# Patient Record
Sex: Female | Born: 1960
Health system: Southern US, Community
[De-identification: ages and names within clinical notes are randomized; demographics above are authoritative.]

## PROBLEM LIST (undated history)

## (undated) DIAGNOSIS — T7840XA Allergy, unspecified, initial encounter: Secondary | ICD-10-CM

## (undated) DIAGNOSIS — M810 Age-related osteoporosis without current pathological fracture: Secondary | ICD-10-CM

## (undated) HISTORY — DX: Allergy, unspecified, initial encounter: T78.40XA

## (undated) HISTORY — PX: OTHER SURGICAL HISTORY: SHX169

## (undated) HISTORY — PX: TOE SURGERY: SHX1073

---

## 2012-11-22 ENCOUNTER — Emergency Department (HOSPITAL_BASED_OUTPATIENT_CLINIC_OR_DEPARTMENT_OTHER): Payer: 59

## 2012-11-22 ENCOUNTER — Emergency Department (HOSPITAL_BASED_OUTPATIENT_CLINIC_OR_DEPARTMENT_OTHER)
Admission: EM | Admit: 2012-11-22 | Discharge: 2012-11-22 | Disposition: A | Discharge status: Home / Self Care | Payer: 59 | Attending: Emergency Medicine | Admitting: Emergency Medicine

## 2012-11-22 ENCOUNTER — Encounter (HOSPITAL_BASED_OUTPATIENT_CLINIC_OR_DEPARTMENT_OTHER): Payer: Self-pay | Admitting: *Deleted

## 2012-11-22 DIAGNOSIS — S93401A Sprain of unspecified ligament of right ankle, initial encounter: Secondary | ICD-10-CM

## 2012-11-22 DIAGNOSIS — X500XXA Overexertion from strenuous movement or load, initial encounter: Secondary | ICD-10-CM | POA: Insufficient documentation

## 2012-11-22 DIAGNOSIS — S93409A Sprain of unspecified ligament of unspecified ankle, initial encounter: Secondary | ICD-10-CM | POA: Insufficient documentation

## 2012-11-22 DIAGNOSIS — Y990 Civilian activity done for income or pay: Secondary | ICD-10-CM | POA: Insufficient documentation

## 2012-11-22 DIAGNOSIS — Y9389 Activity, other specified: Secondary | ICD-10-CM | POA: Insufficient documentation

## 2012-11-22 DIAGNOSIS — Y92009 Unspecified place in unspecified non-institutional (private) residence as the place of occurrence of the external cause: Secondary | ICD-10-CM | POA: Insufficient documentation

## 2012-11-22 MED ORDER — HYDROCODONE-ACETAMINOPHEN 5-325 MG PO TABS: 1.0000 | ORAL_TABLET | Freq: Four times a day (QID) | ORAL | Status: DC | PRN

## 2012-11-22 NOTE — ED Notes (Signed)
Pt sts she stepped on the edge of a metal object at work and twisted her right ankle. Same is now swollen and painful.

## 2012-11-22 NOTE — ED Provider Notes (Signed)
History     CSN: [id number]  Arrival date & time 11/22/12  0417   First MD Initiated Contact with Patient 11/22/12 [phone]      Chief Complaint  Patient presents with  . Ankle Injury    (Consider location/radiation/quality/duration/timing/severity/associated sxs/prior treatment) HPI This is a 52 year old woman twisted her right ankle at work several hours ago. It was initially not very painful but when she got home and took off her shoes she found it very painful to walk. The pain is moderate to severe, worse when standing. It is primarily located around the lateral malleolus with associated swelling. There is no numbness or motor dysfunction distally. She denies other injury.  History reviewed. No pertinent past medical history.  History reviewed. No pertinent past surgical history.  No family history on file.  History  Substance Use Topics  . Smoking status: Never Smoker   . Smokeless tobacco: Not on file  . Alcohol Use: Yes    OB History   Grav Para Term Preterm Abortions TAB SAB Ect Mult Living                  Review of Systems  All other systems reviewed and are negative.    Allergies  Penicillins  Home Medications  No current outpatient prescriptions on file.  BP 109/66  Pulse 74  Temp(Src) 98.1 F (36.7 C) (Oral)  Resp 16  Ht 5\' 4"  (1.626 m)  Wt 140 lb (63.504 kg)  BMI 24.02 kg/m2  SpO2 97%  Physical Exam General: Well-developed, well-nourished female in no acute distress; appearance consistent with age of record HENT: normocephalic, atraumatic Eyes: Normal appearance Neck: supple Heart: regular rate and rhythm; no murmurs, rubs or gallops Lungs: Normal respiratory effort and excursion Abdomen: soft; nondistended Extremities: Swelling and tenderness over right lateral malleolus with decreased range of motion of right ankle due to pain, right foot distally neurovascularly intact Neurologic: Awake, alert and oriented; motor function intact in all  extremities and symmetric; no facial droop Skin: Warm and dry Psychiatric: Normal mood and affect    ED Course  Procedures (including critical care time)     MDM  Nursing notes and vitals signs, including pulse oximetry, reviewed.  Summary of this visit's results, reviewed by myself:  Labs:  No results found for this or any previous visit (from the past 24 hour(s)).  Imaging Studies: Dg Ankle Complete Right  11/22/2012  *RADIOLOGY REPORT*  Clinical Data: Twisting injury to right ankle, with right lateral ankle pain.  RIGHT ANKLE - COMPLETE 3+ VIEW  Comparison: None.  Findings: There is no evidence of fracture or dislocation.  The ankle mortise is intact; the interosseous space is within normal limits.  No talar tilt or subluxation is seen.  A small posterior calcaneal spur is seen.  The joint spaces are preserved.  No significant soft tissue abnormalities are seen.  IMPRESSION: No evidence of fracture or dislocation.   Original Report Authenticated By: Tonia Ghent, M.D.             Hanley Seamen, MD 11/22/12 [phone]

## 2012-11-22 NOTE — ED Notes (Signed)
Patient transported to X-ray 

## 2013-09-09 ENCOUNTER — Encounter (HOSPITAL_BASED_OUTPATIENT_CLINIC_OR_DEPARTMENT_OTHER): Payer: Self-pay | Admitting: Emergency Medicine

## 2013-09-09 ENCOUNTER — Emergency Department (HOSPITAL_BASED_OUTPATIENT_CLINIC_OR_DEPARTMENT_OTHER)
Admission: EM | Admit: 2013-09-09 | Discharge: 2013-09-10 | Disposition: A | Discharge status: Home / Self Care | Payer: 59 | Attending: Emergency Medicine | Admitting: Emergency Medicine

## 2013-09-09 DIAGNOSIS — Z880 Allergy status to penicillin: Secondary | ICD-10-CM | POA: Insufficient documentation

## 2013-09-09 DIAGNOSIS — J0110 Acute frontal sinusitis, unspecified: Secondary | ICD-10-CM

## 2013-09-09 MED ORDER — OXYMETAZOLINE HCL 0.05 % NA SOLN: 1.0000 | Freq: Two times a day (BID) | NASAL | Status: DC

## 2013-09-09 MED ORDER — MOMETASONE FUROATE 50 MCG/ACT NA SUSP: 2.0000 | Freq: Every day | NASAL | Status: DC

## 2013-09-09 MED ORDER — IBUPROFEN 400 MG PO TABS
600.0000 mg | ORAL_TABLET | Freq: Once | ORAL | Status: AC
  Administered 2013-09-10: 600 mg via ORAL
  Filled 2013-09-09 (×2): qty 1

## 2013-09-09 MED ORDER — IBUPROFEN 600 MG PO TABS: 600.0000 mg | ORAL_TABLET | Freq: Four times a day (QID) | ORAL | Status: DC | PRN

## 2013-09-09 NOTE — ED Notes (Signed)
MD at bedside. 

## 2013-09-09 NOTE — Discharge Instructions (Signed)

## 2013-09-09 NOTE — ED Provider Notes (Signed)
CSN: [id number]     Arrival date & time 09/09/13  2151 History  This chart was scribed for [name], MD by Rolanda Lundborg, ED Scribe. This patient was seen in room MH06/[facility]06 and the patient's care was started at 11:03 PM.    Chief Complaint  Patient presents with  . URI   Patient is a 53 y.o. female presenting with URI. The history is provided by the patient. No language interpreter was used.  URI Presenting symptoms: congestion, cough, fever and sore throat   Associated symptoms: headaches   Associated symptoms: no myalgias, no neck pain and no wheezing   Headaches:    Severity:  Moderate   Duration:  1 day   Timing:  Constant   Chronicity:  New  HPI Comments: Breanna Martinez is a 53 y.o. female who presents to the Emergency Department complaining of URI symptoms including nasal and chest congestion, right-sided headache,  fever, and chills onset this afternoon. Patient has had a mild nonproductive cough. She states she was feeling fine yesterday. She also reports myalgias. Headache was gradual onset. It is constant and worse when leaning forward. She denies photophobia. Chest no nausea or vomiting. She denies any neck pain or stiffness. Patient denies any chest pain or shortness of breath. She denies any lower extremity pain or swelling.   History reviewed. No pertinent past medical history. History reviewed. No pertinent past surgical history. No family history on file. History  Substance Use Topics  . Smoking status: Never Smoker   . Smokeless tobacco: Not on file  . Alcohol Use: Yes   OB History   Grav Para Term Preterm Abortions TAB SAB Ect Mult Living                 Review of Systems  Constitutional: Positive for fever and chills.  HENT: Positive for congestion, postnasal drip, sinus pressure and sore throat.   Eyes: Negative for photophobia and visual disturbance.  Respiratory: Positive for cough. Negative for shortness of breath and wheezing.   Cardiovascular:  Negative for chest pain, palpitations and leg swelling.  Gastrointestinal: Negative for nausea, vomiting, abdominal pain and diarrhea.  Genitourinary: Negative for dysuria and flank pain.  Musculoskeletal: Negative for back pain, myalgias, neck pain and neck stiffness.  Skin: Negative for rash and wound.  Neurological: Positive for headaches. Negative for syncope, weakness and numbness.  All other systems reviewed and are negative.    Allergies  Penicillins  Home Medications   Current Outpatient Rx  Name  Route  Sig  Dispense  Refill  . HYDROcodone-acetaminophen (NORCO/VICODIN) 5-325 MG per tablet   Oral   Take 1-2 tablets by mouth every 6 (six) hours as needed for pain.   20 tablet   0   . HYDROcodone-acetaminophen (NORCO/VICODIN) 5-325 MG per tablet   Oral   Take 1-2 tablets by mouth every 6 (six) hours as needed for pain.   20 tablet   0    BP 114/76  Pulse 94  Temp(Src) 99.2 F (37.3 C) (Oral)  Resp 18  Ht 5\' 6"  (1.676 m)  Wt 137 lb (62.143 kg)  BMI 22.12 kg/m2  SpO2 99% Physical Exam  Nursing note and vitals reviewed. Constitutional: She is oriented to person, place, and time. She appears well-developed and well-nourished. No distress.  HENT:  Head: Normocephalic and atraumatic.  Mouth/Throat: Oropharynx is clear and moist. No oropharyngeal exudate.  Right greater than left nasal mucosal edema. Patient has tenderness to percussion over the right  frontal sinus.  Eyes: EOM are normal. Pupils are equal, round, and reactive to light.  Neck: Normal range of motion. Neck supple. No tracheal deviation present.  No neck stiffness or other evidence of meningismus  Cardiovascular: Normal rate and regular rhythm.  Exam reveals no gallop and no friction rub.   No murmur heard. Pulmonary/Chest: Effort normal and breath sounds normal. No respiratory distress. She has no wheezes. She has no rales. She exhibits no tenderness.  Abdominal: Soft. Bowel sounds are normal. She  exhibits no distension and no mass. There is no tenderness. There is no rebound and no guarding.  Musculoskeletal: Normal range of motion. She exhibits no edema and no tenderness.  No calf swelling or tenderness.  Neurological: She is alert and oriented to person, place, and time.  Patient is alert and oriented x3 with clear, goal oriented speech. Patient has 5/5 motor in all extremities. Sensation is intact to light touch. Patient has a normal gait and walks without assistance.   Skin: Skin is warm and dry. No rash noted. No erythema.  Psychiatric: She has a normal mood and affect. Her behavior is normal.    ED Course  Procedures (including critical care time) Medications - No data to display  DIAGNOSTIC STUDIES: Oxygen Saturation is 99% on RA, normal by my interpretation.    COORDINATION OF CARE: 11:34 PM- Discussed treatment plan with pt. Pt agrees to plan.    Labs Review Labs Reviewed - No data to display Imaging Review No results found.  EKG Interpretation   None       MDM  I personally performed the services described in this documentation, which was scribed in my presence. The recorded information has been reviewed and is accurate.  Patient's symptoms are consistent with URI and right frontal sinusitis. We'll treat with nasal decongestants and symptomatic relief. Patient been given return precautions and is voiced understanding.  [name], MD 09/10/13 [phone]

## 2013-09-09 NOTE — ED Notes (Signed)
C/o chills, sore throat, congestion x today

## 2014-10-09 ENCOUNTER — Encounter (HOSPITAL_BASED_OUTPATIENT_CLINIC_OR_DEPARTMENT_OTHER): Payer: Self-pay

## 2014-10-09 ENCOUNTER — Emergency Department (HOSPITAL_BASED_OUTPATIENT_CLINIC_OR_DEPARTMENT_OTHER)
Admission: EM | Admit: 2014-10-09 | Discharge: 2014-10-09 | Disposition: A | Discharge status: Home / Self Care | Payer: 59 | Attending: Emergency Medicine | Admitting: Emergency Medicine

## 2014-10-09 DIAGNOSIS — Y93G9 Activity, other involving cooking and grilling: Secondary | ICD-10-CM | POA: Diagnosis not present

## 2014-10-09 DIAGNOSIS — Y9289 Other specified places as the place of occurrence of the external cause: Secondary | ICD-10-CM | POA: Insufficient documentation

## 2014-10-09 DIAGNOSIS — S65503A Unspecified injury of blood vessel of left middle finger, initial encounter: Secondary | ICD-10-CM | POA: Diagnosis present

## 2014-10-09 DIAGNOSIS — Z880 Allergy status to penicillin: Secondary | ICD-10-CM | POA: Diagnosis not present

## 2014-10-09 DIAGNOSIS — Z79899 Other long term (current) drug therapy: Secondary | ICD-10-CM | POA: Insufficient documentation

## 2014-10-09 DIAGNOSIS — Y998 Other external cause status: Secondary | ICD-10-CM | POA: Insufficient documentation

## 2014-10-09 DIAGNOSIS — X58XXXA Exposure to other specified factors, initial encounter: Secondary | ICD-10-CM | POA: Diagnosis not present

## 2014-10-09 DIAGNOSIS — S65513A Laceration of blood vessel of left middle finger, initial encounter: Secondary | ICD-10-CM | POA: Insufficient documentation

## 2014-10-09 DIAGNOSIS — Z23 Encounter for immunization: Secondary | ICD-10-CM | POA: Diagnosis not present

## 2014-10-09 DIAGNOSIS — IMO0002 Reserved for concepts with insufficient information to code with codable children: Secondary | ICD-10-CM

## 2014-10-09 MED ORDER — TETANUS-DIPHTH-ACELL PERTUSSIS 5-2.5-18.5 LF-MCG/0.5 IM SUSP
0.5000 mL | Freq: Once | INTRAMUSCULAR | Status: AC
  Administered 2014-10-09: 0.5 mL via INTRAMUSCULAR
  Filled 2014-10-09: qty 0.5

## 2014-10-09 MED ORDER — DOXYCYCLINE HYCLATE 100 MG PO CAPS: 100.0000 mg | ORAL_CAPSULE | Freq: Two times a day (BID) | ORAL | Status: DC

## 2014-10-09 NOTE — ED Provider Notes (Signed)
CSN: [id number]     Arrival date & time 10/09/14  1754 History  This chart was scribed for Breanna Johns, MD by [name], ED Scribe. The patient was seen in [place] and the patient's care was started at 7:53 PM.  Chief Complaint  Patient presents with  . Finger Injury   The history is provided by the patient. No language interpreter was used.    HPI Comments: Breanna Martinez is a 54 y.o. female who presents to the Emergency Department complaining of left third finger laceration, acute onset last night while cooking dinner.  She was seen in an urgent care yesterday where she was told that it could not be sutured. Her friend looked at it today and thought that it needed sutures, so she came here.  Her finger is currently numb but she denies numbness prior to numbing medicine yesterday at the urgent care.  Her tetanus shot is not UTD.  She denies any weakness to finger.  History reviewed. No pertinent past medical history. History reviewed. No pertinent past surgical history. History reviewed. No pertinent family history. History  Substance Use Topics  . Smoking status: Never Smoker   . Smokeless tobacco: Not on file  . Alcohol Use: Yes   OB History    No data available     Review of Systems  Constitutional: Negative for fever.  Gastrointestinal: Negative for nausea and vomiting.  Musculoskeletal: Positive for joint swelling. Negative for back pain, arthralgias, gait problem and neck pain.  Skin: Positive for wound.  Neurological: Positive for numbness. Negative for weakness and headaches.    Allergies  Penicillins  Home Medications   Prior to Admission medications   Medication Sig Start Date End Date Taking? Authorizing Provider  doxycycline (VIBRAMYCIN) 100 MG capsule Take 1 capsule (100 mg total) by mouth 2 (two) times daily. One po bid x 7 days 10/09/14   Breanna Johns, MD  HYDROcodone-acetaminophen (NORCO/VICODIN) 5-325 MG per tablet Take 1-2 tablets by mouth every  6 (six) hours as needed for pain. 11/22/12   [name], MD  HYDROcodone-acetaminophen (NORCO/VICODIN) 5-325 MG per tablet Take 1-2 tablets by mouth every 6 (six) hours as needed for pain. 11/22/12   [name], MD  ibuprofen (ADVIL,MOTRIN) 600 MG tablet Take 1 tablet (600 mg total) by mouth every 6 (six) hours as needed. 09/09/13   [name], MD  mometasone (NASONEX) 50 MCG/ACT nasal spray Place 2 sprays into the nose daily. 09/09/13   [name], MD  oxymetazoline (AFRIN NASAL SPRAY) 0.05 % nasal spray Place 1 spray into both nostrils 2 (two) times daily. 09/09/13   [name], MD   BP 107/70 mmHg  Pulse 92  Temp(Src) 98.3 F (36.8 C) (Oral)  Resp 18  Ht 5\' 4"  (1.626 m)  Wt 147 lb (66.679 kg)  BMI 25.22 kg/m2  SpO2 99% Physical Exam  Constitutional: She is oriented to person, place, and time. She appears well-developed and well-nourished.  HENT:  Head: Normocephalic and atraumatic.  Neck: Normal range of motion. Neck supple.  Cardiovascular: Normal rate.   Pulmonary/Chest: Effort normal.  Musculoskeletal: She exhibits edema and tenderness.  Slight swelling around 3rd DIP joint of left hand 1cm laceration, jagged, overlying DIP joint.  Pt able to extend finger against resistance.  No tendon injury identified.  She does have some numbness to LT around lac and over distal phalanx.  No bony tenderness.  Neurological: She is alert and oriented to person, place, and time.  Skin: Skin is warm and dry.  Psychiatric: She has a normal mood and affect.    ED Course  Procedures  DIAGNOSTIC STUDIES: Oxygen Saturation is 99% on room air, normal by my interpretation.    COORDINATION OF CARE: 7:58 PM Discussed treatment plan with pt at bedside and pt agreed to plan.  Labs Review Labs Reviewed - No data to display  Imaging Review No results found.   EKG Interpretation None      MDM   Final diagnoses:  Laceration   Wound was cleaned and steri-strips were applied  across the wound. I advised the patient that given the amount of time that has elapsed since the injury that we would be unable to suture the wound. I did place her in a finger splint to give the wound support. I advised her that she can take it on and off for cleaning. I advised her she needs to keep the wound clean and wash at least once a day. I will go ahead and start her on antibiotics. She apparently has anaphylaxis to penicillin so I started her on doxycycline. I will give her referral to hand surgery if the numbness continues or she develops any weakness to the finger. I advised her to return here if she has any worsening pain or signs of infection. Her tetanus shot was updated.  I personally performed the services described in this documentation, which was scribed in my presence.  The recorded information has been reviewed and considered.       Breanna Johns, MD 10/09/14 2010

## 2014-10-09 NOTE — Discharge Instructions (Signed)

## 2014-10-09 NOTE — ED Notes (Signed)
Pt reports cutting her left third finger during meal preparation yesterday - pt reports laceration, no bleeding noted at this time, bandage in place, pt reports she was seen at [facility] yesterday and no stitches were given, pt thinks she needs stitches. Pt has not had a tetanus vaccine in the past 5 years.

## 2015-10-15 DIAGNOSIS — E119 Type 2 diabetes mellitus without complications: Secondary | ICD-10-CM | POA: Insufficient documentation

## 2015-10-15 DIAGNOSIS — R7303 Prediabetes: Secondary | ICD-10-CM | POA: Insufficient documentation

## 2016-01-06 LAB — HM DEXA SCAN

## 2016-01-12 ENCOUNTER — Encounter: Payer: Self-pay | Admitting: Podiatry

## 2016-02-19 DIAGNOSIS — M810 Age-related osteoporosis without current pathological fracture: Secondary | ICD-10-CM | POA: Insufficient documentation

## 2016-04-05 NOTE — Progress Notes (Signed)
This encounter was created in error - please disregard.

## 2016-04-16 ENCOUNTER — Ambulatory Visit: Payer: Self-pay | Admitting: Podiatry

## 2016-05-10 LAB — BASIC METABOLIC PANEL
BUN: 16 (ref 4–21)
Creatinine: 0.7 (ref <=1.1)
Glucose: 109
Potassium: 3.9 (ref 3.4–5.3)
SODIUM: 138 (ref 137–147)

## 2016-05-10 LAB — HEPATIC FUNCTION PANEL
ALT: 21 (ref 7–35)
AST: 19 (ref 13–35)
Alkaline Phosphatase: 75 (ref 25–125)

## 2016-05-10 LAB — CBC AND DIFFERENTIAL
Platelets: 293 (ref 150–399)
WBC: 6.6

## 2016-05-10 LAB — LIPID PANEL
Cholesterol: 163 (ref 0–200)
HDL: 71 — AB (ref 35–70)
LDL CALC: 73
TRIGLYCERIDES: 100 (ref 40–160)

## 2016-11-02 ENCOUNTER — Ambulatory Visit (INDEPENDENT_AMBULATORY_CARE_PROVIDER_SITE_OTHER): Payer: Federal, State, Local not specified - PPO

## 2016-11-02 ENCOUNTER — Ambulatory Visit (INDEPENDENT_AMBULATORY_CARE_PROVIDER_SITE_OTHER): Payer: Federal, State, Local not specified - PPO | Admitting: Podiatry

## 2016-11-02 VITALS — Resp 16 | Ht 67.0 in | Wt 144.0 lb

## 2016-11-02 DIAGNOSIS — M779 Enthesopathy, unspecified: Secondary | ICD-10-CM | POA: Diagnosis not present

## 2016-11-02 DIAGNOSIS — M79675 Pain in left toe(s): Secondary | ICD-10-CM

## 2016-11-02 DIAGNOSIS — D3610 Benign neoplasm of peripheral nerves and autonomic nervous system, unspecified: Secondary | ICD-10-CM

## 2016-11-02 MED ORDER — TRIAMCINOLONE ACETONIDE 10 MG/ML IJ SUSP
10.0000 mg | Freq: Once | INTRAMUSCULAR | Status: AC
  Administered 2016-11-02: 10 mg

## 2016-11-02 NOTE — Progress Notes (Signed)
Subjective:     Patient ID: Breanna Martinez, female   DOB: 06-26-1961, 56 y.o.   MRN: [id number]  HPI patient states that had a lot of pain in my left foot for about a year and I've had previous injections for neuroma which only helped temporarily and it's worse when I get up in the morning or when I walk   Review of Systems  All other systems reviewed and are negative.      Objective:   Physical Exam  Constitutional: She is oriented to person, place, and time.  Cardiovascular: Intact distal pulses.   Musculoskeletal: Normal range of motion.  Neurological: She is oriented to person, place, and time.  Skin: Skin is warm.  Nursing note and vitals reviewed.  neurovascular status intact muscle strength adequate range of motion within normal limits with patient found to have inflammatory changes around the third metatarsophalangeal joint and also some discomfort third interspace with both areas giving her different kinds of pain. Perfusion well oriented 3     Assessment:     Inflammatory condition consistent with probable inflammatory metatarsal phalangeal joint capsulitis with possibility also for neuroma pain    Plan:     H&P conditions reviewed and at this point I went ahead and I did a proximal nerve block left I aspirated third MPJ finding the fluid to be clear and injected carefully the corner cc deck Smith some Kenalog to reduce inflammation. Applied padding to reduce stress on the metatarsal joint and reappoint 2 weeks to see response  X-rays indicate slight elongation of the third metatarsal with no indication stress fracture arthritis

## 2016-11-02 NOTE — Progress Notes (Signed)
   Subjective:    Patient ID: Breanna Martinez, female    DOB: March 27, 1961, 56 y.o.   MRN: [id number]  HPI  Chief Complaint  Patient presents with  . Toe Pain    Left; 3rd-5th x "since last year". Pt stated that she fell last year while at work, has had pain in the toes since, seen a podiatrist after the incident and was dx with Neuroma.        Review of Systems     Objective:   Physical Exam        Assessment & Plan:

## 2016-11-16 ENCOUNTER — Ambulatory Visit: Payer: Federal, State, Local not specified - PPO | Admitting: Podiatry

## 2017-05-06 ENCOUNTER — Ambulatory Visit: Payer: 59 | Admitting: Family Medicine

## 2017-05-06 DIAGNOSIS — Z0289 Encounter for other administrative examinations: Secondary | ICD-10-CM

## 2017-07-08 LAB — BASIC METABOLIC PANEL
GLUCOSE: 120
POTASSIUM: 4 (ref 3.4–5.3)
SODIUM: 141 (ref 137–147)

## 2017-07-08 LAB — HEPATIC FUNCTION PANEL
ALT: 17 (ref 7–35)
AST: 22 (ref 13–35)
Alkaline Phosphatase: 64 (ref 25–125)

## 2017-07-08 LAB — LIPID PANEL
CHOLESTEROL: 176 (ref 0–200)
HDL: 67 (ref 35–70)
LDL Cholesterol: 91
Triglycerides: 88 (ref 40–160)

## 2017-08-13 ENCOUNTER — Emergency Department (HOSPITAL_BASED_OUTPATIENT_CLINIC_OR_DEPARTMENT_OTHER)
Admission: EM | Admit: 2017-08-13 | Discharge: 2017-08-14 | Disposition: A | Discharge status: Home / Self Care | Attending: Emergency Medicine | Admitting: Emergency Medicine

## 2017-08-13 ENCOUNTER — Emergency Department (HOSPITAL_BASED_OUTPATIENT_CLINIC_OR_DEPARTMENT_OTHER)

## 2017-08-13 ENCOUNTER — Other Ambulatory Visit: Payer: Self-pay

## 2017-08-13 ENCOUNTER — Encounter (HOSPITAL_BASED_OUTPATIENT_CLINIC_OR_DEPARTMENT_OTHER): Payer: Self-pay | Admitting: Emergency Medicine

## 2017-08-13 DIAGNOSIS — Y990 Civilian activity done for income or pay: Secondary | ICD-10-CM | POA: Diagnosis not present

## 2017-08-13 DIAGNOSIS — Y929 Unspecified place or not applicable: Secondary | ICD-10-CM | POA: Insufficient documentation

## 2017-08-13 DIAGNOSIS — Y939 Activity, unspecified: Secondary | ICD-10-CM | POA: Insufficient documentation

## 2017-08-13 DIAGNOSIS — X509XXA Other and unspecified overexertion or strenuous movements or postures, initial encounter: Secondary | ICD-10-CM | POA: Diagnosis not present

## 2017-08-13 DIAGNOSIS — S4991XA Unspecified injury of right shoulder and upper arm, initial encounter: Secondary | ICD-10-CM | POA: Diagnosis present

## 2017-08-13 HISTORY — DX: Age-related osteoporosis without current pathological fracture: M81.0

## 2017-08-13 NOTE — ED Triage Notes (Signed)
Pt tripped and fell at work c/o right shoulder pain.

## 2017-08-13 NOTE — ED Notes (Signed)
Pt received 50 mcg of fentanyl by EMS.

## 2017-08-14 MED ORDER — IBUPROFEN 600 MG PO TABS: 600.0000 mg | ORAL_TABLET | Freq: Four times a day (QID) | ORAL | 0 refills | Status: DC | PRN

## 2017-08-14 MED ORDER — ACETAMINOPHEN 500 MG PO TABS: 1000.0000 mg | ORAL_TABLET | Freq: Three times a day (TID) | ORAL | 0 refills | Status: AC

## 2017-08-14 MED ORDER — CYCLOBENZAPRINE HCL 5 MG PO TABS: 5.0000 mg | ORAL_TABLET | Freq: Every day | ORAL | 0 refills | Status: AC

## 2017-08-14 MED ORDER — KETOROLAC TROMETHAMINE 60 MG/2ML IM SOLN
30.0000 mg | Freq: Once | INTRAMUSCULAR | Status: AC
  Administered 2017-08-14: 30 mg via INTRAMUSCULAR
  Filled 2017-08-14: qty 2

## 2017-08-14 NOTE — ED Provider Notes (Signed)
[facility] EMERGENC[facility] DEPART[facility] Provider Note  CSN: [id number] Arrival date & time: 08/13/17 2304  Chief Complaint(s) Shoulder Injury  HPI Breanna Martinez is a 57 y.o. female patient presents to the emergency department for right shoulder pain that occurred after mechanical fall while at work.  She reports landing directly onto her right shoulder and feeling immediate pain.  Pain is exacerbated with movement and palpation of the shoulder.  Alleviated with immobilization.  Denies any pain, weakness, loss of sensation distal to the injury.  Denies any other physical complaints.  HPI  Past Medical History Past Medical History:  Diagnosis Date  . Osteoporosis    There are no active problems to display for this patient.  Home Medication(s) Prior to Admission medications   Medication Sig Start Date End Date Taking? Authorizing Provider  Calcium Citrate (CITRACAL PO) Take by mouth.    [provider]  Cholecalciferol (D3-1000) 1000 units capsule Take 1,000 Units by mouth daily.    [provider]  NAPROXEN PO Take by mouth.    [provider]  UNABLE TO FIND Med Name: Bi-Flex    [provider]                                                                                                                                    Past Surgical History History reviewed. No pertinent surgical history. Family History No family history on file.  Social History Social History   Tobacco Use  . Smoking status: Never Smoker  Substance Use Topics  . Alcohol use: Yes  . Drug use: No   Allergies Penicillins  Review of Systems Review of Systems  Musculoskeletal: Positive for joint swelling.    Physical Exam Vital Signs  I have reviewed the triage vital signs There were no vitals taken for this visit.  Physical Exam  Constitutional: She is oriented to person, place, and time. She appears well-developed and well-nourished. No distress.    HENT:  Head: Normocephalic and atraumatic.  Right Ear: External ear normal.  Left Ear: External ear normal.  Nose: Nose normal.  Eyes: Conjunctivae and EOM are normal. No scleral icterus.  Neck: Normal range of motion and phonation normal.  Cardiovascular: Normal rate and regular rhythm.  Pulmonary/Chest: Effort normal. No stridor. No respiratory distress.  Abdominal: She exhibits no distension.  Musculoskeletal: She exhibits no edema.       Right shoulder: She exhibits decreased range of motion and tenderness. She exhibits normal pulse and normal strength.  Neurological: She is alert and oriented to person, place, and time.  Skin: She is not diaphoretic.  Psychiatric: She has a normal mood and affect. Her behavior is normal.  Vitals reviewed.   ED Results and Treatments Labs (all labs ordered are listed, but only abnormal results are displayed) Labs Reviewed - No data to display  EKG  EKG Interpretation  Date/Time:    Ventricular Rate:    PR Interval:    QRS Duration:   QT Interval:    QTC Calculation:   R Axis:     Text Interpretation:        Radiology Dg Shoulder Right  Result Date: 08/14/2017 CLINICAL DATA:  Right shoulder pain after fall at work tonight. EXAM: RIGHT SHOULDER - 2+ VIEW COMPARISON:  None. FINDINGS: There is no evidence of fracture or dislocation. Mild AC and glenohumeral joint osteoarthritis with spurring. Soft tissues are unremarkable. IMPRESSION: No acute fracture nor joint dislocations. Mild osteoarthritic spurring of the AC and glenohumeral joints. Electronically Signed   By: Ashley Royalty M.D.   On: 08/14/2017 00:13   Pertinent labs & imaging results that were available during my care of the patient were reviewed by me and considered in my medical decision making (see chart for details).  Medications Ordered in ED Medications - No  data to display                                                                                                                                  Procedures Procedures  (including critical care time)  Medical Decision Making / ED Course I have reviewed the nursing notes for this encounter and the patient's prior records (if available in EHR or on provided paperwork).    Mechanical fall resulting in right shoulder pain.  Plain film without evidence of dislocation or fracture.  Likely contusion versus rotator cuff injury.  Patient provided with IM Toradol and sling.  Recommended PCP follow-up for continued pain management if needed.  Refer to sports medicine for evaluation for possible rotator cuff injury.  The patient is safe for discharge with strict return precautions.   Final Clinical Impression(s) / ED Diagnoses Final diagnoses:  None   Disposition: Discharge  Condition: Good  I have discussed the results, Dx and Tx plan with the patient who expressed understanding and agree(s) with the plan. Discharge instructions discussed at great length. The patient was given strict return precautions who verbalized understanding of the instructions. No further questions at time of discharge.    ED Discharge Orders        Ordered    acetaminophen (TYLENOL) 500 MG tablet  Every 8 hours     08/14/17 0030    ibuprofen (ADVIL,MOTRIN) 600 MG tablet  Every 6 hours PRN     08/14/17 0030    cyclobenzaprine (FLEXERIL) 5 MG tablet  Daily at bedtime     08/14/17 0030       Follow Up: [facility] [place], [place] [id number] [phone]  Schedule an appointment as soon as possible for a visit  for additional pain control  [place], Sharyn Lull, MD [address] [place] [address] [phone]  Schedule an appointment as soon as possible for a visit in 2  weeks For close follow up to assess for possible rotator cuff injury      This chart was  dictated using voice recognition software.  Despite best efforts to proofread,  errors can occur which can change the documentation meaning.   Fatima Blank, MD 08/14/17 [phone]

## 2017-08-14 NOTE — ED Notes (Signed)
Pt discharged to home with family. NAD.  

## 2017-08-26 ENCOUNTER — Encounter: Payer: Self-pay | Admitting: Cardiology

## 2017-08-27 ENCOUNTER — Encounter: Payer: Self-pay | Admitting: Family Medicine

## 2017-08-27 ENCOUNTER — Telehealth: Payer: Self-pay | Admitting: Family Medicine

## 2017-08-27 ENCOUNTER — Ambulatory Visit (INDEPENDENT_AMBULATORY_CARE_PROVIDER_SITE_OTHER): Admitting: Family Medicine

## 2017-08-27 DIAGNOSIS — S4991XA Unspecified injury of right shoulder and upper arm, initial encounter: Secondary | ICD-10-CM

## 2017-08-27 DIAGNOSIS — M25511 Pain in right shoulder: Secondary | ICD-10-CM

## 2017-08-27 MED ORDER — HYDROCODONE-ACETAMINOPHEN 5-325 MG PO TABS: 1.0000 | ORAL_TABLET | Freq: Four times a day (QID) | ORAL | 0 refills | Status: DC | PRN

## 2017-08-27 NOTE — Telephone Encounter (Signed)
Letter printed.

## 2017-08-27 NOTE — Telephone Encounter (Signed)
Left message to patient to return call.

## 2017-08-27 NOTE — Telephone Encounter (Signed)
Patient requesting a note to be excused from work today. States she is in pain and is going to take Norco that was prescribed today. Patient is concerned about working on the medication.

## 2017-08-27 NOTE — Patient Instructions (Signed)
I'm concerned you tore your rotator cuff based on your exam and bedside ultrasound. We will go ahead with an MRI to further assess. You have rotator cuff impingement Try to avoid painful activities (overhead activities, lifting with extended arm) as much as possible. Ibuprofen 600mg  three times a day with food for pain and inflammation. Tylenol during the day 500mg  1-2 tabs breakfast and lunch. Norco as needed for severe pain (don't take with tylenol in the evenings because norco has tylenol in it) Ok to take flexeril if needed with these as well. Follow up will depend on the MRI results.

## 2017-08-28 ENCOUNTER — Encounter: Payer: Self-pay | Admitting: Family Medicine

## 2017-08-28 DIAGNOSIS — S4991XD Unspecified injury of right shoulder and upper arm, subsequent encounter: Secondary | ICD-10-CM | POA: Insufficient documentation

## 2017-08-28 NOTE — Progress Notes (Addendum)
PCP: Patient, No Pcp Per  Subjective:   HPI: Patient is a 57 y.o. female here for right shoulder injury.  Patient reports on 1/8 she was coming up the steps at work when she tripped on something that was covering the ground (snow tarp or something similar) and fell directly onto her right side. Immediate pain lateral shoulder and couldn't move arm. Pain is 5/10 at rest but sharp and worse if trying to reach or lift something. Has continued to work with this. Taking ibuprofen, tylenol, and flexeril. No skin changes, numbness. Right handed.  Past Medical History:  Diagnosis Date  . Osteoporosis     Current Outpatient Medications on File Prior to Visit  Medication Sig Dispense Refill  . Calcium Citrate (CITRACAL PO) Take by mouth.    . Cholecalciferol (D3-1000) 1000 units capsule Take 1,000 Units by mouth daily.    . Multiple Vitamins-Minerals (MULTIVITAMIN ADULT EXTRA C PO) multivitamin  Take 1 tablet as directed by oral route     No current facility-administered medications on file prior to visit.     History reviewed. No pertinent surgical history.  Allergies  Allergen Reactions  . Penicillins Anaphylaxis    Social History   Socioeconomic History  . Marital status: Single    Spouse name: Not on file  . Number of children: Not on file  . Years of education: Not on file  . Highest education level: Not on file  Social Needs  . Financial resource strain: Not on file  . Food insecurity - worry: Not on file  . Food insecurity - inability: Not on file  . Transportation needs - medical: Not on file  . Transportation needs - non-medical: Not on file  Occupational History  . Not on file  Tobacco Use  . Smoking status: Never Smoker  . Smokeless tobacco: Never Used  Substance and Sexual Activity  . Alcohol use: Yes  . Drug use: No  . Sexual activity: Not on file  Other Topics Concern  . Not on file  Social History Narrative  . Not on file    History reviewed. No  pertinent family history.  BP 128/86   Pulse 86   Ht 5\' 7"  (1.702 m)   Wt 138 lb (62.6 kg)   BMI 21.61 kg/m   Review of Systems: See HPI above.     Objective:  Physical Exam:  Gen: NAD, comfortable in exam room  Right shoulder: No swelling, ecchymoses.  No gross deformity. TTP lateral and anterior shoulder. ROM limited to 45 degrees active flexion, 60 degrees active abduction, 20 degrees active external rotation.  Full passive motion. Positive Hawkins, Neers. Negative Yergasons. Strength 3/5 with empty can, 5/5 resisted internal/external rotation.  Pain all three motions. NV intact distally.  Left shoulder: No swelling, ecchymoses.  No gross deformity. No TTP. FROM. Strength 5/5 with empty can and resisted internal/external rotation. NV intact distally.  MSK u/s right shoulder:  Biceps tendon intact but with target sign.  Subscapularis appears normal, infraspinatus appears normal but thinner than expected.  AC joint without abnormalities.  Supraspinatus with anechoic area at insertion as well as calcific changes on footplate suspicious for tear.   Assessment & Plan:  1. Right shoulder injury - concerning for rotator cuff tear, specifically of supraspinatus with anechoic area also noted on bedside ultrasound.  Advised we go ahead with MRI to further assess to determine if surgical intervention is needed.  In meantime ibuprofen, tylenol with norco for severe pain.  Flexeril  if needed.  F/u will depend on MRI results.  Addendum:  MRI reviewed and discussed with patient.  As expected she does have partial tears of distal supraspinatus with small area of full thickness tears, partial tears of infraspinatus which would account for thin appearance of this tendon.  Given amount of time since her last visit advised she come back to see [place] to repeat her examination (scheduled 3/11) and discuss options.

## 2017-08-28 NOTE — Assessment & Plan Note (Signed)
concerning for rotator cuff tear, specifically of supraspinatus with anechoic area also noted on bedside ultrasound.  Advised we go ahead with MRI to further assess to determine if surgical intervention is needed.  In meantime ibuprofen, tylenol with norco for severe pain.  Flexeril if needed.  F/u will depend on MRI results.

## 2017-08-28 NOTE — Telephone Encounter (Signed)
Patient picked up note 

## 2017-08-30 ENCOUNTER — Encounter: Payer: Self-pay | Admitting: Family Medicine

## 2017-08-30 ENCOUNTER — Ambulatory Visit: Payer: POS | Admitting: Family Medicine

## 2017-08-30 VITALS — BP 100/70 | HR 80 | Temp 98.5°F | Wt 139.9 lb

## 2017-08-30 DIAGNOSIS — Z7689 Persons encountering health services in other specified circumstances: Secondary | ICD-10-CM

## 2017-08-30 DIAGNOSIS — M818 Other osteoporosis without current pathological fracture: Secondary | ICD-10-CM

## 2017-08-30 NOTE — Progress Notes (Signed)
Patient presents to clinic today to establish care.  SUBJECTIVE: PMH: Pt is a 57 yo female with pmh sig for osteoporosis.  Pt was previously seen at [facility] in [place], [place].  Patient is followed by [facility]/and infertility.  Last physical was October 2018.  Shoulder injury, acute: -pt had a fall at work injuring her right shoulder. -pt has been seen by Dr. Barbaraann Barthel, sports medicine -Bedside ultrasound with anechoic area on supraspinatus. -Patient currently wearing shoulders sling until further MRI can be done of her shoulder to determine the possible tear of rotator cuff is present -Currently taking ibuprofen.  Patient to take Norco 5-325 mg for severe pain  Allergies: Penicillin-passed out  Past surgical history: Neuroma removal left foot  Social history: Patient is single.  Patient is originally from [place].  She has her elderly mother living with her.  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  Patient denies tobacco or drug use.  Patient endorses rare alcohol use.  Family medical history: Mom-alive, DM, questionable dementia or mild mental illness Dad-deceased, prostate cancer Sister, Maria-alive, DM Sister-Marriam, alive asthma Sister-Lina, alive, asthma Brother-Mark, alive, DM Brother-George Brother-Diane  Health Maintenance: Dental --[facility] Vision --[facility] Colonoscopy --7 years ago Mammogram --last year PAP --last year Bone Density --gets bone scan every year   Past Medical History:  Diagnosis Date  . Osteoporosis     Past Surgical History:  Procedure Laterality Date  . TOE SURGERY      Current Outpatient Medications on File Prior to Visit  Medication Sig Dispense Refill  . Calcium Citrate (CITRACAL PO) Take by mouth.    . Cholecalciferol (D3-1000) 1000 units capsule Take 1,000 Units by mouth daily.    Marland Kitchen HYDROcodone-acetaminophen (NORCO) 5-325 MG tablet Take 1 tablet by mouth every 6 (six) hours as  needed for moderate pain. 20 tablet 0  . Multiple Vitamins-Minerals (MULTIVITAMIN ADULT EXTRA C PO) multivitamin  Take 1 tablet as directed by oral route     No current facility-administered medications on file prior to visit.     Allergies  Allergen Reactions  . Penicillins Anaphylaxis    History reviewed. No pertinent family history.  Social History   Socioeconomic History  . Marital status: Single    Spouse name: Not on file  . Number of children: Not on file  . Years of education: Not on file  . Highest education level: Not on file  Social Needs  . Financial resource strain: Not on file  . Food insecurity - worry: Not on file  . Food insecurity - inability: Not on file  . Transportation needs - medical: Not on file  . Transportation needs - non-medical: Not on file  Occupational History  . Not on file  Tobacco Use  . Smoking status: Never Smoker  . Smokeless tobacco: Never Used  Substance and Sexual Activity  . Alcohol use: Yes    Comment: socially   . Drug use: No  . Sexual activity: Not on file  Other Topics Concern  . Not on file  Social History Narrative  . Not on file    ROS General: Denies fever, chills, night sweats, changes in weight, changes in appetite HEENT: Denies headaches, ear pain, changes in vision, rhinorrhea, sore throat CV: Denies CP, palpitations, SOB, orthopnea Pulm: Denies SOB, cough, wheezing GI: Denies abdominal pain, nausea, vomiting, diarrhea, constipation GU: Denies dysuria, hematuria, frequency, vaginal discharge Msk: Denies muscle cramps, joint pains   +R  shoulder pain Neuro: Denies weakness, numbness, tingling Skin: Denies rashes, bruising Psych: Denies depression, anxiety, hallucinations  BP 100/70 (BP Location: Left Arm, Patient Position: Sitting, Cuff Size: Normal)   Pulse 80   Temp 98.5 F (36.9 C) (Oral)   Wt 139 lb 14.4 oz (63.5 kg)   BMI 21.91 kg/m   Physical Exam Gen. Pleasant, well developed, well-nourished,  in NAD HEENT - [place]/AT, PERRL, no scleral icterus, no nasal drainage, pharynx without erythema or exudate. Lungs: no use of accessory muscles, CTAB, no wheezes, rales or rhonchi Cardiovascular: RRR, No r/g/m, no peripheral edema Abdomen: BS present, soft, nontender,nondistended Musculoskeletal: Right arm patient side bent at the elbow with forearm resting on abdomen.  No deformities, moves all four extremities, no cyanosis or clubbing Neuro:  A&Ox3, CN II-XII intact, normal gait Skin:  Warm, dry, intact, no lesions Psych: normal affect, mood appropriate  No results found for this or any previous visit (from the past 2160 hour(s)).  Assessment/Plan: Other osteoporosis, unspecified pathological fracture presence -Continue given Ibandronate 150 mg monthly and Citracal -Weightbearing exercises encouraged  Encounter to establish care -We reviewed the PMH, PSH, FH, SH, Meds and Allergies. -We provided refills for any medications we will prescribe as needed. -We addressed current concerns per orders and patient instructions. -We have asked for records for pertinent exams, studies, vaccines and notes from previous providers. -We have advised patient to follow up per instructions below.   F/u prn  Grier Mitts, MD

## 2017-08-30 NOTE — Patient Instructions (Addendum)
Osteoporosis Osteoporosis is the thinning and loss of density in the bones. Osteoporosis makes the bones more brittle, fragile, and likely to break (fracture). Over time, osteoporosis can cause the bones to become so weak that they fracture after a simple fall. The bones most likely to fracture are the bones in the hip, wrist, and spine. What are the causes? The exact cause is not known. What increases the risk? Anyone can develop osteoporosis. You may be at greater risk if you have a family history of the condition or have poor nutrition. You may also have a higher risk if you are:  Female.  50 years old or older.  A smoker.  Not physically active.  White or Asian.  Slender.  What are the signs or symptoms? A fracture might be the first sign of the disease, especially if it results from a fall or injury that would not usually cause a bone to break. Other signs and symptoms include:  Low back and neck pain.  Stooped posture.  Height loss.  How is this diagnosed? To make a diagnosis, your health care provider may:  Take a medical history.  Perform a physical exam.  Order tests, such as: ? A bone mineral density test. ? A dual-energy X-ray absorptiometry test.  How is this treated? The goal of osteoporosis treatment is to strengthen your bones to reduce your risk of a fracture. Treatment may involve:  Making lifestyle changes, such as: ? Eating a diet rich in calcium. ? Doing weight-bearing and muscle-strengthening exercises. ? Stopping tobacco use. ? Limiting alcohol intake.  Taking medicine to slow the process of bone loss or to increase bone density.  Monitoring your levels of calcium and vitamin D.  Follow these instructions at home:  Include calcium and vitamin D in your diet. Calcium is important for bone health, and vitamin D helps the body absorb calcium.  Perform weight-bearing and muscle-strengthening exercises as directed by your health care  provider.  Do not use any tobacco products, including cigarettes, chewing tobacco, and electronic cigarettes. If you need help quitting, ask your health care provider.  Limit your alcohol intake.  Take medicines only as directed by your health care provider.  Keep all follow-up visits as directed by your health care provider. This is important.  Take precautions at home to lower your risk of falling, such as: ? Keeping rooms well lit and clutter free. ? Installing safety rails on stairs. ? Using rubber mats in the bathroom and other areas that are often wet or slippery. Get help right away if: You fall or injure yourself. This information is not intended to replace advice given to you by your health care provider. Make sure you discuss any questions you have with your health care provider. Document Released: 05/02/2005 Document Revised: 12/26/2015 Document Reviewed: 12/31/2013 Elsevier Interactive Patient Education  2018 Elsevier Inc.  

## 2017-09-03 ENCOUNTER — Encounter: Payer: Self-pay | Admitting: Family Medicine

## 2017-09-04 ENCOUNTER — Encounter: Payer: Self-pay | Admitting: Family Medicine

## 2017-09-10 ENCOUNTER — Telehealth: Payer: Self-pay | Admitting: Family Medicine

## 2017-09-10 NOTE — Telephone Encounter (Signed)
Ok, thanks.

## 2017-09-10 NOTE — Telephone Encounter (Signed)
Patient called with a couple questions.   She is currently wearing her sling to work, but she is right handed so the sling is making it difficult for her to move around. She would like to know if she needs to continue wearing the sling.  Additionally, patient asked if she should continue taking the Norco Rx. She is currently only taking medication at bedtime, but received a letter from [place] Department [employer] regarding controlled substance use. (letter was placed in office yesterday)

## 2017-09-10 NOTE — Telephone Encounter (Signed)
Relayed message to patient regarding Norco Rx and wearing the sling.  Spoke to the Department of Labor regarding MRI and her W/C case is pending. Once approved we will need to resubmit request for MRI. Patient will receive letter stating approval or denial.  Patient was aware of pending case and understands MRI cannot be done without approved case or filing personal insurance.

## 2017-09-10 NOTE — Telephone Encounter (Signed)
I got that letter too and it's ok for her to take the hydrocodone as needed.  We don't refill this and are aware of the risks if you take it for prolonged periods - she's only been given 20 tablets for that reason.  It's ok for her to stop using the sling if needed but it usually serves as a deterrent from her accidentally moving it above shoulder level where she could cause more pain.  Has she heard anything about the MRI for her shoulder yet?

## 2017-10-03 NOTE — Addendum Note (Signed)
Addended by: Sherrie George F on: 10/03/2017 08:46 AM   Modules accepted: Orders

## 2017-10-14 ENCOUNTER — Encounter: Payer: Self-pay | Admitting: Family Medicine

## 2017-10-14 ENCOUNTER — Ambulatory Visit (INDEPENDENT_AMBULATORY_CARE_PROVIDER_SITE_OTHER): Admitting: Family Medicine

## 2017-10-14 VITALS — BP 108/69 | HR 76 | Ht 64.0 in | Wt 136.0 lb

## 2017-10-14 DIAGNOSIS — S4991XD Unspecified injury of right shoulder and upper arm, subsequent encounter: Secondary | ICD-10-CM | POA: Diagnosis not present

## 2017-10-14 NOTE — Progress Notes (Signed)
PCP: Billie Ruddy, MD  Subjective:   HPI: Patient is a 57 y.o. female here for right shoulder injury.  1/22: Patient reports on 1/8 she was coming up the steps at work when she tripped on something that was covering the ground (snow tarp or something similar) and fell directly onto her right side. Immediate pain lateral shoulder and couldn't move arm. Pain is 5/10 at rest but sharp and worse if trying to reach or lift something. Has continued to work with this. Taking ibuprofen, tylenol, and flexeril. No skin changes, numbness. Right handed.  3/11: Patient reports she's doing worse than last visit. Pain level is 9/10 and sharp lateral right shoulder. Wearing sling at work. Difficulty trying to reach or go overhead. No skin changes, numbness.  Past Medical History:  Diagnosis Date  . Osteoporosis     Current Outpatient Medications on File Prior to Visit  Medication Sig Dispense Refill  . Calcium Citrate (CITRACAL PO) Take by mouth.    . Cholecalciferol (D3-1000) 1000 units capsule Take 1,000 Units by mouth daily.    . Multiple Vitamins-Minerals (MULTIVITAMIN ADULT EXTRA C PO) multivitamin  Take 1 tablet as directed by oral route     No current facility-administered medications on file prior to visit.     Past Surgical History:  Procedure Laterality Date  . TOE SURGERY      Allergies  Allergen Reactions  . Penicillins Anaphylaxis    Social History   Socioeconomic History  . Marital status: Single    Spouse name: Not on file  . Number of children: Not on file  . Years of education: Not on file  . Highest education level: Not on file  Social Needs  . Financial resource strain: Not on file  . Food insecurity - worry: Not on file  . Food insecurity - inability: Not on file  . Transportation needs - medical: Not on file  . Transportation needs - non-medical: Not on file  Occupational History  . Not on file  Tobacco Use  . Smoking status: Never Smoker  .  Smokeless tobacco: Never Used  Substance and Sexual Activity  . Alcohol use: Yes    Comment: socially   . Drug use: No  . Sexual activity: Not on file  Other Topics Concern  . Not on file  Social History Narrative  . Not on file    History reviewed. No pertinent family history.  BP 108/69   Pulse 76   Ht 5\' 4"  (1.626 m)   Wt 136 lb (61.7 kg)   BMI 23.34 kg/m   Review of Systems: See HPI above.     Objective:  Physical Exam:  Gen: NAD, comfortable in exam room.  Right shoulder: No swelling, ecchymoses.  No gross deformity. No TTP. ROM limited to 30 degrees abduction and flexion, 20 degrees ER.  Full passive motion. Positive Hawkins, Neers. Negative Yergasons. Strength 3/5 with empty can, 5-/5 resisted ER, both painful.  5/5 IR without pain. NV intact distally.   Assessment & Plan:  1. Right shoulder injury - Ultrasound and MRI consistent with tears of supraspinatus and infraspinatus with evidence full thickness component of supraspinatus tears.  Examination consistent with these, caused by her work injury.  Recommend we go ahead with ortho referral for rotator cuff repair.  Ibuprofen as needed in meantime.

## 2017-10-14 NOTE — Patient Instructions (Signed)
We will go ahead with a referral to orthopedic surgeon to discuss and schedule rotator cuff repair for you. Continue ibuprofen as you have been. Ok to take tylenol and use a topical medication with this if needed also.

## 2017-10-14 NOTE — Assessment & Plan Note (Signed)
Ultrasound and MRI consistent with tears of supraspinatus and infraspinatus with evidence full thickness component of supraspinatus tears.  Examination consistent with these, caused by her work injury.  Recommend we go ahead with ortho referral for rotator cuff repair.  Ibuprofen as needed in meantime.

## 2017-10-16 ENCOUNTER — Encounter: Payer: Self-pay | Admitting: Family Medicine

## 2017-10-21 ENCOUNTER — Telehealth: Payer: Self-pay | Admitting: Family Medicine

## 2017-10-21 NOTE — Telephone Encounter (Signed)
Needs a referral to an orthopedic doctor for her rotator cuff (?, hard to understand her) Please call her at [phone]

## 2017-10-24 ENCOUNTER — Telehealth: Payer: Self-pay | Admitting: Family Medicine

## 2017-10-24 NOTE — Telephone Encounter (Signed)
Ok, thank you

## 2017-10-24 NOTE — Telephone Encounter (Signed)
Referral to orthopedic surgeon has been approved through worker's compensation. Patient requested to go to [facility]. I spoke with [facility] and they do accept W/C. Office will contact patient and schedule an appointment after office notes are reviewed.

## 2017-10-24 NOTE — Telephone Encounter (Signed)
See most recent phone note.

## 2017-12-25 ENCOUNTER — Encounter: Payer: Self-pay | Admitting: Cardiology

## 2017-12-25 ENCOUNTER — Ambulatory Visit: Payer: POS

## 2017-12-25 ENCOUNTER — Ambulatory Visit (INDEPENDENT_AMBULATORY_CARE_PROVIDER_SITE_OTHER): Payer: POS | Admitting: Cardiology

## 2017-12-25 VITALS — BP 102/68 | HR 76 | Ht 66.0 in | Wt 142.0 lb

## 2017-12-25 DIAGNOSIS — I491 Atrial premature depolarization: Secondary | ICD-10-CM | POA: Insufficient documentation

## 2017-12-25 DIAGNOSIS — R7303 Prediabetes: Secondary | ICD-10-CM

## 2017-12-25 NOTE — Patient Instructions (Addendum)
Medication Instructions:  Your physician recommends that you continue on your current medications as directed. Please refer to the Current Medication list given to you today.   Labwork: None  Testing/Procedures: You had an EKG today.   Your physician has requested that you have an echocardiogram. Echocardiography is a painless test that uses sound waves to create images of your heart. It provides your doctor with information about the size and shape of your heart and how well your heart's chambers and valves are working. This procedure takes approximately one hour. There are no restrictions for this procedure.  Your physician has recommended that you wear a holter monitor. Holter monitors are medical devices that record the heart's electrical activity. Doctors most often use these monitors to diagnose arrhythmias. Arrhythmias are problems with the speed or rhythm of the heartbeat. The monitor is a small, portable device. You can wear one while you do your normal daily activities. This is usually used to diagnose what is causing palpitations/syncope (passing out). Wear for 24 hours.  Follow-Up: Your physician recommends that you schedule a follow-up appointment in: 1 month.  If you need a refill on your cardiac medications before your next appointment, please call your pharmacy.   Thank you for choosing [facility]! Robyne Peers, RN [phone]

## 2017-12-25 NOTE — Progress Notes (Signed)
Cardiology Consultation:    Date:  12/25/2017   ID:  Rosendo Gros, DOB 12/10/1960, MRN [id number]  PCP:  Billie Ruddy, MD  Cardiologist:  Jenne Campus, MD   Referring MD: Billie Ruddy, MD   Chief Complaint  Patient presents with  . Abnormal ECG  . Irregular Heart Rate  . Pre-op Exam  I need to have surgery  History of Present Illness:    Breanna Martinez is a 57 y.o. female who is being seen today for the evaluation of supraventricular ectopy at the request of Billie Ruddy, MD.  She sustained a fall in January she ended up having a right rotator cuff injury.  She was scheduled to surgery she was seen by anesthesia before she was felt to have irregular heart rate EKG has been done which showed what appears to be ectopic atrial rhythm with APCs.  Normal QS complex duration morphology no ST segment changes.  Surgery was consulted and she was sent to [place] for evaluation.  She is doing well denies have any cardiac complaint there is no shortness of breath chest pain tightness squeezing pressure burning chest no dizziness no passing out.  She is completely unaware of having any heart issue.  She said that she is healthy she goes to gym on the regular basis and exercise . Past Medical History:  Diagnosis Date  . Osteoporosis     Past Surgical History:  Procedure Laterality Date  . TOE SURGERY      Current Medications: Current Meds  Medication Sig  . Calcium Citrate (CITRACAL PO) Take by mouth.  . Chlorpheniramine-Phenylephrine (SUDAFED PE SINUS/ALLERGY PO) Take 1 tablet by mouth as needed.  . Cholecalciferol (D3-1000) 1000 units capsule Take 1,000 Units by mouth daily.  . Multiple Vitamins-Minerals (MULTIVITAMIN ADULT EXTRA C PO) multivitamin  Take 1 tablet as directed by oral route  . SALINE NASAL MIST NA Place into the nose as needed.     Allergies:   Penicillins   Social History   Socioeconomic History  . Marital status: Single    Spouse name: Not  on file  . Number of children: Not on file  . Years of education: Not on file  . Highest education level: Not on file  Occupational History  . Not on file  Social Needs  . Financial resource strain: Not on file  . Food insecurity:    Worry: Not on file    Inability: Not on file  . Transportation needs:    Medical: Not on file    Non-medical: Not on file  Tobacco Use  . Smoking status: Never Smoker  . Smokeless tobacco: Never Used  Substance and Sexual Activity  . Alcohol use: Yes    Comment: socially   . Drug use: No  . Sexual activity: Not on file  Lifestyle  . Physical activity:    Days per week: Not on file    Minutes per session: Not on file  . Stress: Not on file  Relationships  . Social connections:    Talks on phone: Not on file    Gets together: Not on file    Attends religious service: Not on file    Active member of club or organization: Not on file    Attends meetings of clubs or organizations: Not on file    Relationship status: Not on file  Other Topics Concern  . Not on file  Social History Narrative  . Not on file  Family History: The patient's family history includes Diabetes in her mother; Glaucoma in her mother; Prostate cancer in her father. ROS:   Please see the history of present illness.    All 14 point review of systems negative except as described per history of present illness.  EKGs/Labs/Other Studies Reviewed:    The following studies were reviewed today:  EKG showed ectopic atrial rhythm with APCs.  Normal QS complex duration morphology no ST segment changes  EKG:  EKG is  ordered today.  The ekg ordered today demonstrates normal sinus rhythm normal P interval normal QS complex duration morphology.  No ectopy  Recent Labs: 07/08/2017: ALT 17; Potassium 4.0; Sodium 141  Recent Lipid Panel    Component Value Date/Time   CHOL 176 07/08/2017   TRIG 88 07/08/2017   HDL 67 07/08/2017   LDLCALC 91 07/08/2017    Physical Exam:     VS:  BP 102/68   Pulse 76   Ht 5\' 6"  (1.676 m)   Wt 142 lb (64.4 kg)   SpO2 98%   BMI 22.92 kg/m     Wt Readings from Last 3 Encounters:  12/25/17 142 lb (64.4 kg)  10/14/17 136 lb (61.7 kg)  08/30/17 139 lb 14.4 oz (63.5 kg)     GEN:  Well nourished, well developed in no acute distress HEENT: Normal NECK: No JVD; No carotid bruits LYMPHATICS: No lymphadenopathy CARDIAC: RRR, no murmurs, no rubs, no gallops RESPIRATORY:  Clear to auscultation without rales, wheezing or rhonchi  ABDOMEN: Soft, non-tender, non-distended MUSCULOSKELETAL:  No edema; No deformity  SKIN: Warm and dry NEUROLOGIC:  Alert and oriented x 3 PSYCHIATRIC:  Normal affect   ASSESSMENT:    1. Prediabetes   2. Premature supraventricular beats    PLAN:    In order of problems listed above:  1. Premature supraventricular beats with ectopic atrial rhythm.  I will ask her to have repeated EKG today.  I will make sure she will wear Holter monitor for 24 hours.  As a part of evaluation we will get echocardiogram to assess left ventricular ejection fraction.  I suspect that part of the problem was that she was taking some nasal decongestant at the time of EKG being done.  Overall she is asymptomatic denies have any cardiac complaints. 2. Prediabetes: That being followed by internal medicine team. 3. Cholesterol status for a cholesterol is excellent HDL is high more than 70.  Rollator referred to [place] because of arrhythmia for evaluation before surgery will get Holter monitor 24 hours as well as echocardiogram if those 2 tests negative then we can proceed with surgery as scheduled.   Medication Adjustments/Labs and Tests Ordered: Current medicines are reviewed at length with the patient today.  Concerns regarding medicines are outlined above.  No orders of the defined types were placed in this encounter.  No orders of the defined types were placed in this encounter.   Signed, Park Liter, MD,  [facility]. 12/25/2017 11:46 AM    [place]

## 2017-12-27 ENCOUNTER — Ambulatory Visit (HOSPITAL_COMMUNITY): Payer: POS | Attending: Cardiology

## 2017-12-27 ENCOUNTER — Other Ambulatory Visit: Payer: Self-pay

## 2017-12-27 DIAGNOSIS — Z01818 Encounter for other preprocedural examination: Secondary | ICD-10-CM | POA: Diagnosis not present

## 2017-12-27 DIAGNOSIS — I491 Atrial premature depolarization: Secondary | ICD-10-CM

## 2017-12-31 ENCOUNTER — Encounter (INDEPENDENT_AMBULATORY_CARE_PROVIDER_SITE_OTHER): Payer: Self-pay

## 2018-01-01 ENCOUNTER — Encounter: Payer: Self-pay | Admitting: *Deleted

## 2018-01-01 ENCOUNTER — Telehealth: Payer: Self-pay | Admitting: Cardiology

## 2018-01-01 NOTE — Telephone Encounter (Signed)
Informed patient of 24 hour holter monitor results and advised her that Dr. Agustin Cree has cleared her for surgery. Cardiac clearance letter has been sent to Dr. Bettina Gavia office. Patient verbalized understanding. No further questions.

## 2018-01-01 NOTE — Telephone Encounter (Signed)
Patient wanted to see if her 24 hour holter monitor results were back yet. Advised patient that we have not received these results yet and I would let her know as soon as Dr. Agustin Cree has reviewed them to let her know about cardiac clearance for right rotator cuff surgery being performed by Dr. Tamera Punt. Patient verbalized understanding. No further questions.

## 2018-01-01 NOTE — Telephone Encounter (Signed)
Wants to know if she is cleared for surgery

## 2018-01-02 ENCOUNTER — Encounter: Payer: Self-pay | Admitting: *Deleted

## 2018-01-02 NOTE — Telephone Encounter (Signed)
Opened in error to do surgery clearance note however pt has already been cleared in a prior phone note.

## 2018-01-27 ENCOUNTER — Ambulatory Visit (INDEPENDENT_AMBULATORY_CARE_PROVIDER_SITE_OTHER): Payer: POS | Admitting: Cardiology

## 2018-01-27 ENCOUNTER — Encounter: Payer: Self-pay | Admitting: Cardiology

## 2018-01-27 VITALS — BP 102/62 | HR 65 | Ht 66.0 in | Wt 145.4 lb

## 2018-01-27 DIAGNOSIS — I471 Supraventricular tachycardia: Secondary | ICD-10-CM | POA: Diagnosis not present

## 2018-01-27 DIAGNOSIS — I491 Atrial premature depolarization: Secondary | ICD-10-CM

## 2018-01-27 DIAGNOSIS — R7303 Prediabetes: Secondary | ICD-10-CM

## 2018-01-27 NOTE — Progress Notes (Signed)
Cardiology Office Note:    Date:  01/27/2018   ID:  Rosendo Gros, DOB 1960/10/06, MRN [id number]  PCP:  Billie Ruddy, MD  Cardiologist:  Jenne Campus, MD    Referring MD: Billie Ruddy, MD   Chief Complaint  Patient presents with  . 1 month follow up  Doing great cardiac standpoint review  History of Present Illness:    Breanna Martinez is a 57 y.o. female with supraventricular ectopy and short runs of atrial tachycardia.  She did have echocardiogram which showed preserved left ventricular ejection fraction she is doing much better right now no palpitations she did went to shoulder surgery few days ago and recovering from it doing well no complication of surgery from cardiac standpoint review.  We realized that most likely triggering factor for her palpitations was excessive coffee as well as sleep deprivation.  She eliminated coffee and she sleeps much better now  Past Medical History:  Diagnosis Date  . Osteoporosis     Past Surgical History:  Procedure Laterality Date  . TOE SURGERY      Current Medications: Current Meds  Medication Sig  . acetaminophen (TYLENOL) 500 MG tablet Take 500 mg by mouth every 6 (six) hours as needed for moderate pain.  . Calcium Citrate (CITRACAL PO) Take by mouth.  . Chlorpheniramine-Phenylephrine (SUDAFED PE SINUS/ALLERGY PO) Take 1 tablet by mouth as needed.  . Cholecalciferol (D3-1000) 1000 units capsule Take 1,000 Units by mouth daily.  . Multiple Vitamins-Minerals (MULTIVITAMIN ADULT EXTRA C PO) multivitamin  Take 1 tablet as directed by oral route  . SALINE NASAL MIST NA Place into the nose as needed.     Allergies:   Penicillins   Social History   Socioeconomic History  . Marital status: Single    Spouse name: Not on file  . Number of children: Not on file  . Years of education: Not on file  . Highest education level: Not on file  Occupational History  . Not on file  Social Needs  . Financial resource  strain: Not on file  . Food insecurity:    Worry: Not on file    Inability: Not on file  . Transportation needs:    Medical: Not on file    Non-medical: Not on file  Tobacco Use  . Smoking status: Never Smoker  . Smokeless tobacco: Never Used  Substance and Sexual Activity  . Alcohol use: Yes    Comment: socially   . Drug use: No  . Sexual activity: Not on file  Lifestyle  . Physical activity:    Days per week: Not on file    Minutes per session: Not on file  . Stress: Not on file  Relationships  . Social connections:    Talks on phone: Not on file    Gets together: Not on file    Attends religious service: Not on file    Active member of club or organization: Not on file    Attends meetings of clubs or organizations: Not on file    Relationship status: Not on file  Other Topics Concern  . Not on file  Social History Narrative  . Not on file     Family History: The patient's family history includes Diabetes in her mother; Glaucoma in her mother; Prostate cancer in her father. ROS:   Please see the history of present illness.    All 14 point review of systems negative except as described per history of present  illness  EKGs/Labs/Other Studies Reviewed:      Recent Labs: 07/08/2017: ALT 17; Potassium 4.0; Sodium 141  Recent Lipid Panel    Component Value Date/Time   CHOL 176 07/08/2017   TRIG 88 07/08/2017   HDL 67 07/08/2017   LDLCALC 91 07/08/2017    Physical Exam:    VS:  BP 102/62   Pulse 65   Ht 5\' 6"  (1.676 m)   Wt 145 lb 6.4 oz (66 kg)   SpO2 97%   BMI 23.47 kg/m     Wt Readings from Last 3 Encounters:  01/27/18 145 lb 6.4 oz (66 kg)  12/25/17 142 lb (64.4 kg)  10/14/17 136 lb (61.7 kg)     GEN:  Well nourished, well developed in no acute distress HEENT: Normal NECK: No JVD; No carotid bruits LYMPHATICS: No lymphadenopathy CARDIAC: RRR, no murmurs, no rubs, no gallops RESPIRATORY:  Clear to auscultation without rales, wheezing or rhonchi   ABDOMEN: Soft, non-tender, non-distended MUSCULOSKELETAL:  No edema; No deformity  SKIN: Warm and dry LOWER EXTREMITIES: no swelling NEUROLOGIC:  Alert and oriented x 3 PSYCHIATRIC:  Normal affect   ASSESSMENT:    1. Premature supraventricular beats   2. Prediabetes   3. Atrial tachycardia ([place])    PLAN:    In order of problems listed above:  1. Premature supraventricular beats: Plan as outlined above she eliminated coffee she sleeps better does not have any more palpitations.  We will continue monitoring I talked about potentially using some medication like beta-blocker but since she is doing well right now I would continue present management 2. Prediabetes: Diet and exercises will continue 3. Atrial tachycardia plan as above   Medication Adjustments/Labs and Tests Ordered: Current medicines are reviewed at length with the patient today.  Concerns regarding medicines are outlined above.  No orders of the defined types were placed in this encounter.  Medication changes: No orders of the defined types were placed in this encounter.   Signed, Park Liter, MD, [facility] 01/27/2018 11:56 AM    [place]

## 2018-01-27 NOTE — Patient Instructions (Signed)
Medication Instructions:  Your physician recommends that you continue on your current medications as directed. Please refer to the Current Medication list given to you today.  Labwork: None ordered  Testing/Procedures: None ordered  Follow-Up: Your physician recommends that you schedule a follow-up appointment in: 6 months with Dr. Krasowski   Any Other Special Instructions Will Be Listed Below (If Applicable).     If you need a refill on your cardiac medications before your next appointment, please call your pharmacy.   

## 2018-02-13 ENCOUNTER — Encounter: Payer: Self-pay | Admitting: Family Medicine

## 2018-02-13 ENCOUNTER — Ambulatory Visit (INDEPENDENT_AMBULATORY_CARE_PROVIDER_SITE_OTHER): Payer: POS | Admitting: Family Medicine

## 2018-02-13 VITALS — BP 100/80 | HR 80 | Temp 98.6°F | Ht 66.0 in | Wt 146.5 lb

## 2018-02-13 DIAGNOSIS — Z0000 Encounter for general adult medical examination without abnormal findings: Secondary | ICD-10-CM

## 2018-02-13 DIAGNOSIS — M810 Age-related osteoporosis without current pathological fracture: Secondary | ICD-10-CM

## 2018-02-13 DIAGNOSIS — Z131 Encounter for screening for diabetes mellitus: Secondary | ICD-10-CM

## 2018-02-13 DIAGNOSIS — Z13220 Encounter for screening for lipoid disorders: Secondary | ICD-10-CM

## 2018-02-13 LAB — CBC WITH DIFFERENTIAL/PLATELET
Basophils Absolute: 0.1 10*3/uL (ref 0.0–0.1)
Basophils Relative: 1.3 % (ref 0.0–3.0)
EOS PCT: 5.6 % — AB (ref 0.0–5.0)
Eosinophils Absolute: 0.3 10*3/uL (ref 0.0–0.7)
HCT: 36.7 % (ref 36.0–46.0)
Hemoglobin: 11.8 g/dL — ABNORMAL LOW (ref 12.0–15.0)
LYMPHS ABS: 2.3 10*3/uL (ref 0.7–4.0)
Lymphocytes Relative: 49.2 % — ABNORMAL HIGH (ref 12.0–46.0)
MCHC: 32.1 g/dL (ref 30.0–36.0)
MCV: 85.4 fl (ref 78.0–100.0)
MONOS PCT: 6.6 % (ref 3.0–12.0)
Monocytes Absolute: 0.3 10*3/uL (ref 0.1–1.0)
NEUTROS ABS: 1.7 10*3/uL (ref 1.4–7.7)
NEUTROS PCT: 37.3 % — AB (ref 43.0–77.0)
PLATELETS: 337 10*3/uL (ref 150.0–400.0)
RBC: 4.29 Mil/uL (ref 3.87–5.11)
RDW: 14 % (ref 11.5–15.5)
WBC: 4.6 10*3/uL (ref 4.0–10.5)

## 2018-02-13 LAB — BASIC METABOLIC PANEL
BUN: 16 mg/dL (ref 6–23)
CO2: 29 mEq/L (ref 19–32)
Calcium: 9.6 mg/dL (ref 8.4–10.5)
Chloride: 104 mEq/L (ref 96–112)
Creatinine, Ser: 0.67 mg/dL (ref 0.40–1.20)
GFR: 116.69 mL/min (ref >60.00)
Glucose, Bld: 119 mg/dL — ABNORMAL HIGH (ref 70–99)
Potassium: 4.5 mEq/L (ref 3.5–5.1)
Sodium: 139 mEq/L (ref 135–145)

## 2018-02-13 LAB — HEMOGLOBIN A1C: HEMOGLOBIN A1C: 6.9 % — AB (ref 4.6–6.5)

## 2018-02-13 LAB — LIPID PANEL
Cholesterol: 169 mg/dL (ref 0–200)
HDL: 62.8 mg/dL (ref >39.00)
LDL Cholesterol: 89 mg/dL (ref 0–99)
NONHDL: 105.72
Total CHOL/HDL Ratio: 3
Triglycerides: 86 mg/dL (ref 0.0–149.0)
VLDL: 17.2 mg/dL (ref 0.0–40.0)

## 2018-02-13 MED ORDER — IBANDRONATE SODIUM 150 MG PO TABS: 150.0000 mg | ORAL_TABLET | ORAL | 3 refills | Status: DC

## 2018-02-13 NOTE — Patient Instructions (Signed)
Preventive Care 40-64 Years, Female Preventive care refers to lifestyle choices and visits with your health care provider that can promote health and wellness. What does preventive care include?  A yearly physical exam. This is also called an annual well check.  Dental exams once or twice a year.  Routine eye exams. Ask your health care provider how often you should have your eyes checked.  Personal lifestyle choices, including: ? Daily care of your teeth and gums. ? Regular physical activity. ? Eating a healthy diet. ? Avoiding tobacco and drug use. ? Limiting alcohol use. ? Practicing safe sex. ? Taking low-dose aspirin daily starting at age 58. ? Taking vitamin and mineral supplements as recommended by your health care provider. What happens during an annual well check? The services and screenings done by your health care provider during your annual well check will depend on your age, overall health, lifestyle risk factors, and family history of disease. Counseling Your health care provider may ask you questions about your:  Alcohol use.  Tobacco use.  Drug use.  Emotional well-being.  Home and relationship well-being.  Sexual activity.  Eating habits.  Work and work Statistician.  Method of birth control.  Menstrual cycle.  Pregnancy history.  Screening You may have the following tests or measurements:  Height, weight, and BMI.  Blood pressure.  Lipid and cholesterol levels. These may be checked every 5 years, or more frequently if you are over 81 years old.  Skin check.  Lung cancer screening. You may have this screening every year starting at age 78 if you have a 30-pack-year history of smoking and currently smoke or have quit within the past 15 years.  Fecal occult blood test (FOBT) of the stool. You may have this test every year starting at age 65.  Flexible sigmoidoscopy or colonoscopy. You may have a sigmoidoscopy every 5 years or a colonoscopy  every 10 years starting at age 30.  Hepatitis C blood test.  Hepatitis B blood test.  Sexually transmitted disease (STD) testing.  Diabetes screening. This is done by checking your blood sugar (glucose) after you have not eaten for a while (fasting). You may have this done every 1-3 years.  Mammogram. This may be done every 1-2 years. Talk to your health care provider about when you should start having regular mammograms. This may depend on whether you have a family history of breast cancer.  BRCA-related cancer screening. This may be done if you have a family history of breast, ovarian, tubal, or peritoneal cancers.  Pelvic exam and Pap test. This may be done every 3 years starting at age 80. Starting at age 36, this may be done every 5 years if you have a Pap test in combination with an HPV test.  Bone density scan. This is done to screen for osteoporosis. You may have this scan if you are at high risk for osteoporosis.  Discuss your test results, treatment options, and if necessary, the need for more tests with your health care provider. Vaccines Your health care provider may recommend certain vaccines, such as:  Influenza vaccine. This is recommended every year.  Tetanus, diphtheria, and acellular pertussis (Tdap, Td) vaccine. You may need a Td booster every 10 years.  Varicella vaccine. You may need this if you have not been vaccinated.  Zoster vaccine. You may need this after age 5.  Measles, mumps, and rubella (MMR) vaccine. You may need at least one dose of MMR if you were born in  1957 or later. You may also need a second dose.  Pneumococcal 13-valent conjugate (PCV13) vaccine. You may need this if you have certain conditions and were not previously vaccinated.  Pneumococcal polysaccharide (PPSV23) vaccine. You may need one or two doses if you smoke cigarettes or if you have certain conditions.  Meningococcal vaccine. You may need this if you have certain  conditions.  Hepatitis A vaccine. You may need this if you have certain conditions or if you travel or work in places where you may be exposed to hepatitis A.  Hepatitis B vaccine. You may need this if you have certain conditions or if you travel or work in places where you may be exposed to hepatitis B.  Haemophilus influenzae type b (Hib) vaccine. You may need this if you have certain conditions.  Talk to your health care provider about which screenings and vaccines you need and how often you need them. This information is not intended to replace advice given to you by your health care provider. Make sure you discuss any questions you have with your health care provider. Document Released: 08/19/2015 Document Revised: 04/11/2016 Document Reviewed: 05/24/2015 Elsevier Interactive Patient Education  2018 [employer].  Osteoporosis Osteoporosis is the thinning and loss of density in the bones. Osteoporosis makes the bones more brittle, fragile, and likely to break (fracture). Over time, osteoporosis can cause the bones to become so weak that they fracture after a simple fall. The bones most likely to fracture are the bones in the hip, wrist, and spine. What are the causes? The exact cause is not known. What increases the risk? Anyone can develop osteoporosis. You may be at greater risk if you have a family history of the condition or have poor nutrition. You may also have a higher risk if you are:  Female.  74 years old or older.  A smoker.  Not physically active.  White or Asian.  Slender.  What are the signs or symptoms? A fracture might be the first sign of the disease, especially if it results from a fall or injury that would not usually cause a bone to break. Other signs and symptoms include:  Low back and neck pain.  Stooped posture.  Height loss.  How is this diagnosed? To make a diagnosis, your health care provider may:  Take a medical history.  Perform a  physical exam.  Order tests, such as: ? A bone mineral density test. ? A dual-energy X-ray absorptiometry test.  How is this treated? The goal of osteoporosis treatment is to strengthen your bones to reduce your risk of a fracture. Treatment may involve:  Making lifestyle changes, such as: ? Eating a diet rich in calcium. ? Doing weight-bearing and muscle-strengthening exercises. ? Stopping tobacco use. ? Limiting alcohol intake.  Taking medicine to slow the process of bone loss or to increase bone density.  Monitoring your levels of calcium and vitamin D.  Follow these instructions at home:  Include calcium and vitamin D in your diet. Calcium is important for bone health, and vitamin D helps the body absorb calcium.  Perform weight-bearing and muscle-strengthening exercises as directed by your health care provider.  Do not use any tobacco products, including cigarettes, chewing tobacco, and electronic cigarettes. If you need help quitting, ask your health care provider.  Limit your alcohol intake.  Take medicines only as directed by your health care provider.  Keep all follow-up visits as directed by your health care provider. This is important.  Take  precautions at home to lower your risk of falling, such as: ? Keeping rooms well lit and clutter free. ? Installing safety rails on stairs. ? Using rubber mats in the bathroom and other areas that are often wet or slippery. Get help right away if: You fall or injure yourself. This information is not intended to replace advice given to you by your health care provider. Make sure you discuss any questions you have with your health care provider. Document Released: 05/02/2005 Document Revised: 12/26/2015 Document Reviewed: 12/31/2013 Elsevier Interactive Patient Education  [employer].

## 2018-02-13 NOTE — Progress Notes (Signed)
Subjective:     Breanna Martinez is a 57 y.o. female and is here for a comprehensive physical exam. The patient reports no problems.  Pt had R shoulder Surgery at the end of June.  She is doing well in PT.  Pt states she is feeling better than prior to having the procedure.  Pt request and order for Bone Density scan at [facility].  She has gone there in the past.  Pt also requesting refill on boniva.   Social History   Socioeconomic History  . Marital status: Single    Spouse name: Not on file  . Number of children: Not on file  . Years of education: Not on file  . Highest education level: Not on file  Occupational History  . Not on file  Social Needs  . Financial resource strain: Not on file  . Food insecurity:    Worry: Not on file    Inability: Not on file  . Transportation needs:    Medical: Not on file    Non-medical: Not on file  Tobacco Use  . Smoking status: Never Smoker  . Smokeless tobacco: Never Used  Substance and Sexual Activity  . Alcohol use: Yes    Comment: socially   . Drug use: No  . Sexual activity: Not on file  Lifestyle  . Physical activity:    Days per week: Not on file    Minutes per session: Not on file  . Stress: Not on file  Relationships  . Social connections:    Talks on phone: Not on file    Gets together: Not on file    Attends religious service: Not on file    Active member of club or organization: Not on file    Attends meetings of clubs or organizations: Not on file    Relationship status: Not on file  . Intimate partner violence:    Fear of current or ex partner: Not on file    Emotionally abused: Not on file    Physically abused: Not on file    Forced sexual activity: Not on file  Other Topics Concern  . Not on file  Social History Narrative  . Not on file   Health Maintenance  Topic Date Due  . Hepatitis C Screening  [birth year]  . HIV Screening  02/27/1976  . PAP SMEAR  02/26/1982  . MAMMOGRAM  02/27/2011  . COLONOSCOPY   02/27/2011  . INFLUENZA VACCINE  05/02/2018 (Originally 03/06/2018)  . TETANUS/TDAP  10/08/2024    The following portions of the patient's history were reviewed and updated as appropriate: allergies, current medications, past family history, past medical history, past social history, past surgical history and problem list.  Review of Systems A comprehensive review of systems was negative.   Objective:    BP 100/80 (BP Location: Left Arm, Patient Position: Sitting, Cuff Size: Normal)   Pulse 80   Temp 98.6 F (37 C) (Oral)   Ht 5\' 6"  (1.676 m)   Wt 146 lb 8 oz (66.5 kg)   SpO2 (!) 80%   BMI 23.65 kg/m    General appearance: alert, cooperative, appears stated age and no distress Head: Normocephalic, without obvious abnormality, atraumatic Eyes: conjunctivae/corneas clear. PERRL, EOM's intact. Fundi benign. Ears: normal TM's and external ear canals both ears Nose: Nares normal. Septum midline. Mucosa normal. No drainage or sinus tenderness. Throat: lips, mucosa, and tongue normal; teeth and gums normal Neck: no adenopathy, no carotid bruit, no JVD, supple, symmetrical,  trachea midline and thyroid not enlarged, symmetric, no tenderness/mass/nodules Lungs: clear to auscultation bilaterally Heart: regular rate and rhythm, S1, S2 normal, no murmur, click, rub or gallop Abdomen: soft, non-tender; bowel sounds normal; no masses,  no organomegaly Extremities: extremities normal, atraumatic, no cyanosis or edema Skin: Skin color, texture, turgor normal. No rashes or lesions  Healing surgical incisions on R shoulder Neurologic: Alert and oriented X 3, normal strength and tone. Normal symmetric reflexes. Normal coordination and gait   Decreased ROM of R shoulder 2/2 recent surgery.   Assessment:    Healthy female exam s/p R shoulder surgery.      Plan:     Anticipatory guidance given including wearing seatbelts, smoke detectors in the home, increasing physical activity, increasing p.o.  intake of water and vegetables. -will obtain lipid panel.  -Mammogram and Pap to be done with OB in the next few months -next CPE in 1 yr See After Visit Summary for Counseling Recommendations    Osteoporosis -Last DEXA scan 01/06/2016 -Rx written for ibandronate 150 mg every 30 days -Order for bone density scan at [facility] done -Continue calcium citrate and cholecalciferol 1000 units  Follow-up PRN  Grier Mitts, MD

## 2018-02-17 LAB — HM DEXA SCAN: HM Dexa Scan: NORMAL

## 2018-04-10 ENCOUNTER — Encounter: Payer: Self-pay | Admitting: Family Medicine

## 2018-05-27 ENCOUNTER — Encounter: Payer: Self-pay | Admitting: Family Medicine

## 2018-08-04 ENCOUNTER — Telehealth: Payer: Self-pay | Admitting: Family Medicine

## 2018-08-04 DIAGNOSIS — M810 Age-related osteoporosis without current pathological fracture: Secondary | ICD-10-CM

## 2018-08-04 NOTE — Telephone Encounter (Signed)
Copied from [place] [phone]. Topic: Quick Communication - Rx Refill/Question >> Aug 04, 2018 12:35 PM Percell Belt A wrote: Medication: ibandronate (BONIVA) 150 MG tablet [[id number]- pt needs 90 days supply per ins   Has the patient contacted their pharmacy?no  (Agent: If no, request that the patient contact the pharmacy for the refill.) (Agent: If yes, when and what did the pharmacy advise?)  Preferred Pharmacy (with phone number or street name): [facility] - this is a new pharmacy for pt .  She would [facility]   Agent: Please be advised that RX refills may take up to 3 business days. We ask that you follow-up with your pharmacy.

## 2018-08-05 NOTE — Telephone Encounter (Signed)
Requested medication (s) are due for refill today: yes  Requested medication (s) are on the active medication list: yes  Last refill:  02/13/18  Future visit scheduled: no  Notes to clinic:  Attempted to fill but note attached stated" this drug cannot be e-prescribed"   Requested Prescriptions  Pending Prescriptions Disp Refills   ibandronate (BONIVA) 150 MG tablet 4 tablet 3    Sig: Take 1 tablet (150 mg total) by mouth every 30 (thirty) days. Take in morning with full glass of water, on empty stomach, do not take anything else by mouth or lie down for the next 30 min.     Endocrinology:  Bisphosphonates Failed - 08/04/2018  5:55 PM      Failed - Vit D in normal range and within 360 days    No results found for: [id number], [id number], [id number], 25OHVITD3, 25OHVITD2, 25OHVITD3, 25OHVITD2, 25OHVITD1, 25OHVITD2, 25OHVITD3, VD25OH       Passed - Ca in normal range and within 360 days    Calcium  Date Value Ref Range Status  02/13/2018 9.6 8.4 - 10.5 mg/dL Final         Passed - Valid encounter within last 12 months    Recent Outpatient Visits          5 months ago Well adult exam   Therapist, music at [place], [place]   11 months ago Other osteoporosis, unspecified pathological fracture presence   Therapist, music at [facility], [place], [place]

## 2018-08-05 NOTE — Telephone Encounter (Signed)
Pt LOV was on 02/13/2018 and last refill was done on 02/13/2018 for 4 tablets with 3 refills, please advise if ok to send refills

## 2018-08-10 MED ORDER — IBANDRONATE SODIUM 150 MG PO TABS: 150.0000 mg | ORAL_TABLET | ORAL | 3 refills | Status: DC

## 2018-08-11 ENCOUNTER — Other Ambulatory Visit: Payer: Self-pay

## 2018-08-11 DIAGNOSIS — M810 Age-related osteoporosis without current pathological fracture: Secondary | ICD-10-CM

## 2018-08-11 MED ORDER — IBANDRONATE SODIUM 150 MG PO TABS: 150.0000 mg | ORAL_TABLET | ORAL | 3 refills | Status: DC

## 2018-09-01 NOTE — Telephone Encounter (Signed)
Patient wanted this to go to [place]. Please cancel the script at [facility] and send in a short supply to hold her over until this is corrected and she receives Ibandronate from [facility] in the mail, per patient request.

## 2018-09-03 ENCOUNTER — Other Ambulatory Visit: Payer: Self-pay

## 2018-09-03 DIAGNOSIS — M810 Age-related osteoporosis without current pathological fracture: Secondary | ICD-10-CM

## 2018-09-03 MED ORDER — IBANDRONATE SODIUM 150 MG PO TABS: 150.0000 mg | ORAL_TABLET | ORAL | 2 refills | Status: DC

## 2018-09-03 NOTE — Telephone Encounter (Signed)
[facility] was sent to [facility] as requested by pt

## 2018-09-10 ENCOUNTER — Telehealth: Payer: Self-pay

## 2018-09-10 NOTE — Telephone Encounter (Signed)
Copied from [place] [phone]. Topic: General - Other >> Sep 10, 2018 11:52 AM Carolyn Stare wrote:  [facility] Vickie call to ask if she can change the direction to say do not lay down for  60 minutes     [phone] ref number [id number]

## 2018-09-10 NOTE — Telephone Encounter (Signed)
[facility] but the office was closed will call back in the morning

## 2018-09-17 ENCOUNTER — Other Ambulatory Visit: Payer: Self-pay

## 2018-09-17 DIAGNOSIS — M810 Age-related osteoporosis without current pathological fracture: Secondary | ICD-10-CM

## 2018-09-17 MED ORDER — IBANDRONATE SODIUM 150 MG PO TABS: 150.0000 mg | ORAL_TABLET | ORAL | 2 refills | Status: DC

## 2018-09-17 NOTE — Telephone Encounter (Signed)
Spoke with Vickie aware that the directions for pt Rx for Boniva was changed and refax to [facility]

## 2019-02-13 ENCOUNTER — Telehealth: Payer: Self-pay | Admitting: *Deleted

## 2019-02-13 NOTE — Telephone Encounter (Signed)
Copied from [place] [phone]. Topic: Appointment Scheduling - Scheduling Inquiry for Clinic >> Feb 13, 2019 12:11 PM Rutherford Nail, [place] wrote: Reason for CRM: Patient calling to schedule CPE. Last 02/13/2018. Attempted office x2, no answer. Please advise.

## 2019-02-17 NOTE — Telephone Encounter (Signed)
Called pt Breanna Martinez for pt to call the office and schedule her for CPE

## 2019-04-15 ENCOUNTER — Ambulatory Visit (INDEPENDENT_AMBULATORY_CARE_PROVIDER_SITE_OTHER): Payer: POS | Admitting: Family Medicine

## 2019-04-15 ENCOUNTER — Encounter: Payer: Self-pay | Admitting: Family Medicine

## 2019-04-15 ENCOUNTER — Other Ambulatory Visit: Payer: Self-pay | Admitting: Family Medicine

## 2019-04-15 ENCOUNTER — Other Ambulatory Visit: Payer: Self-pay

## 2019-04-15 VITALS — BP 98/78 | HR 70 | Temp 98.1°F | Wt 140.0 lb

## 2019-04-15 DIAGNOSIS — M818 Other osteoporosis without current pathological fracture: Secondary | ICD-10-CM

## 2019-04-15 DIAGNOSIS — E119 Type 2 diabetes mellitus without complications: Secondary | ICD-10-CM

## 2019-04-15 DIAGNOSIS — Z0000 Encounter for general adult medical examination without abnormal findings: Secondary | ICD-10-CM

## 2019-04-15 DIAGNOSIS — Z13220 Encounter for screening for lipoid disorders: Secondary | ICD-10-CM | POA: Diagnosis not present

## 2019-04-15 DIAGNOSIS — E559 Vitamin D deficiency, unspecified: Secondary | ICD-10-CM

## 2019-04-15 LAB — CBC WITH DIFFERENTIAL/PLATELET
Basophils Absolute: 0 10*3/uL (ref 0.0–0.1)
Basophils Relative: 0.8 % (ref 0.0–3.0)
Eosinophils Absolute: 0.1 10*3/uL (ref 0.0–0.7)
Eosinophils Relative: 2 % (ref 0.0–5.0)
HCT: 37.5 % (ref 36.0–46.0)
Hemoglobin: 12.1 g/dL (ref 12.0–15.0)
Lymphocytes Relative: 50.8 % — ABNORMAL HIGH (ref 12.0–46.0)
Lymphs Abs: 2.4 10*3/uL (ref 0.7–4.0)
MCHC: 32.3 g/dL (ref 30.0–36.0)
MCV: 85.3 fl (ref 78.0–100.0)
Monocytes Absolute: 0.3 10*3/uL (ref 0.1–1.0)
Monocytes Relative: 6.7 % (ref 3.0–12.0)
Neutro Abs: 1.9 10*3/uL (ref 1.4–7.7)
Neutrophils Relative %: 39.7 % — ABNORMAL LOW (ref 43.0–77.0)
Platelets: 277 10*3/uL (ref 150.0–400.0)
RBC: 4.4 Mil/uL (ref 3.87–5.11)
RDW: 13.3 % (ref 11.5–15.5)
WBC: 4.8 10*3/uL (ref 4.0–10.5)

## 2019-04-15 LAB — BASIC METABOLIC PANEL
BUN: 15 mg/dL (ref 6–23)
CO2: 24 mEq/L (ref 19–32)
Calcium: 9.4 mg/dL (ref 8.4–10.5)
Chloride: 105 mEq/L (ref 96–112)
Creatinine, Ser: 0.54 mg/dL (ref 0.40–1.20)
GFR: 140.24 mL/min (ref >60.00)
Glucose, Bld: 119 mg/dL — ABNORMAL HIGH (ref 70–99)
Potassium: 4.2 mEq/L (ref 3.5–5.1)
Sodium: 137 mEq/L (ref 135–145)

## 2019-04-15 LAB — LIPID PANEL
Cholesterol: 157 mg/dL (ref 0–200)
HDL: 58.1 mg/dL (ref >39.00)
LDL Cholesterol: 77 mg/dL (ref 0–99)
NonHDL: 99.32
Total CHOL/HDL Ratio: 3
Triglycerides: 112 mg/dL (ref 0.0–149.0)
VLDL: 22.4 mg/dL (ref 0.0–40.0)

## 2019-04-15 LAB — MICROALBUMIN / CREATININE URINE RATIO
Creatinine,U: 127.5 mg/dL
Microalb Creat Ratio: 0.8 mg/g (ref 0.0–30.0)
Microalb, Ur: 1.1 mg/dL (ref 0.0–1.9)

## 2019-04-15 LAB — HEMOGLOBIN A1C: Hgb A1c MFr Bld: 6.9 % — ABNORMAL HIGH (ref 4.6–6.5)

## 2019-04-15 LAB — VITAMIN D 25 HYDROXY (VIT D DEFICIENCY, FRACTURES): VITD: 27.54 ng/mL — ABNORMAL LOW (ref 30.00–100.00)

## 2019-04-15 MED ORDER — VITAMIN D (ERGOCALCIFEROL) 1.25 MG (50000 UNIT) PO CAPS: 50000.0000 [IU] | ORAL_CAPSULE | ORAL | 0 refills | Status: DC

## 2019-04-15 NOTE — Progress Notes (Signed)
Subjective:     Breanna Martinez is a 58 y.o. female and is here for a comprehensive physical exam. The patient reports no problems.  Pt has lost 5 lbs and is eating better since last OFV.  Pt had surgery on her R shoulder last yr and is recovering nicely.  Pt notes improvement in ROM.  On boniva for osteoporosis.  Last Dexa scane 02/17/18.  Mammogram and Pap scheduled for later this week.  Pt still working at the [employer].  Pt states her mother is now living in [place] with her sister.  Social History   Socioeconomic History  . Marital status: Single    Spouse name: Not on file  . Number of children: Not on file  . Years of education: Not on file  . Highest education level: Not on file  Occupational History  . Not on file  Social Needs  . Financial resource strain: Not on file  . Food insecurity    Worry: Not on file    Inability: Not on file  . Transportation needs    Medical: Not on file    Non-medical: Not on file  Tobacco Use  . Smoking status: Never Smoker  . Smokeless tobacco: Never Used  Substance and Sexual Activity  . Alcohol use: Yes    Comment: socially   . Drug use: No  . Sexual activity: Not on file  Lifestyle  . Physical activity    Days per week: Not on file    Minutes per session: Not on file  . Stress: Not on file  Relationships  . Social Herbalist on phone: Not on file    Gets together: Not on file    Attends religious service: Not on file    Active member of club or organization: Not on file    Attends meetings of clubs or organizations: Not on file    Relationship status: Not on file  . Intimate partner violence    Fear of current or ex partner: Not on file    Emotionally abused: Not on file    Physically abused: Not on file    Forced sexual activity: Not on file  Other Topics Concern  . Not on file  Social History Narrative  . Not on file   Health Maintenance  Topic Date Due  . Hepatitis C Screening  Oct 20, 1960  . HIV  Screening  02/27/1976  . PAP SMEAR-Modifier  02/26/1982  . MAMMOGRAM  02/27/2011  . COLONOSCOPY  02/27/2011  . INFLUENZA VACCINE  03/07/2019  . TETANUS/TDAP  10/08/2024    The following portions of the patient's history were reviewed and updated as appropriate: allergies, current medications, past family history, past medical history, past social history, past surgical history and problem list.  Review of Systems Pertinent items noted in HPI and remainder of comprehensive ROS otherwise negative.   Objective:    BP 98/78 (BP Location: Left Arm, Patient Position: Sitting, Cuff Size: Normal)   Pulse 70   Temp 98.1 F (36.7 C) (Oral)   Wt 140 lb (63.5 kg)   SpO2 98%   BMI 22.60 kg/m  General appearance: alert, cooperative and no distress Head: Normocephalic, without obvious abnormality, atraumatic Eyes: conjunctivae/corneas clear. PERRL, EOM's intact. Fundi benign. Ears: normal TM's and external ear canals both ears Nose: Nares normal. Septum midline. Mucosa normal. No drainage or sinus tenderness. Throat: lips, mucosa, and tongue normal; teeth and gums normal Neck: no adenopathy, no carotid bruit, no  JVD, supple, symmetrical, trachea midline and thyroid not enlarged, symmetric, no tenderness/mass/nodules Lungs: clear to auscultation bilaterally Heart: regular rate and rhythm, S1, S2 normal, no murmur, click, rub or gallop Abdomen: soft, non-tender; bowel sounds normal; no masses,  no organomegaly Extremities: extremities normal, atraumatic, no cyanosis or edema Pulses: 2+ and symmetric Skin: Skin color, texture, turgor normal. No rashes or lesions Lymph nodes: Cervical, supraclavicular, and axillary nodes normal. Neurologic: Alert and oriented X 3, normal strength and tone. Normal symmetric reflexes. Normal coordination and gait    Diabetic Foot Exam - Simple   Simple Foot Form Visual Inspection No deformities, no ulcerations, no other skin breakdown bilaterally: Yes Sensation  Testing Intact to touch and monofilament testing bilaterally: Yes Pulse Check Posterior Tibialis and Dorsalis pulse intact bilaterally: Yes Comments Mildly decreased sensation to vibration and fine touch in digits of b/l feet       Assessment:    Healthy female exam.      Plan:     Anticipatory guidance given including wearing seatbelts, smoke detectors in the home, increasing physical activity, increasing p.o. intake of water and vegetables. -We will obtain labs -Mammogram and Pap scheduled -Offered influenza vaccine.  Patient declines. -Given handout -Next CPE in 1 year See After Visit Summary for Counseling Recommendations    Other osteoporosis, unspecified pathological fracture presence  -continue boniva, weight bearing exercise, and increasing dietary intake of calcium and vit D. - Plan: Vitamin D, 25-hydroxy  Type 2 diabetes mellitus without complication, without long-term current use of insulin (HCC)  -diet controlled -continue lifestyle modifications -last hgb A1C was 6.9% on 02/13/18 - Plan: Hemoglobin A1c, urine microalbumin/creatinine   Screening for cholesterol level  - Plan: Lipid panel  F/u prn  Grier Mitts, MD

## 2019-04-15 NOTE — Patient Instructions (Signed)
Preventive Care 40-58 Years Old, Female Preventive care refers to visits with your health care provider and lifestyle choices that can promote health and wellness. This includes:  A yearly physical exam. This may also be called an annual well check.  Regular dental visits and eye exams.  Immunizations.  Screening for certain conditions.  Healthy lifestyle choices, such as eating a healthy diet, getting regular exercise, not using drugs or products that contain nicotine and tobacco, and limiting alcohol use. What can I expect for my preventive care visit? Physical exam Your health care provider will check your:  Height and weight. This may be used to calculate body mass index (BMI), which tells if you are at a healthy weight.  Heart rate and blood pressure.  Skin for abnormal spots. Counseling Your health care provider may ask you questions about your:  Alcohol, tobacco, and drug use.  Emotional well-being.  Home and relationship well-being.  Sexual activity.  Eating habits.  Work and work environment.  Method of birth control.  Menstrual cycle.  Pregnancy history. What immunizations do I need?  Influenza (flu) vaccine  This is recommended every year. Tetanus, diphtheria, and pertussis (Tdap) vaccine  You may need a Td booster every 10 years. Varicella (chickenpox) vaccine  You may need this if you have not been vaccinated. Zoster (shingles) vaccine  You may need this after age 60. Measles, mumps, and rubella (MMR) vaccine  You may need at least one dose of MMR if you were born in 1957 or later. You may also need a second dose. Pneumococcal conjugate (PCV13) vaccine  You may need this if you have certain conditions and were not previously vaccinated. Pneumococcal polysaccharide (PPSV23) vaccine  You may need one or two doses if you smoke cigarettes or if you have certain conditions. Meningococcal conjugate (MenACWY) vaccine  You may need this if you  have certain conditions. Hepatitis A vaccine  You may need this if you have certain conditions or if you travel or work in places where you may be exposed to hepatitis A. Hepatitis B vaccine  You may need this if you have certain conditions or if you travel or work in places where you may be exposed to hepatitis B. Haemophilus influenzae type b (Hib) vaccine  You may need this if you have certain conditions. Human papillomavirus (HPV) vaccine  If recommended by your health care provider, you may need three doses over 6 months. You may receive vaccines as individual doses or as more than one vaccine together in one shot (combination vaccines). Talk with your health care provider about the risks and benefits of combination vaccines. What tests do I need? Blood tests  Lipid and cholesterol levels. These may be checked every 5 years, or more frequently if you are over 50 years old.  Hepatitis C test.  Hepatitis B test. Screening  Lung cancer screening. You may have this screening every year starting at age 55 if you have a 30-pack-year history of smoking and currently smoke or have quit within the past 15 years.  Colorectal cancer screening. All adults should have this screening starting at age 50 and continuing until age 75. Your health care provider may recommend screening at age 45 if you are at increased risk. You will have tests every 1-10 years, depending on your results and the type of screening test.  Diabetes screening. This is done by checking your blood sugar (glucose) after you have not eaten for a while (fasting). You may have this   done every 1-3 years.  Mammogram. This may be done every 1-2 years. Talk with your health care provider about when you should start having regular mammograms. This may depend on whether you have a family history of breast cancer.  BRCA-related cancer screening. This may be done if you have a family history of breast, ovarian, tubal, or peritoneal  cancers.  Pelvic exam and Pap test. This may be done every 3 years starting at age 54. Starting at age 33, this may be done every 5 years if you have a Pap test in combination with an HPV test. Other tests  Sexually transmitted disease (STD) testing.  Bone density scan. This is done to screen for osteoporosis. You may have this scan if you are at high risk for osteoporosis. Follow these instructions at home: Eating and drinking  Eat a diet that includes fresh fruits and vegetables, whole grains, lean protein, and low-fat dairy.  Take vitamin and mineral supplements as recommended by your health care provider.  Do not drink alcohol if: ? Your health care provider tells you not to drink. ? You are pregnant, may be pregnant, or are planning to become pregnant.  If you drink alcohol: ? Limit how much you have to 0-1 drink a day. ? Be aware of how much alcohol is in your drink. In the [place] one drink equals one 12 oz bottle of beer (355 mL), one 5 oz glass of wine (148 mL), or one 1 oz glass of hard liquor (44 mL). Lifestyle  Take daily care of your teeth and gums.  Stay active. Exercise for at least 30 minutes on 5 or more days each week.  Do not use any products that contain nicotine or tobacco, such as cigarettes, e-cigarettes, and chewing tobacco. If you need help quitting, ask your health care provider.  If you are sexually active, practice safe sex. Use a condom or other form of birth control (contraception) in order to prevent pregnancy and STIs (sexually transmitted infections).  If told by your health care provider, take low-dose aspirin daily starting at age 43. What's next?  Visit your health care provider once a year for a well check visit.  Ask your health care provider how often you should have your eyes and teeth checked.  Stay up to date on all vaccines. This information is not intended to replace advice given to you by your health care provider. Make sure you  discuss any questions you have with your health care provider. Document Released: 08/19/2015 Document Revised: 04/03/2018 Document Reviewed: 04/03/2018 Elsevier Patient Education  2020 [employer].  Osteoporosis  Osteoporosis is thinning and loss of density in your bones. Osteoporosis makes bones more brittle and fragile and more likely to break (fracture). Over time, osteoporosis can cause your bones to become so weak that they fracture after a minor fall. Bones in the hip, wrist, and spine are most likely to fracture due to osteoporosis. What are the causes? The exact cause of this condition is not known. What increases the risk? You may be at greater risk for osteoporosis if you:  Have a family history of the condition.  Have poor nutrition.  Use steroid medicines, such as prednisone.  Are female.  Are age 10 or older.  Smoke or have a history of smoking.  Are not physically active (are sedentary).  Are white (Caucasian) or of Asian descent.  Have a small body frame.  Take certain medicines, such as antiseizure medicines. What are the signs  or symptoms? A fracture might be the first sign of osteoporosis, especially if the fracture results from a fall or injury that usually would not cause a bone to break. Other signs and symptoms include:  Pain in the neck or low back.  Stooped posture.  Loss of height. How is this diagnosed? This condition may be diagnosed based on:  Your medical history.  A physical exam.  A bone mineral density test, also called a DXA or DEXA test (dual-energy X-ray absorptiometry test). This test uses X-rays to measure the amount of minerals in your bones. How is this treated? The goal of treatment is to strengthen your bones and lower your risk for a fracture. Treatment may involve:  Making lifestyle changes, such as: ? Including foods with more calcium and vitamin D in your diet. ? Doing weight-bearing and muscle-strengthening exercises.  ? Stopping tobacco use. ? Limiting alcohol intake.  Taking medicine to slow the process of bone loss or to increase bone density.  Taking daily supplements of calcium and vitamin D.  Taking hormone replacement medicines, such as estrogen for women and testosterone for men.  Monitoring your levels of calcium and vitamin D. Follow these instructions at home:  Activity  Exercise as told by your health care provider. Ask your health care provider what exercises and activities are safe for you. You should do: ? Exercises that make you work against gravity (weight-bearing exercises), such as tai chi, yoga, or walking. ? Exercises to strengthen muscles, such as lifting weights. Lifestyle  Limit alcohol intake to no more than 1 drink a day for nonpregnant women and 2 drinks a day for men. One drink equals 12 oz of beer, 5 oz of wine, or 1 oz of hard liquor.  Do not use any products that contain nicotine or tobacco, such as cigarettes and e-cigarettes. If you need help quitting, ask your health care provider. Preventing falls  Use devices to help you move around (mobility aids) as needed, such as canes, walkers, scooters, or crutches.  Keep rooms well-lit and clutter-free.  Remove tripping hazards from walkways, including cords and throw rugs.  Install grab bars in bathrooms and safety rails on stairs.  Use rubber mats in the bathroom and other areas that are often wet or slippery.  Wear closed-toe shoes that fit well and support your feet. Wear shoes that have rubber soles or low heels.  Review your medicines with your health care provider. Some medicines can cause dizziness or changes in blood pressure, which can increase your risk of falling. General instructions  Include calcium and vitamin D in your diet. Calcium is important for bone health, and vitamin D helps your body to absorb calcium. Good sources of calcium and vitamin D include: ? Certain fatty fish, such as salmon and  tuna. ? Products that have calcium and vitamin D added to them (fortified products), such as fortified cereals. ? Egg yolks. ? Cheese. ? Liver.  Take over-the-counter and prescription medicines only as told by your health care provider.  Keep all follow-up visits as told by your health care provider. This is important. Contact a health care provider if:  You have never been screened for osteoporosis and you are: ? A woman who is age 40 or older. ? A man who is age 36 or older. Get help right away if:  You fall or injure yourself. Summary  Osteoporosis is thinning and loss of density in your bones. This makes bones more brittle and fragile and  more likely to break (fracture),even with minor falls.  The goal of treatment is to strengthen your bones and reduce your risk for a fracture.  Include calcium and vitamin D in your diet. Calcium is important for bone health, and vitamin D helps your body to absorb calcium.  Talk with your health care provider about screening for osteoporosis if you are a woman who is age 42 or older, or a man who is age 72 or older. This information is not intended to replace advice given to you by your health care provider. Make sure you discuss any questions you have with your health care provider. Document Released: 05/02/2005 Document Revised: 07/05/2017 Document Reviewed: 05/17/2017 Elsevier Patient Education  2020 [employer] for Osteoporosis Osteoporosis causes your bones to become weak and brittle. This puts you at greater risk for bone breaks (fractures) from small bumps or falls. Making changes to your diet and increasing your physical activity can help strengthen your bones and improve your overall health. Calcium and vitamin D are nutrients that play an important role in bone health. Vitamin D helps your body use calcium and strengthen bones. Therefore, it is important to get enough calcium and vitamin D as part of your eating plan  for osteoporosis. What are tips for following this plan? Reading food labels  Try to get at least 1,000 milligrams (mg) of calcium each day.  Look for foods that have at least 50 mg of calcium per serving.  Talk with your health care provider about taking a calcium supplement if you do not get enough calcium from food.  Do not have more than 2,500 mg of calcium each day. This is the upper limit for food and nutritional supplements combined. Too much calcium may cause constipation and prevent you from absorbing other important nutrients.  Choose foods that contain vitamin D.  Take a daily vitamin supplement that contains 800-1,000 international units (IU) of vitamin D. The amount may be different depending on your age, body weight, ethnicity, and where you live. Talk with your dietitian or health care provider about how much vitamin D is right for you.  Avoid foods that have more than 300 mg of sodium per serving. Too much sodium can cause your body to lose calcium.  Talk with your dietitian or health care provider about how much sodium you are allowed each day. Shopping  Do not buy foods with added salt, including: ? Salted snacks. ? Angie Fava. ? Canned soups. ? Canned meats. ? Processed meats, such as bacon or cold cuts. ? Smoked fish. Meal planning  Eat balanced meals that contain protein foods, fruits and vegetables, and foods rich in calcium and vitamin D.  Eat at least 5 servings of fruits and vegetables each day.  Eat 5-6 oz. of lean meat, poultry, fish, eggs, or beans each day. Lifestyle  Do not use any products that contain nicotine or tobacco, such as cigarettes and e-cigarettes. If you need help quitting, ask your health care provider.  If your health care provider recommends that you lose weight: ? Work with a dietitian to develop an eating plan that will help you reach your desired weight goal. ? Exercise for at least 30 minutes a day, 5 or more days a week, or as  told by your health care provider.  Work with a physical therapist to develop an exercise plan that includes flexibility, balance, and strength exercises.  If you drink alcohol, limit how much you have. This means: ?  0-1 drink a day for women. ? 0-2 drinks a day for men. ? Be aware of how much alcohol is in your drink. In the [place] one drink equals one typical bottle of beer (12 oz), one-half glass of wine (5 oz), or one shot of hard liquor (1 oz). What foods should I eat? Foods high in calcium   Yogurt. Yogurt with fruit.  Milk. Evaporated skim milk. Dry milk powder.  Calcium-fortified orange juice.  Parmesan cheese. Part-skim ricotta cheese. Natural hard cheese. Cream cheese. Cottage cheese.  Canned sardines. Canned salmon.  Calcium-treated tofu. Calcium-fortified cereal bar. Calcium-fortified cereal. Calcium-fortified graham crackers.  Cooked collard greens. Turnip greens. Broccoli. Kale.  Almonds.  White beans.  Corn tortilla. Foods high in vitamin D  Cod liver oil. Fatty fish, such as tuna, mackerel, and salmon.  Milk. Fortified soy milk. Fortified fruit juice.  Yogurt. Margarine.  Egg yolks. Foods high in protein  Beef. Lamb. Pork tenderloin.  Chicken breast.  Tuna (canned). Fish fillet.  Tofu.  Soy beans (cooked). Soy patty. Beans (canned or cooked).  Cottage cheese.  Yogurt.  Peanut butter.  Pumpkin seeds. Nuts. Sunflower seeds.  Hard cheese.  Milk or other milk products, such as soy milk. The items listed above may not be a complete list of foods and beverages you can eat. Contact a dietitian for more options. Summary  Calcium and vitamin D are nutrients that play an important role in bone health and are an important part of your eating plan for osteoporosis.  Eat balanced meals that contain protein foods, fruits and vegetables, and foods rich in calcium and vitamin D.  Avoid foods that have more than 300 mg of sodium per serving. Too  much sodium can cause your body to lose calcium.  Exercise is an important part of prevention and treatment of osteoporosis. Aim for at least 30 minutes a day, 5 days a week. This information is not intended to replace advice given to you by your health care provider. Make sure you discuss any questions you have with your health care provider. Document Released: 09/30/2017 Document Revised: 09/30/2017 Document Reviewed: 09/30/2017 Elsevier Patient Education  2020 [employer].

## 2019-04-17 LAB — HM MAMMOGRAPHY

## 2019-04-27 ENCOUNTER — Encounter: Payer: Self-pay | Admitting: Family Medicine

## 2019-07-05 ENCOUNTER — Telehealth: Payer: POS | Admitting: Family

## 2019-07-05 DIAGNOSIS — R300 Dysuria: Secondary | ICD-10-CM

## 2019-07-05 MED ORDER — NITROFURANTOIN MONOHYD MACRO 100 MG PO CAPS: 100.0000 mg | ORAL_CAPSULE | Freq: Two times a day (BID) | ORAL | 0 refills | Status: DC

## 2019-07-05 NOTE — Progress Notes (Signed)

## 2019-08-27 ENCOUNTER — Encounter: Payer: Self-pay | Admitting: Family Medicine

## 2019-08-28 ENCOUNTER — Telehealth (INDEPENDENT_AMBULATORY_CARE_PROVIDER_SITE_OTHER): Payer: POS | Admitting: Family Medicine

## 2019-08-28 DIAGNOSIS — M545 Low back pain, unspecified: Secondary | ICD-10-CM

## 2019-08-28 MED ORDER — CYCLOBENZAPRINE HCL 5 MG PO TABS: 5.0000 mg | ORAL_TABLET | Freq: Three times a day (TID) | ORAL | 0 refills | Status: DC | PRN

## 2019-08-28 NOTE — Progress Notes (Signed)
Virtual Visit via Telephone Note Attempted to connect with pt via doxy, however there was a delay and pt unable to hear provider.  Pt called to complete visit.  I connected with Breanna Martinez on 08/28/19 at  3:00 PM EST by telephone and verified that I am speaking with the correct person using two identifiers.   I discussed the limitations, risks, security and privacy concerns of performing an evaluation and management service by telephone and the availability of in person appointments. I also discussed with the patient that there may be a patient responsible charge related to this service. The patient expressed understanding and agreed to proceed.  Location patient: passenger in the car Location provider: work or home office Participants present for the call: patient, provider.  Pt's friend in car. Patient did not have a visit in the prior 7 days to address this/these issue(s).   History of Present Illness: Pt is a 59 yo female with pmh sig for atrial tachycardia, osteoporosis, preDM seen for acute concern.  Pt with pain above L hip below L rib cage.  Pain worse at night.  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 Pt notes the pain moved to the right side last night.  Tried biofreeze.  Pt has to adjust her walk to prevent pain, but was able to walk 3 miles today.  Pt denies dysuria, n/v, fever, chills, suprapubic pain.  Pt works at the [place] [employer].   Observations/Objective: Patient sounds cheerful and well on the phone. I do not appreciate any SOB. Speech and thought processing are grossly intact. Patient reported vitals:  Assessment and Plan: Lumbar back pain  -likely 2/2 muscle strain.  Also consider renal calculi though current level of pain less than what would be expected. -discussed supportive care: Tylenol or NASIDs, heat, massage, OTC analgesic rubs.   -Also discussed increasing po intake of water and fluids.   -discussed likely duration  of symptoms -given precautions.  For worsening symptoms obtain UA and possible stone study. - Plan: cyclobenzaprine (FLEXERIL) 5 MG tablet  Follow Up Instructions: F/u prn  I did not refer this patient for an OV in the next 24 hours for this/these issue(s).  I discussed the assessment and treatment plan with the patient. The patient was provided an opportunity to ask questions and all were answered. The patient agreed with the plan and demonstrated an understanding of the instructions.   The patient was advised to call back or seek an in-person evaluation if the symptoms worsen or if the condition fails to improve as anticipated.  I provided 11 minutes of non-face-to-face time during this encounter.   Billie Ruddy, MD

## 2019-08-28 NOTE — Telephone Encounter (Signed)
Pt has been scheduled for this afternoon with Dr Volanda Napoleon

## 2019-09-25 ENCOUNTER — Telehealth: Payer: Self-pay | Admitting: Family Medicine

## 2019-09-25 NOTE — Telephone Encounter (Signed)
Pt aware to continue taking OTC vit D as advised by Dr Volanda Napoleon

## 2019-09-25 NOTE — Telephone Encounter (Signed)
Medication Refill: Vitamin D  Pharmacy: [facility]  Phone:

## 2019-10-15 ENCOUNTER — Telehealth: Payer: Self-pay | Admitting: *Deleted

## 2019-10-15 NOTE — Telephone Encounter (Signed)
Please advise 

## 2019-10-15 NOTE — Telephone Encounter (Signed)
Patient called office to inquire about taking Covid vaccine on Monday. Patient stated she is allergic to Penicillin and would like to know if it was okay to get vaccine. Please advise.

## 2019-10-16 NOTE — Telephone Encounter (Signed)
Ok to get vaccine

## 2019-10-16 NOTE — Telephone Encounter (Signed)
Spoke with pt advised that its ok to get the COVID-19 vaccine per Dr Volanda Napoleon

## 2019-12-22 ENCOUNTER — Encounter: Payer: Self-pay | Admitting: Family Medicine

## 2019-12-22 ENCOUNTER — Telehealth (INDEPENDENT_AMBULATORY_CARE_PROVIDER_SITE_OTHER): Payer: POS | Admitting: Family Medicine

## 2019-12-22 DIAGNOSIS — J302 Other seasonal allergic rhinitis: Secondary | ICD-10-CM

## 2019-12-22 DIAGNOSIS — Z7189 Other specified counseling: Secondary | ICD-10-CM | POA: Diagnosis not present

## 2019-12-22 DIAGNOSIS — M810 Age-related osteoporosis without current pathological fracture: Secondary | ICD-10-CM | POA: Diagnosis not present

## 2019-12-22 DIAGNOSIS — R05 Cough: Secondary | ICD-10-CM | POA: Diagnosis not present

## 2019-12-22 DIAGNOSIS — R059 Cough, unspecified: Secondary | ICD-10-CM

## 2019-12-22 MED ORDER — CETIRIZINE HCL 10 MG PO TABS: 10.0000 mg | ORAL_TABLET | Freq: Every day | ORAL | 3 refills | Status: DC

## 2019-12-22 MED ORDER — FLUTICASONE PROPIONATE 50 MCG/ACT NA SUSP: 1.0000 | Freq: Every day | NASAL | 2 refills | Status: DC

## 2019-12-22 MED ORDER — BENZONATATE 100 MG PO CAPS: 100.0000 mg | ORAL_CAPSULE | Freq: Two times a day (BID) | ORAL | 0 refills | Status: DC | PRN

## 2019-12-22 MED ORDER — IBANDRONATE SODIUM 150 MG PO TABS: 150.0000 mg | ORAL_TABLET | ORAL | 2 refills | Status: DC

## 2019-12-22 NOTE — Progress Notes (Signed)
Virtual Visit via Video Note  I connected with Breanna Martinez on 12/22/19 at  1:00 PM EDT by a video enabled telemedicine application 2/2 [id number] pandemic and verified that I am speaking with the correct person using two identifiers.  Location patient: home Location provider:work or home office Persons participating in the virtual visit: patient, provider  I discussed the limitations of evaluation and management by telemedicine and the availability of in person appointments. The patient expressed understanding and agreed to proceed.   HPI: Pt is a 59 yo female with pmh sig for PACs, atrial tachycardia, osteoporosis, pre DM who was seen for acute concern.  Pt endorses sneezing, coughing, rhinorrhea, irritated throat, mild pressure in L side of face since Sunday.  At times coughing spells cause SOB and HAs. Denies ear pain, ear pressure, n/v, diarrhea.  Pt tried generic allegra, drinking more water and fluids.  Pt notes she was outside on the deck recently which may have contributed to her symptoms.  Pt moving on Thursday.  Off for the last few days for the move.    Pt lost 4 family members to [place].  Pt seeing her gynecologist in [place].  ROS: See pertinent positives and negatives per HPI.  Past Medical History:  Diagnosis Date  . Osteoporosis     Past Surgical History:  Procedure Laterality Date  . TOE SURGERY      Family History  Problem Relation Age of Onset  . Diabetes Mother   . Glaucoma Mother   . Prostate cancer Father      Current Outpatient Medications:  .  acetaminophen (TYLENOL) 500 MG tablet, Take 500 mg by mouth every 6 (six) hours as needed for moderate pain., Disp: , Rfl:  .  Calcium Citrate (CITRACAL PO), Take by mouth., Disp: , Rfl:  .  Chlorpheniramine-Phenylephrine (SUDAFED PE SINUS/ALLERGY PO), Take 1 tablet by mouth as needed., Disp: , Rfl:  .  Cholecalciferol (D3-1000) 1000 units capsule, Take 1,000 Units by mouth daily., Disp: , Rfl:  .   cyclobenzaprine (FLEXERIL) 5 MG tablet, Take 1 tablet (5 mg total) by mouth 3 (three) times daily as needed for muscle spasms., Disp: 30 tablet, Rfl: 0 .  ibandronate (BONIVA) 150 MG tablet, Take 1 tablet (150 mg total) by mouth every 30 (thirty) days. Take in morning with full glass of water, on empty stomach, do not take anything else by mouth or lie down for the next 60 min., Disp: 4 tablet, Rfl: 2 .  Multiple Vitamins-Minerals (MULTIVITAMIN ADULT EXTRA C PO), multivitamin  Take 1 tablet as directed by oral route, Disp: , Rfl:  .  SALINE NASAL MIST NA, Place into the nose as needed., Disp: , Rfl:  .  Vitamin D, Ergocalciferol, (DRISDOL) 1.25 MG (50000 UT) CAPS capsule, Take 1 capsule (50,000 Units total) by mouth every 7 (seven) days., Disp: 12 capsule, Rfl: 0  EXAM:  VITALS per patient if applicable: RR between 123456 bpm  GENERAL: alert, oriented, appears well and in no acute distress  HEENT: atraumatic, conjunctiva clear, no obvious abnormalities on inspection of external nose and ears  NECK: normal movements of the head and neck  LUNGS: intermittent dry cough.  on inspection no signs of respiratory distress, breathing rate appears normal, no obvious gross SOB, gasping or wheezing  CV: no obvious cyanosis  MS: moves all visible extremities without noticeable abnormality  PSYCH/NEURO: pleasant and cooperative, no obvious depression or anxiety, speech and thought processing grossly intact  ASSESSMENT AND PLAN:  Discussed the  following assessment and plan:  Seasonal allergies -continue supportive care  - Plan: fluticasone (FLONASE) 50 MCG/ACT nasal spray, cetirizine (ZYRTEC) 10 MG tablet  Educated about COVID-19 virus infection -discussed s/s -consider COVID testing given allergy symptoms overlapping with COVID, consider COVID testing especially if no improvement in symptoms with new allergy meds -self quarantine.  Osteoporosis, unspecified osteoporosis type, unspecified  pathological fracture presence  -continue walking and other weight bearing exercise. - Plan: ibandronate (BONIVA) 150 MG tablet  Cough  -continue supportive care -allergy control will likely improve cough.   -will start floanse and zyrtec - Plan: benzonatate (TESSALON) 100 MG capsule  F/u prn   I discussed the assessment and treatment plan with the patient. The patient was provided an opportunity to ask questions and all were answered. The patient agreed with the plan and demonstrated an understanding of the instructions.   The patient was advised to call back or seek an in-person evaluation if the symptoms worsen or if the condition fails to improve as anticipated.  I provided 14 minutes of non-face-to-face time during this encounter.   Billie Ruddy, MD

## 2020-02-04 ENCOUNTER — Other Ambulatory Visit: Payer: Self-pay | Admitting: Family Medicine

## 2020-02-04 DIAGNOSIS — M810 Age-related osteoporosis without current pathological fracture: Secondary | ICD-10-CM

## 2020-02-04 MED ORDER — IBANDRONATE SODIUM 150 MG PO TABS: 150.0000 mg | ORAL_TABLET | ORAL | 0 refills | Status: DC

## 2020-02-04 NOTE — Telephone Encounter (Signed)
ibandronate (BONIVA) 150 MG tablet   [place], [place] - [address] Phone:  [phone]  Fax:  [phone]

## 2020-02-04 NOTE — Telephone Encounter (Signed)
Rx done. 

## 2020-04-21 LAB — HM MAMMOGRAPHY

## 2020-04-26 ENCOUNTER — Encounter: Payer: Self-pay | Admitting: Family Medicine

## 2020-05-10 ENCOUNTER — Other Ambulatory Visit: Payer: Self-pay

## 2020-05-10 ENCOUNTER — Encounter: Payer: Self-pay | Admitting: Family Medicine

## 2020-05-10 ENCOUNTER — Ambulatory Visit: Payer: POS | Admitting: Family Medicine

## 2020-05-10 VITALS — BP 102/76 | HR 86 | Temp 98.1°F | Resp 16 | Ht 66.0 in | Wt 138.8 lb

## 2020-05-10 DIAGNOSIS — M62838 Other muscle spasm: Secondary | ICD-10-CM

## 2020-05-10 DIAGNOSIS — M542 Cervicalgia: Secondary | ICD-10-CM

## 2020-05-10 MED ORDER — KETOROLAC TROMETHAMINE 60 MG/2ML IM SOLN
60.0000 mg | Freq: Once | INTRAMUSCULAR | Status: AC
  Administered 2020-05-10: 60 mg via INTRAMUSCULAR

## 2020-05-10 MED ORDER — CELECOXIB 100 MG PO CAPS: 100.0000 mg | ORAL_CAPSULE | Freq: Two times a day (BID) | ORAL | 0 refills | Status: AC

## 2020-05-10 MED ORDER — TRAMADOL HCL 50 MG PO TABS: 50.0000 mg | ORAL_TABLET | Freq: Every evening | ORAL | 0 refills | Status: AC | PRN

## 2020-05-10 MED ORDER — TIZANIDINE HCL 4 MG PO TABS: 2.0000 mg | ORAL_TABLET | Freq: Three times a day (TID) | ORAL | 0 refills | Status: AC | PRN

## 2020-05-10 NOTE — Progress Notes (Signed)
ACUTE VISIT Chief Complaint  Patient presents with  . Neck Pain   HPI: Ms.Brizeyda Jerilynn Martinez Tissue is a 59 y.o. female, who is here today complaining of a days of gradual onset left-sided neck pain while she was at work yesterday afternoon She thinsk she had it before but then she woke up with pain. Pain is achy, 10/10,constant. Sometimes radiated to left shoulder and arm. There is no numbness,tingling,burning ,or weakness. Exacerbated by neck movement and alleviated by rest. Getting worse.  No know trauma or unusual activity.  Aspirin given by a co-worker yesterday. She has not taken medication today.  Negative for fever,chills,sore throat,SOB,cough,or CP.  Pain is interfering with sleep.  Review of Systems  Constitutional: Positive for fatigue. Negative for appetite change.  Respiratory: Negative for chest tightness and wheezing.   Cardiovascular: Negative for palpitations and leg swelling.  Gastrointestinal: Negative for nausea and vomiting.  Musculoskeletal: Negative for gait problem and neck pain.  Skin: Negative for rash.  Neurological: Negative for syncope and headaches.  Hematological: Negative for adenopathy. Does not bruise/bleed easily.  Psychiatric/Behavioral: Positive for sleep disturbance. Negative for confusion.  Rest see pertinent positives and negatives per HPI.  Current Outpatient Medications on File Prior to Visit  Medication Sig Dispense Refill  . acetaminophen (TYLENOL) 500 MG tablet Take 500 mg by mouth every 6 (six) hours as needed for moderate pain.    . benzonatate (TESSALON) 100 MG capsule Take 1 capsule (100 mg total) by mouth 2 (two) times daily as needed for cough. 20 capsule 0  . Calcium Citrate (CITRACAL PO) Take by mouth.    . cetirizine (ZYRTEC) 10 MG tablet Take 1 tablet (10 mg total) by mouth daily. 30 tablet 3  . Chlorpheniramine-Phenylephrine (SUDAFED PE SINUS/ALLERGY PO) Take 1 tablet by mouth as needed.    . Cholecalciferol (D3-1000)  1000 units capsule Take 1,000 Units by mouth daily.    . fluticasone (FLONASE) 50 MCG/ACT nasal spray Place 1 spray into both nostrils daily. 16 g 2  . ibandronate (BONIVA) 150 MG tablet Take 1 tablet (150 mg total) by mouth every 30 (thirty) days. Take in morning with full glass of water, on empty stomach, do not take anything else by mouth or lie down for the next 60 min. 12 tablet 0  . Multiple Vitamins-Minerals (MULTIVITAMIN ADULT EXTRA C PO) multivitamin  Take 1 tablet as directed by oral route    . SALINE NASAL MIST NA Place into the nose as needed.    . Vitamin D, Ergocalciferol, (DRISDOL) 1.25 MG (50000 UT) CAPS capsule Take 1 capsule (50,000 Units total) by mouth every 7 (seven) days. 12 capsule 0   No current facility-administered medications on file prior to visit.     Past Medical History:  Diagnosis Date  . Osteoporosis    Allergies  Allergen Reactions  . Penicillins Anaphylaxis    Social History   Socioeconomic History  . Marital status: Single    Spouse name: Not on file  . Number of children: Not on file  . Years of education: Not on file  . Highest education level: Not on file  Occupational History  . Not on file  Tobacco Use  . Smoking status: Never Smoker  . Smokeless tobacco: Never Used  Vaping Use  . Vaping Use: Never used  Substance and Sexual Activity  . Alcohol use: Yes    Comment: socially   . Drug use: No  . Sexual activity: Not on file  Other Topics  Concern  . Not on file  Social History Narrative  . Not on file   Social Determinants of Health   Financial Resource Strain:   . Difficulty of Paying Living Expenses: Not on file  Food Insecurity:   . Worried About Charity fundraiser in the Last Year: Not on file  . Ran Out of Food in the Last Year: Not on file  Transportation Needs:   . Lack of Transportation (Medical): Not on file  . Lack of Transportation (Non-Medical): Not on file  Physical Activity:   . Days of Exercise per Week:  Not on file  . Minutes of Exercise per Session: Not on file  Stress:   . Feeling of Stress : Not on file  Social Connections:   . Frequency of Communication with Friends and Family: Not on file  . Frequency of Social Gatherings with Friends and Family: Not on file  . Attends Religious Services: Not on file  . Active Member of Clubs or Organizations: Not on file  . Attends Archivist Meetings: Not on file  . Marital Status: Not on file    Vitals:   05/10/20 1534  BP: 102/76  Pulse: 86  Resp: 16  Temp: 98.1 F (36.7 C)  SpO2: 98%   Body mass index is 22.4 kg/m.  Physical Exam Vitals and nursing note reviewed.  Constitutional:      General: She is not in acute distress.    Appearance: She is well-developed and normal weight. She is not ill-appearing.  HENT:     Head: Normocephalic and atraumatic.     Mouth/Throat:     Mouth: Mucous membranes are moist.  Eyes:     Conjunctiva/sclera: Conjunctivae normal.  Cardiovascular:     Rate and Rhythm: Normal rate and regular rhythm.     Heart sounds: No murmur heard.   Pulmonary:     Effort: Pulmonary effort is normal. No respiratory distress.  Musculoskeletal:     Cervical back: Spasms and tenderness present. No bony tenderness. Pain with movement present. Decreased range of motion (Limited right rotation and extension.).       Back:  Skin:    General: Skin is warm.     Findings: No erythema or rash.  Neurological:     Mental Status: She is alert and oriented to person, place, and time.   ASSESSMENT AND PLAN:  Breanna Martinez was seen today for neck pain.  Diagnoses and all orders for this visit:  Cervicalgia Here in the office and after verbal consent she received Toradol 60 mg IM, tolerated well, stayed 15 min. Celebrex to start in 8 hours. Some side effects of NSAID's discussed. Tramadol 50 mg at bedtime prn. I do not think imaging is needed at this time.  ROM exercises. Note for work. Instructed about  warning signs.  -     celecoxib (CELEBREX) 100 MG capsule; Take 1 capsule (100 mg total) by mouth 2 (two) times daily for 7 days. -     traMADol (ULTRAM) 50 MG tablet; Take 1 tablet (50 mg total) by mouth at bedtime as needed for up to 5 days for severe pain. -     ketorolac (TORADOL) injection 60 mg  Muscle spasm Local heat/ice and massage may help. Some side effects of muscle relaxants discussed.  -     tiZANidine (ZANAFLEX) 4 MG tablet; Take 0.5-1 tablets (2-4 mg total) by mouth every 8 (eight) hours as needed for up to 20 days for muscle  spasms.   Return in about 2 weeks (around 05/24/2020).   Jontavius Rabalais G. [place], MD  [facility]. [place] office.   A few things to remember from today's visit:   Cervicalgia - Plan: celecoxib (CELEBREX) 100 MG capsule, traMADol (ULTRAM) 50 MG tablet, tiZANidine (ZANAFLEX) 4 MG tablet  Some of this meds cause drowsiness. Range of motion exercises, local icy hot or asper cream and massage may help. Follow with Dr Volanda Napoleon in 2 weeks if not any better.  Please be sure medication list is accurate. If a new problem present, please set up appointment sooner than planned today.

## 2020-05-10 NOTE — Patient Instructions (Signed)
A few things to remember from today's visit:   Cervicalgia - Plan: celecoxib (CELEBREX) 100 MG capsule, traMADol (ULTRAM) 50 MG tablet, tiZANidine (ZANAFLEX) 4 MG tablet  Some of this meds cause drowsiness. Range of motion exercises, local icy hot or asper cream and massage may help. Follow with Dr Volanda Napoleon in 2 weeks if not any better.  Please be sure medication list is accurate. If a new problem present, please set up appointment sooner than planned today.

## 2020-05-16 ENCOUNTER — Telehealth: Payer: Self-pay | Admitting: Family Medicine

## 2020-05-16 NOTE — Telephone Encounter (Signed)
Pt got this script filled on 05/10/20. Her insurance company needs an Information systems manager for this script. She has already gotten this script filled. Please advise.

## 2020-05-16 NOTE — Telephone Encounter (Signed)
Pt is needing our office to call in an auth for this script celecoxib (CELEBREX) 100 MG capsule [[id number]]. The number is [phone]. Please advise at [phone].

## 2020-05-16 NOTE — Telephone Encounter (Signed)
Left a detailed message regarding her request for celebrex refil, Pt has an upcoming appointment for a CPE,  advise pt that Dr Volanda Napoleon will review er request at the visit, also advised pt to call the office with any question

## 2020-05-18 NOTE — Telephone Encounter (Signed)
Pt has appointment tomorrow with Dr Volanda Napoleon, state that she will discuss the medication issue at the visit

## 2020-05-19 ENCOUNTER — Other Ambulatory Visit: Payer: Self-pay

## 2020-05-19 ENCOUNTER — Ambulatory Visit (INDEPENDENT_AMBULATORY_CARE_PROVIDER_SITE_OTHER): Payer: POS | Admitting: Family Medicine

## 2020-05-19 ENCOUNTER — Encounter: Payer: Self-pay | Admitting: Family Medicine

## 2020-05-19 VITALS — BP 106/78 | HR 82 | Temp 98.2°F | Wt 137.6 lb

## 2020-05-19 DIAGNOSIS — Z0000 Encounter for general adult medical examination without abnormal findings: Secondary | ICD-10-CM

## 2020-05-19 DIAGNOSIS — Z1211 Encounter for screening for malignant neoplasm of colon: Secondary | ICD-10-CM | POA: Diagnosis not present

## 2020-05-19 DIAGNOSIS — D1722 Benign lipomatous neoplasm of skin and subcutaneous tissue of left arm: Secondary | ICD-10-CM | POA: Diagnosis not present

## 2020-05-19 DIAGNOSIS — E119 Type 2 diabetes mellitus without complications: Secondary | ICD-10-CM

## 2020-05-19 DIAGNOSIS — L720 Epidermal cyst: Secondary | ICD-10-CM

## 2020-05-19 DIAGNOSIS — M810 Age-related osteoporosis without current pathological fracture: Secondary | ICD-10-CM

## 2020-05-19 NOTE — Patient Instructions (Addendum)
Preventive Care 40-59 Years Old, Female Preventive care refers to visits with your health care provider and lifestyle choices that can promote health and wellness. This includes:  A yearly physical exam. This may also be called an annual well check.  Regular dental visits and eye exams.  Immunizations.  Screening for certain conditions.  Healthy lifestyle choices, such as eating a healthy diet, getting regular exercise, not using drugs or products that contain nicotine and tobacco, and limiting alcohol use. What can I expect for my preventive care visit? Physical exam Your health care provider will check your:  Height and weight. This may be used to calculate body mass index (BMI), which tells if you are at a healthy weight.  Heart rate and blood pressure.  Skin for abnormal spots. Counseling Your health care provider may ask you questions about your:  Alcohol, tobacco, and drug use.  Emotional well-being.  Home and relationship well-being.  Sexual activity.  Eating habits.  Work and work environment.  Method of birth control.  Menstrual cycle.  Pregnancy history. What immunizations do I need?  Influenza (flu) vaccine  This is recommended every year. Tetanus, diphtheria, and pertussis (Tdap) vaccine  You may need a Td booster every 10 years. Varicella (chickenpox) vaccine  You may need this if you have not been vaccinated. Zoster (shingles) vaccine  You may need this after age 60. Measles, mumps, and rubella (MMR) vaccine  You may need at least one dose of MMR if you were born in 1957 or later. You may also need a second dose. Pneumococcal conjugate (PCV13) vaccine  You may need this if you have certain conditions and were not previously vaccinated. Pneumococcal polysaccharide (PPSV23) vaccine  You may need one or two doses if you smoke cigarettes or if you have certain conditions. Meningococcal conjugate (MenACWY) vaccine  You may need this if you  have certain conditions. Hepatitis A vaccine  You may need this if you have certain conditions or if you travel or work in places where you may be exposed to hepatitis A. Hepatitis B vaccine  You may need this if you have certain conditions or if you travel or work in places where you may be exposed to hepatitis B. Haemophilus influenzae type b (Hib) vaccine  You may need this if you have certain conditions. Human papillomavirus (HPV) vaccine  If recommended by your health care provider, you may need three doses over 6 months. You may receive vaccines as individual doses or as more than one vaccine together in one shot (combination vaccines). Talk with your health care provider about the risks and benefits of combination vaccines. What tests do I need? Blood tests  Lipid and cholesterol levels. These may be checked every 5 years, or more frequently if you are over 50 years old.  Hepatitis C test.  Hepatitis B test. Screening  Lung cancer screening. You may have this screening every year starting at age 55 if you have a 30-pack-year history of smoking and currently smoke or have quit within the past 15 years.  Colorectal cancer screening. All adults should have this screening starting at age 50 and continuing until age 75. Your health care provider may recommend screening at age 45 if you are at increased risk. You will have tests every 1-10 years, depending on your results and the type of screening test.  Diabetes screening. This is done by checking your blood sugar (glucose) after you have not eaten for a while (fasting). You may have this   done every 1-3 years.  Mammogram. This may be done every 1-2 years. Talk with your health care provider about when you should start having regular mammograms. This may depend on whether you have a family history of breast cancer.  BRCA-related cancer screening. This may be done if you have a family history of breast, ovarian, tubal, or peritoneal  cancers.  Pelvic exam and Pap test. This may be done every 3 years starting at age 54. Starting at age 51, this may be done every 5 years if you have a Pap test in combination with an HPV test. Other tests  Sexually transmitted disease (STD) testing.  Bone density scan. This is done to screen for osteoporosis. You may have this scan if you are at high risk for osteoporosis. Follow these instructions at home: Eating and drinking  Eat a diet that includes fresh fruits and vegetables, whole grains, lean protein, and low-fat dairy.  Take vitamin and mineral supplements as recommended by your health care provider.  Do not drink alcohol if: ? Your health care provider tells you not to drink. ? You are pregnant, may be pregnant, or are planning to become pregnant.  If you drink alcohol: ? Limit how much you have to 0-1 drink a day. ? Be aware of how much alcohol is in your drink. In the [place] one drink equals one 12 oz bottle of beer (355 mL), one 5 oz glass of wine (148 mL), or one 1 oz glass of hard liquor (44 mL). Lifestyle  Take daily care of your teeth and gums.  Stay active. Exercise for at least 30 minutes on 5 or more days each week.  Do not use any products that contain nicotine or tobacco, such as cigarettes, e-cigarettes, and chewing tobacco. If you need help quitting, ask your health care provider.  If you are sexually active, practice safe sex. Use a condom or other form of birth control (contraception) in order to prevent pregnancy and STIs (sexually transmitted infections).  If told by your health care provider, take low-dose aspirin daily starting at age 43. What's next?  Visit your health care provider once a year for a well check visit.  Ask your health care provider how often you should have your eyes and teeth checked.  Stay up to date on all vaccines. This information is not intended to replace advice given to you by your health care provider. Make sure you  discuss any questions you have with your health care provider. Document Revised: 04/03/2018 Document Reviewed: 04/03/2018 Elsevier Patient Education  [place].  Osteoporosis  Osteoporosis happens when your bones get thin and weak. This can cause your bones to break (fracture) more easily. You can do things at home to make your bones stronger. Follow these instructions at home:  Activity  Exercise as told by your doctor. Ask your doctor what activities are safe for you. You should do: ? Exercises that make your muscles work to hold your body weight up (weight-bearing exercises). These include tai chi, yoga, and walking. ? Exercises to make your muscles stronger. One example is lifting weights. Lifestyle  Limit alcohol intake to no more than 1 drink a day for nonpregnant women and 2 drinks a day for men. One drink equals 12 oz of beer, 5 oz of wine, or 1 oz of hard liquor.  Do not use any products that have nicotine or tobacco in them. These include cigarettes and e-cigarettes. If you need help quitting, ask your  doctor. Preventing falls  Use tools to help you move around (mobility aids) as needed. These include canes, walkers, scooters, and crutches.  Keep rooms well-lit and free of clutter.  Put away things that could make you trip. These include cords and rugs.  Install safety rails on stairs. Install grab bars in bathrooms.  Use rubber mats in slippery areas, like bathrooms.  Wear shoes that: ? Fit you well. ? Support your feet. ? Have closed toes. ? Have rubber soles or low heels.  Tell your doctor about all of the medicines you are taking. Some medicines can make you more likely to fall. General instructions  Eat plenty of calcium and vitamin D. These nutrients are good for your bones. Good sources of calcium and vitamin D include: ? Some fatty fish, such as salmon and tuna. ? Foods that have calcium and vitamin D added to them (fortified foods). For example,  some breakfast cereals are fortified with calcium and vitamin D. ? Egg yolks. ? Cheese. ? Liver.  Take over-the-counter and prescription medicines only as told by your doctor.  Keep all follow-up visits as told by your doctor. This is important. Contact a doctor if:  You have not been tested (screened) for osteoporosis and you are: ? A woman who is age 25 or older. ? A man who is age 56 or older. Get help right away if:  You fall.  You get hurt. Summary  Osteoporosis happens when your bones get thin and weak.  Weak bones can break (fracture) more easily.  Eat plenty of calcium and vitamin D. These nutrients are good for your bones.  Tell your doctor about all of the medicines that you take. This information is not intended to replace advice given to you by your health care provider. Make sure you discuss any questions you have with your health care provider. Document Revised: 07/05/2017 Document Reviewed: 05/17/2017 Elsevier Patient Education  [place].  Lipoma  A lipoma is a noncancerous (benign) tumor that is made up of fat cells. This is a very common type of soft-tissue growth. Lipomas are usually found under the skin (subcutaneous). They may occur in any tissue of the body that contains fat. Common areas for lipomas to appear include the back, arms, shoulders, buttocks, and thighs. Lipomas grow slowly, and they are usually painless. Most lipomas do not cause problems and do not require treatment. What are the causes? The cause of this condition is not known. What increases the risk? You are more likely to develop this condition if:  You are 79-27 years old.  You have a family history of lipomas. What are the signs or symptoms? A lipoma usually appears as a small, round bump under the skin. In most cases, the lump will:  Feel soft or rubbery.  Not cause pain or other symptoms. However, if a lipoma is located in an area where it pushes on nerves, it can  become painful or cause other symptoms. How is this diagnosed? A lipoma can usually be diagnosed with a physical exam. You may also have tests to confirm the diagnosis and to rule out other conditions. Tests may include:  Imaging tests, such as a CT scan or an MRI.  Removal of a tissue sample to be looked at under a microscope (biopsy). How is this treated? Treatment for this condition depends on the size of the lipoma and whether it is causing any symptoms.  For small lipomas that are not causing problems, no  treatment is needed.  If a lipoma is bigger or it causes problems, surgery may be done to remove the lipoma. Lipomas can also be removed to improve appearance. Most often, the procedure is done after applying a medicine that numbs the area (local anesthetic).  Liposuction may be done to reduce the size of the lipoma before it is removed through surgery, or it may be done to remove the lipoma. Lipomas are removed with this method in order to limit incision size and scarring. A liposuction tube is inserted through a small incision into the lipoma, and the contents of the lipoma are removed through the tube with suction. Follow these instructions at home:  Watch your lipoma for any changes.  Keep all follow-up visits as told by your health care provider. This is important. Contact a health care provider if:  Your lipoma becomes larger or hard.  Your lipoma becomes painful, red, or increasingly swollen. These could be signs of infection or a more serious condition. Get help right away if:  You develop tingling or numbness in an area near the lipoma. This could indicate that the lipoma is causing nerve damage. Summary  A lipoma is a noncancerous tumor that is made up of fat cells.  Most lipomas do not cause problems and do not require treatment.  If a lipoma is bigger or it causes problems, surgery may be done to remove the lipoma.  Contact a health care provider if your lipoma  becomes larger or hard, or if it becomes painful, red, or increasingly swollen. Pain, redness, and swelling could be signs of infection or a more serious condition. This information is not intended to replace advice given to you by your health care provider. Make sure you discuss any questions you have with your health care provider. Document Revised: 03/09/2019 Document Reviewed: 03/09/2019 Elsevier Patient Education  [place].  Epidermal Cyst  An epidermal cyst is a sac made of skin tissue. The sac contains a substance called keratin. Keratin is a protein that is normally secreted through the hair follicles. When keratin becomes trapped in the top layer of skin (epidermis), it can form an epidermal cyst. Epidermal cysts can be found anywhere on your body. These cysts are usually harmless (benign), and they may not cause symptoms unless they become infected. What are the causes? This condition may be caused by:  A blocked hair follicle.  A hair that curls and re-enters the skin instead of growing straight out of the skin (ingrown hair).  A blocked pore.  Irritated skin.  An injury to the skin.  Certain conditions that are passed along from parent to child (inherited).  Human papillomavirus (HPV).  Long-term (chronic) sun damage to the skin. What increases the risk? The following factors may make you more likely to develop an epidermal cyst:  Having acne.  Being overweight.  Being 47-79 years old. What are the signs or symptoms? The only symptom of this condition may be a small, painless lump underneath the skin. When an epidermal cyst ruptures, it may become infected. Symptoms may include:  Redness.  Inflammation.  Tenderness.  Warmth.  Fever.  Keratin draining from the cyst. Keratin is grayish-white, bad-smelling substance.  Pus draining from the cyst. How is this diagnosed? This condition is diagnosed with a physical exam.  In some cases, you may  have a sample of tissue (biopsy) taken from your cyst to be examined under a microscope or tested for bacteria.  You may be referred  to a health care provider who specializes in skin care (dermatologist). How is this treated? In many cases, epidermal cysts go away on their own without treatment. If a cyst becomes infected, treatment may include:  Opening and draining the cyst, done by a health care provider. After draining, minor surgery to remove the rest of the cyst may be done.  Antibiotic medicine.  Injections of medicines (steroids) that help to reduce inflammation.  Surgery to remove the cyst. Surgery may be done if the cyst: ? Becomes large. ? Bothers you. ? Has a chance of turning into cancer.  Do not try to open a cyst yourself. Follow these instructions at home:  Take over-the-counter and prescription medicines only as told by your health care provider.  If you were prescribed an antibiotic medicine, take it it as told by your health care provider. Do not stop using the antibiotic even if you start to feel better.  Keep the area around your cyst clean and dry.  Wear loose, dry clothing.  Avoid touching your cyst.  Check your cyst every day for signs of infection. Check for: ? Redness, swelling, or pain. ? Fluid or blood. ? Warmth. ? Pus or a bad smell.  Keep all follow-up visits as told by your health care provider. This is important. How is this prevented?  Wear clean, dry, clothing.  Avoid wearing tight clothing.  Keep your skin clean and dry. Take showers or baths every day. Contact a health care provider if:  Your cyst develops symptoms of infection.  Your condition is not improving or is getting worse.  You develop a cyst that looks different from other cysts you have had.  You have a fever. Get help right away if:  Redness spreads from the cyst into the surrounding area. Summary  An epidermal cyst is a sac made of skin tissue. These cysts are  usually harmless (benign), and they may not cause symptoms unless they become infected.  If a cyst becomes infected, treatment may include surgery to open and drain the cyst, or to remove it. Treatment may also include medicines by mouth or through an injection.  Take over-the-counter and prescription medicines only as told by your health care provider. If you were prescribed an antibiotic medicine, take it as told by your health care provider. Do not stop using the antibiotic even if you start to feel better.  Contact a health care provider if your condition is not improving or is getting worse.  Keep all follow-up visits as told by your health care provider. This is important. This information is not intended to replace advice given to you by your health care provider. Make sure you discuss any questions you have with your health care provider. Document Revised: 11/13/2018 Document Reviewed: 02/03/2018 Elsevier Patient Education  2020 [place].  Epidermal Cyst Removal  Epidermal cyst removal is a procedure to remove a sac of oily material (keratin) that has formed under your skin (epidermal cyst). Epidermal cysts may also be called epidermoid cysts, sebaceous cysts, or keratin cysts. Normally, the skin secretes this oily material through a gland or a hair follicle. However, when a skin gland or hair follicle becomes blocked, an epidermal cyst can form. You may need this procedure if you have an epidermal cyst that becomes large, uncomfortable, or infected. Tell a health care provider about:  Any allergies you have.  All medicines you are taking, including vitamins, herbs, eye drops, creams, and over-the-counter medicines.  Any problems you  or family members have had with anesthetic medicines.  Any blood disorders you have.  Any surgeries you have had.  Any medical conditions you have now or have had.  Whether you are pregnant or may be pregnant. What are the risks? Generally,  this is a safe procedure. However, problems may occur, including:  Development of another cyst.  Bleeding.  Infection.  Scarring. What happens before the procedure?  Ask your health care provider about: ? Changing or stopping your regular medicines. This is especially important if you are taking diabetes medicines or blood thinners. ? Taking medicines such as aspirin and ibuprofen. These medicines can thin your blood. Do not take these medicines unless your health care provider tells you to take them. ? Taking over-the-counter medicines, vitamins, herbs, and supplements.  If you have an infected cyst, you may have to take antibiotic medicine before the cyst removal. Take your antibiotic as told by your health care provider. Do not stop taking the antibiotic even if you start to feel better.  Take a shower on the morning of your procedure. Your health care provider may ask you to use a germ-killing soap. What happens during the procedure?   You will be given a medicine to numb the area (local anesthetic).  The skin around the cyst will be cleaned with a germ-killing solution.  The health care provider will make a small incision in your skin over the cyst.  The health care provider will separate the cyst from the surrounding tissues that are under your skin.  If possible, the cyst will be removed undamaged (intact).  If the cyst bursts (ruptures), it will be removed in pieces.  After the cyst is removed, the health care provider will control any bleeding and close the incision with small stitches (sutures). Small incisions may not need sutures, and the bleeding will be controlled by applying direct pressure with gauze.  The health care provider may apply antibiotic ointment and a bandage (dressing) over the incision. The procedure may vary among health care providers and hospitals. What happens after the procedure?  If your cyst ruptured during the procedure, you may need an  antibiotic. If you are prescribed an antibiotic medicine or ointment, take or apply it as told by your health care provider. Do not stop using the antibiotic even if you start to feel better. Summary  Epidermal cyst removal is a procedure to remove a sac of oily material (keratin) that has formed under your skin (epidermal cyst).  You may need this procedure if you have an epidermal cyst that becomes large, uncomfortable, or infected.  The health care provider will make a small incision in your skin to remove the cyst.  If you are prescribed an antibiotic medicine before the procedure, after the procedure, or both, use the antibiotic as told by your health care provider. Do not stop using the antibiotic even if you start to feel better. This information is not intended to replace advice given to you by your health care provider. Make sure you discuss any questions you have with your health care provider. Document Revised: 11/13/2018 Document Reviewed: 05/16/2017 Elsevier Patient Education  [place].

## 2020-05-19 NOTE — Progress Notes (Signed)
Subjective:     Breanna Martinez is a 59 y.o. female and is here for a comprehensive physical exam. Pt is working less overtime at the [employer].  She now has the wknds off.  Pt taking care of her mother who has dementia.  Pt was seen in office last wk for neck pain, given celebrex, tramadol, zanaflex and a toradol inj in office. Pt states one of the meds caused her to feel "high" after taking a 1/2 tab.  Pt notes improvement in neck.  Pt concerned about swelling on left shoulder.  Does not recall how long the area has been present.  It is not painful.  Also mentions a bump in the left scalp near temple that often drains smelly debris. Pt concerned about her weight loss since making lifestyle changes.   Patient has not had a colonoscopy.  Inquires about repeat bone density scan.  Social History   Socioeconomic History  . Marital status: Single    Spouse name: Not on file  . Number of children: Not on file  . Years of education: Not on file  . Highest education level: Not on file  Occupational History  . Not on file  Tobacco Use  . Smoking status: Never Smoker  . Smokeless tobacco: Never Used  Vaping Use  . Vaping Use: Never used  Substance and Sexual Activity  . Alcohol use: Yes    Comment: socially   . Drug use: No  . Sexual activity: Not on file  Other Topics Concern  . Not on file  Social History Narrative  . Not on file   Social Determinants of Health   Financial Resource Strain:   . Difficulty of Paying Living Expenses: Not on file  Food Insecurity:   . Worried About Charity fundraiser in the Last Year: Not on file  . Ran Out of Food in the Last Year: Not on file  Transportation Needs:   . Lack of Transportation (Medical): Not on file  . Lack of Transportation (Non-Medical): Not on file  Physical Activity:   . Days of Exercise per Week: Not on file  . Minutes of Exercise per Session: Not on file  Stress:   . Feeling of Stress : Not on file  Social Connections:    . Frequency of Communication with Friends and Family: Not on file  . Frequency of Social Gatherings with Friends and Family: Not on file  . Attends Religious Services: Not on file  . Active Member of Clubs or Organizations: Not on file  . Attends Archivist Meetings: Not on file  . Marital Status: Not on file  Intimate Partner Violence:   . Fear of Current or Ex-Partner: Not on file  . Emotionally Abused: Not on file  . Physically Abused: Not on file  . Sexually Abused: Not on file   Health Maintenance  Topic Date Due  . Hepatitis C Screening  Never done  . COVID-19 Vaccine (1) Never done  . HIV Screening  Never done  . PAP SMEAR-Modifier  Never done  . COLONOSCOPY  Never done  . INFLUENZA VACCINE  Never done  . URINE MICROALBUMIN  04/14/2020  . MAMMOGRAM  04/21/2022  . TETANUS/TDAP  10/08/2024    The following portions of the patient's history were reviewed and updated as appropriate: allergies, current medications, past family history, past medical history, past social history, past surgical history and problem list.  Review of Systems Pertinent items noted in HPI  and remainder of comprehensive ROS otherwise negative.   Objective:    BP 106/78 (BP Location: Right Arm, Patient Position: Sitting, Cuff Size: Normal)   Pulse 82   Temp 98.2 F (36.8 C) (Oral)   Wt 137 lb 9.6 oz (62.4 kg)   SpO2 97%   BMI 22.21 kg/m  General appearance: alert, cooperative and no distress Head: Normocephalic, without obvious abnormality, atraumatic Eyes: conjunctivae/corneas clear. PERRL, EOM's intact. Fundi benign. Ears: normal TM's and external ear canals both ears Nose: Nares normal. Septum midline. Mucosa normal. No drainage or sinus tenderness. Throat: lips, mucosa, and tongue normal; teeth and gums normal Neck: no adenopathy, no carotid bruit, no JVD, supple, symmetrical, trachea midline and thyroid not enlarged, symmetric, no tenderness/mass/nodules Lungs: clear to  auscultation bilaterally Heart: regular rate and rhythm, S1, S2 normal, no murmur, click, rub or gallop Abdomen: soft, non-tender; bowel sounds normal; no masses,  no organomegaly Extremities: extremities normal, atraumatic, no cyanosis or edema Pulses: 2+ and symmetric Skin: Skin color, texture, turgor normal. No rashes or lesions or lipoma on L anterior shoulder.  A flat, whitish apparing lesion with a hyperpigmented central punctum in scalp proximal to L temple.  No erythema, induration, or drainage noted from area. Lymph nodes: Cervical, supraclavicular, and axillary nodes normal. Neurologic: Alert and oriented X 3, normal strength and tone. Normal symmetric reflexes. Normal coordination and gait    Assessment:    Healthy female exam.      Plan:     Anticipatory guidance given including wearing seatbelts, smoke detectors in the home, increasing physical activity, increasing p.o. intake of water and vegetables. -will obtain labs -Mammogram up-to-date.  Done 04/21/20 -pap done with OB/Gyn -discussed colonoscopy.  Referral placed this visit -influenza vaccine offered.   -given handout -next CPE in 1 yr See After Visit Summary for Counseling Recommendations    Type 2 diabetes mellitus without complication, without long-term current use of insulin ([place])  -diet controlled -hgb A1C 6.9% on 04/15/2019 -discussed continuing lifestyle modifications -for increased A1C will start low dose medication such as metformin. -eye exam encouraged. - Plan: Lipid panel, Hemoglobin A1c, Microalbumin/Creatinine Ratio  Lipoma of left upper extremity  -discussed options including removal -given handout - Plan: Ambulatory referral to Dermatology  Epidermoid cyst of skin of scalp  -discussed removal -given handout - Plan: Ambulatory referral to Dermatology  Colon cancer screening  - Plan: Ambulatory referral to Gastroenterology  Age related osteoporosis, unspecified pathological fracture presence   - Plan: DG Bone Density  F/u prn  Grier Mitts, MD

## 2020-05-20 LAB — MICROALBUMIN / CREATININE URINE RATIO
Creatinine, Urine: 83 mg/dL (ref 20–275)
Microalb Creat Ratio: 10 mcg/mg creat (ref <30)
Microalb, Ur: 0.8 mg/dL

## 2020-05-20 LAB — BASIC METABOLIC PANEL WITH GFR
BUN: 18 mg/dL (ref 7–25)
CO2: 25 mmol/L (ref 20–32)
Calcium: 9.8 mg/dL (ref 8.6–10.4)
Chloride: 105 mmol/L (ref 98–110)
Creat: 0.57 mg/dL (ref 0.50–1.05)
GFR, Est African American: 118 mL/min/{1.73_m2} (ref >=60)
GFR, Est Non African American: 101 mL/min/{1.73_m2} (ref >=60)
Glucose, Bld: 113 mg/dL — ABNORMAL HIGH (ref 65–99)
Potassium: 4.1 mmol/L (ref 3.5–5.3)
Sodium: 140 mmol/L (ref 135–146)

## 2020-05-20 LAB — CBC WITH DIFFERENTIAL/PLATELET
Absolute Monocytes: 462 cells/uL (ref 200–950)
Basophils Absolute: 68 cells/uL (ref 0–200)
Basophils Relative: 1.2 %
Eosinophils Absolute: 143 cells/uL (ref 15–500)
Eosinophils Relative: 2.5 %
HCT: 37.9 % (ref 35.0–45.0)
Hemoglobin: 11.8 g/dL (ref 11.7–15.5)
Lymphs Abs: 2394 cells/uL (ref 850–3900)
MCH: 27.7 pg (ref 27.0–33.0)
MCHC: 31.1 g/dL — ABNORMAL LOW (ref 32.0–36.0)
MCV: 89 fL (ref 80.0–100.0)
MPV: 11.4 fL (ref 7.5–12.5)
Monocytes Relative: 8.1 %
Neutro Abs: 2633 cells/uL (ref 1500–7800)
Neutrophils Relative %: 46.2 %
Platelets: 323 10*3/uL (ref 140–400)
RBC: 4.26 10*6/uL (ref 3.80–5.10)
RDW: 12.5 % (ref 11.0–15.0)
Total Lymphocyte: 42 %
WBC: 5.7 10*3/uL (ref 3.8–10.8)

## 2020-05-20 LAB — LIPID PANEL
Cholesterol: 179 mg/dL (ref <200)
HDL: 63 mg/dL (ref >=50)
LDL Cholesterol (Calc): 97 mg/dL (calc)
Non-HDL Cholesterol (Calc): 116 mg/dL (calc) (ref <130)
Total CHOL/HDL Ratio: 2.8 (calc) (ref <5.0)
Triglycerides: 93 mg/dL (ref <150)

## 2020-05-20 LAB — HEMOGLOBIN A1C
Hgb A1c MFr Bld: 6.9 % of total Hgb — ABNORMAL HIGH (ref <5.7)
Mean Plasma Glucose: 151 (calc)
eAG (mmol/L): 8.4 (calc)

## 2020-05-24 ENCOUNTER — Encounter: Payer: Self-pay | Admitting: Family Medicine

## 2020-05-25 ENCOUNTER — Other Ambulatory Visit: Payer: Self-pay

## 2020-05-25 DIAGNOSIS — M810 Age-related osteoporosis without current pathological fracture: Secondary | ICD-10-CM

## 2020-05-25 MED ORDER — IBANDRONATE SODIUM 150 MG PO TABS: 150.0000 mg | ORAL_TABLET | ORAL | 0 refills | Status: DC

## 2020-07-20 ENCOUNTER — Encounter: Payer: Self-pay | Admitting: Plastic Surgery

## 2020-07-20 ENCOUNTER — Ambulatory Visit (INDEPENDENT_AMBULATORY_CARE_PROVIDER_SITE_OTHER): Payer: POS | Admitting: Plastic Surgery

## 2020-07-20 ENCOUNTER — Other Ambulatory Visit: Payer: Self-pay

## 2020-07-20 VITALS — BP 113/72 | HR 69 | Temp 97.8°F | Ht 66.0 in | Wt 136.0 lb

## 2020-07-20 DIAGNOSIS — D1722 Benign lipomatous neoplasm of skin and subcutaneous tissue of left arm: Secondary | ICD-10-CM | POA: Diagnosis not present

## 2020-07-20 NOTE — Progress Notes (Signed)
   Referring Provider Billie Ruddy, MD [place],  [place] [address]   CC:  Chief Complaint  Patient presents with  . Advice Only      Breanna Martinez is an 59 y.o. female.  HPI: Patient presents for a new mass on her left shoulder.  Its been present for the last few months and she is uncertain of exactly how long its been there.  Its growing in size and she is worried that it could be something malignant.  She would like to have it removed.  Allergies  Allergen Reactions  . Penicillins Anaphylaxis    Outpatient Encounter Medications as of 07/20/2020  Medication Sig  . acetaminophen (TYLENOL) 500 MG tablet Take 500 mg by mouth every 6 (six) hours as needed for moderate pain.  . benzonatate (TESSALON) 100 MG capsule Take 1 capsule (100 mg total) by mouth 2 (two) times daily as needed for cough.  . Calcium Citrate (CITRACAL PO) Take by mouth.  . cetirizine (ZYRTEC) 10 MG tablet Take 1 tablet (10 mg total) by mouth daily.  . Chlorpheniramine-Phenylephrine (SUDAFED PE SINUS/ALLERGY PO) Take 1 tablet by mouth as needed.  . Cholecalciferol (D3-1000) 1000 units capsule Take 1,000 Units by mouth daily.  . fluticasone (FLONASE) 50 MCG/ACT nasal spray Place 1 spray into both nostrils daily.  Marland Kitchen ibandronate (BONIVA) 150 MG tablet Take 1 tablet (150 mg total) by mouth every 30 (thirty) days. Take in morning with full glass of water, on empty stomach, do not take anything else by mouth or lie down for the next 60 min.  . Multiple Vitamins-Minerals (MULTIVITAMIN ADULT EXTRA C PO) multivitamin  Take 1 tablet as directed by oral route  . SALINE NASAL MIST NA Place into the nose as needed.  . Vitamin D, Ergocalciferol, (DRISDOL) 1.25 MG (50000 UT) CAPS capsule Take 1 capsule (50,000 Units total) by mouth every 7 (seven) days.   No facility-administered encounter medications on file as of 07/20/2020.     Past Medical History:  Diagnosis Date  . Osteoporosis     Past  Surgical History:  Procedure Laterality Date  . TOE SURGERY      Family History  Problem Relation Age of Onset  . Diabetes Mother   . Glaucoma Mother   . Prostate cancer Father     Social History   Social History Narrative  . Not on file     Review of Systems General: Denies fevers, chills, weight loss CV: Denies chest pain, shortness of breath, palpitations  Physical Exam Vitals with BMI 07/20/2020 05/19/2020 05/10/2020  Height 5\' 6"  - 5\' 6"   Weight 136 lbs 137 lbs 10 oz 138 lbs 13 oz  BMI 21.96 28.76 81.15  Systolic 726 203 559  Diastolic 72 78 76  Pulse 69 82 86    General:  No acute distress,  Alert and oriented, Non-Toxic, Normal speech and affect Examination shows a 3 cm subcutaneous mass in the anterior aspect of the left shoulder.  Is freely mobile with no overlying skin changes.  Assessment/Plan Patient has what is likely a subcutaneous lipoma in the left shoulder.  It is amenable to excision.  We discussed the risks include bleeding, infection, demonstrating structures and need for additional procedures.  We will move forward with excising this in the office under local.  All of her questions were answered.  Cindra Presume 07/20/2020, 6:25 PM

## 2020-07-28 ENCOUNTER — Encounter: Payer: Self-pay | Admitting: Family Medicine

## 2020-07-28 ENCOUNTER — Other Ambulatory Visit: Payer: Self-pay

## 2020-07-28 ENCOUNTER — Ambulatory Visit: Payer: POS | Admitting: Family Medicine

## 2020-07-28 VITALS — BP 112/70 | HR 73 | Temp 98.7°F | Ht 66.0 in | Wt 141.0 lb

## 2020-07-28 DIAGNOSIS — L309 Dermatitis, unspecified: Secondary | ICD-10-CM | POA: Diagnosis not present

## 2020-07-28 MED ORDER — TRIAMCINOLONE ACETONIDE 0.1 % EX CREA: 1.0000 "application " | TOPICAL_CREAM | Freq: Two times a day (BID) | CUTANEOUS | 0 refills | Status: DC

## 2020-07-28 NOTE — Progress Notes (Signed)
   Subjective:    Patient ID: Breanna Martinez, female    DOB: Jun 17, 1961, 59 y.o.   MRN: [id number]  HPI Here for 3 days of an itchy rash on both thighs. The only thing different in her routine is using a different brand of dryer sheets this week.    Review of Systems  Constitutional: Negative.   Respiratory: Negative.   Cardiovascular: Negative.   Skin: Positive for rash.       Objective:   Physical Exam Constitutional:      Appearance: Normal appearance.  Cardiovascular:     Rate and Rhythm: Normal rate and regular rhythm.     Pulses: Normal pulses.     Heart sounds: Normal heart sounds.  Pulmonary:     Effort: Pulmonary effort is normal.     Breath sounds: Normal breath sounds.  Skin:    Comments: Both anterior thighs have areas of macular erythema   Neurological:     Mental Status: She is alert.           Assessment & Plan:  Dermatitis, treat with Triamcinolone cream. Recheck prn.  Alysia Penna, MD

## 2020-08-02 ENCOUNTER — Telehealth: Payer: Self-pay | Admitting: Family Medicine

## 2020-08-02 NOTE — Telephone Encounter (Signed)
Pharmacy needs to now if the Triamcinolone is a cream or ointment before they can dispense it to patient. She is leaving to go out of town tomorrow at 2:00 pm.  Please advise. Thanks. Dm/cma

## 2020-08-02 NOTE — Telephone Encounter (Signed)
Called [facility] to verified Rx Triamcinolone--cream

## 2020-08-02 NOTE — Telephone Encounter (Signed)
This is for the cream

## 2020-08-02 NOTE — Telephone Encounter (Signed)
Pt is calling back to check the status of the below msg and stated that she would like to get it today b/c she is going to be going out of town tomorrow.

## 2020-08-02 NOTE — Telephone Encounter (Signed)
Pt is calling in stating that she is waiting along with the pharmacy to see if the medication Rx triamcinolone (KENALOG) that was called in on 07/28/2020 and they need to see if it is a cream or ointment.  Pharm:  [facility] on [address]

## 2020-08-11 ENCOUNTER — Other Ambulatory Visit: Payer: Self-pay

## 2020-08-11 ENCOUNTER — Ambulatory Visit (AMBULATORY_SURGERY_CENTER): Payer: Self-pay

## 2020-08-11 VITALS — Ht 66.0 in | Wt 140.0 lb

## 2020-08-11 DIAGNOSIS — Z1211 Encounter for screening for malignant neoplasm of colon: Secondary | ICD-10-CM

## 2020-08-11 MED ORDER — NA SULFATE-K SULFATE-MG SULF 17.5-3.13-1.6 GM/177ML PO SOLN: 1.0000 | Freq: Once | ORAL | 0 refills | Status: AC

## 2020-08-11 NOTE — Progress Notes (Signed)
No allergies to soy or egg Pt is not on blood thinners or diet pills Denies issues with sedation/intubation Denies atrial flutter/fib Denies constipation   Emmi instructions given to pt  Pt is aware of Covid safety and care partner requirements.  

## 2020-08-18 ENCOUNTER — Other Ambulatory Visit (HOSPITAL_COMMUNITY)
Admission: RE | Admit: 2020-08-18 | Discharge: 2020-08-18 | Disposition: A | Discharge status: Home / Self Care | Payer: Federal, State, Local not specified - PPO | Source: Ambulatory Visit | Attending: Plastic Surgery | Admitting: Plastic Surgery

## 2020-08-18 ENCOUNTER — Encounter: Payer: Self-pay | Admitting: Plastic Surgery

## 2020-08-18 ENCOUNTER — Other Ambulatory Visit: Payer: Self-pay

## 2020-08-18 ENCOUNTER — Ambulatory Visit: Payer: Federal, State, Local not specified - PPO | Admitting: Plastic Surgery

## 2020-08-18 VITALS — BP 108/71 | HR 75

## 2020-08-18 DIAGNOSIS — D1722 Benign lipomatous neoplasm of skin and subcutaneous tissue of left arm: Secondary | ICD-10-CM | POA: Insufficient documentation

## 2020-08-18 NOTE — Progress Notes (Signed)
Operative Note   DATE OF OPERATION: 08/18/2020  LOCATION:    SURGICAL DEPARTMENT: Plastic Surgery  PREOPERATIVE DIAGNOSES: Left shoulder lipoma  POSTOPERATIVE DIAGNOSES:  same  PROCEDURE:  1. Excision of left shoulder lipoma subcutaneous measuring 3 cm 2. Complex closure measuring 3 cm  SURGEON: Talmadge Coventry, MD  ANESTHESIA:  Local  COMPLICATIONS: None.   INDICATIONS FOR PROCEDURE:  The patient, Breanna Martinez is a 59 y.o. female born on 06-May-1961, is here for treatment of left shoulder lipoma MRN: [id number]  CONSENT:  Informed consent was obtained directly from the patient. Risks, benefits and alternatives were fully discussed. Specific risks including but not limited to bleeding, infection, hematoma, seroma, scarring, pain, infection, wound healing problems, and need for further surgery were all discussed. The patient did have an ample opportunity to have questions answered to satisfaction.   DESCRIPTION OF PROCEDURE:  Local anesthesia was administered. The patient's operative site was prepped and draped in a sterile fashion. A time out was performed and all information was confirmed to be correct.  The lesion was excised with a 15 blade and scissor dissection.  Hemostasis was obtained.  Circumferential undermining was performed and the skin was advanced and closed in layers with interrupted buried Monocryl sutures and 5-0 fast gut for the skin.  The lesion excised measured 3 cm, and the total length of closure measured 3 cm.    The patient tolerated the procedure well.  There were no complications.

## 2020-08-19 ENCOUNTER — Other Ambulatory Visit: Payer: Self-pay

## 2020-08-19 ENCOUNTER — Telehealth: Payer: Self-pay | Admitting: Family Medicine

## 2020-08-19 DIAGNOSIS — M810 Age-related osteoporosis without current pathological fracture: Secondary | ICD-10-CM

## 2020-08-19 MED ORDER — IBANDRONATE SODIUM 150 MG PO TABS: 150.0000 mg | ORAL_TABLET | ORAL | 0 refills | Status: DC

## 2020-08-19 NOTE — Telephone Encounter (Signed)
Patient is calling and requesting a refill for ibandronate (BONIVA) 150 MG tablet sent to [facility] on [address], please advise. CB is [phone]

## 2020-08-19 NOTE — Telephone Encounter (Signed)
Rx sent 

## 2020-08-22 LAB — SURGICAL PATHOLOGY

## 2020-08-23 ENCOUNTER — Telehealth: Payer: Self-pay | Admitting: Plastic Surgery

## 2020-08-23 NOTE — Telephone Encounter (Signed)
Patient would like to know when her bandage can be removed from her in office procedure on 08/18/20. Please call to advise.

## 2020-08-25 ENCOUNTER — Encounter: Payer: Self-pay | Admitting: Internal Medicine

## 2020-08-25 ENCOUNTER — Other Ambulatory Visit: Payer: Self-pay

## 2020-08-25 ENCOUNTER — Ambulatory Visit (AMBULATORY_SURGERY_CENTER): Payer: Federal, State, Local not specified - PPO | Admitting: Internal Medicine

## 2020-08-25 VITALS — BP 98/70 | HR 58 | Temp 96.9°F | Resp 14 | Ht 66.0 in | Wt 140.0 lb

## 2020-08-25 DIAGNOSIS — D120 Benign neoplasm of cecum: Secondary | ICD-10-CM | POA: Diagnosis not present

## 2020-08-25 DIAGNOSIS — Z1211 Encounter for screening for malignant neoplasm of colon: Secondary | ICD-10-CM

## 2020-08-25 MED ORDER — SODIUM CHLORIDE 0.9 % IV SOLN: 500.0000 mL | Freq: Once | INTRAVENOUS | Status: DC

## 2020-08-25 NOTE — Telephone Encounter (Signed)
Called and spoke with the patient on (08/24/20) regarding the message below.  Informed the patient that I spoke with Dr. Claudia Desanctis regarding his message, and he stated that she can remove the bandage anytime.  Patient verbalized understanding and agreed.//AB/CMA

## 2020-08-25 NOTE — Patient Instructions (Signed)
Handout given for polyps.  YOU HAD AN ENDOSCOPIC PROCEDURE TODAY AT [facility]:   Refer to the procedure report that was given to you for any specific questions about what was found during the examination.  If the procedure report does not answer your questions, please call your gastroenterologist to clarify.  If you requested that your care partner not be given the details of your procedure findings, then the procedure report has been included in a sealed envelope for you to review at your convenience later.  YOU SHOULD EXPECT: Some feelings of bloating in the abdomen. Passage of more gas than usual.  Walking can help get rid of the air that was put into your GI tract during the procedure and reduce the bloating. If you had a lower endoscopy (such as a colonoscopy or flexible sigmoidoscopy) you may notice spotting of blood in your stool or on the toilet paper. If you underwent a bowel prep for your procedure, you may not have a normal bowel movement for a few days.  Please Note:  You might notice some irritation and congestion in your nose or some drainage.  This is from the oxygen used during your procedure.  There is no need for concern and it should clear up in a day or so.  SYMPTOMS TO REPORT IMMEDIATELY:   Following lower endoscopy (colonoscopy or flexible sigmoidoscopy):  Excessive amounts of blood in the stool  Significant tenderness or worsening of abdominal pains  Swelling of the abdomen that is new, acute  Fever of 100F or higher  For urgent or emergent issues, a gastroenterologist can be reached at any hour by calling [phone]. Do not use MyChart messaging for urgent concerns.    DIET:  We do recommend a small meal at first, but then you may proceed to your regular diet.  Drink plenty of fluids but you should avoid alcoholic beverages for 24 hours.  ACTIVITY:  You should plan to take it easy for the rest of today and you should NOT DRIVE or use heavy  machinery and no work until tomorrow (because of the sedation medicines used during the test).    FOLLOW UP: Our staff will call the number listed on your records 48-72 hours following your procedure to check on you and address any questions or concerns that you may have regarding the information given to you following your procedure. If we do not reach you, we will leave a message.  We will attempt to reach you two times.  During this call, we will ask if you have developed any symptoms of COVID 19. If you develop any symptoms (ie: fever, flu-like symptoms, shortness of breath, cough etc.) before then, please call [phone].  If you test positive for Covid 19 in the 2 weeks post procedure, please call and report this information to [place].    If any biopsies were taken you will be contacted by my chart, phone or by letter within the next 1-3 weeks.  Please call us at [phone] if you have not heard about the biopsies in 3 weeks.    SIGNATURES/CONFIDENTIALITY: You and/or your care partner have signed paperwork which will be entered into your electronic medical record.  These signatures attest to the fact that that the information above on your After Visit Summary has been reviewed and is understood.  Full responsibility of the confidentiality of this discharge information lies with you and/or your care-partner.

## 2020-08-25 NOTE — Progress Notes (Signed)
Called to room to assist during endoscopic procedure.  Patient ID and intended procedure confirmed with present staff. Received instructions for my participation in the procedure from the performing physician.  

## 2020-08-25 NOTE — Progress Notes (Signed)
A and O x3. Report to RN. Tolerated MAC anesthesia well.

## 2020-08-25 NOTE — Progress Notes (Signed)
Vitals by CW 

## 2020-08-25 NOTE — Op Note (Signed)
[facility] Patient Name: Breanna Martinez Procedure Date: 08/25/2020 10:22 AM MRN: [id number] Endoscopist: Docia Chuck. Henrene Pastor , MD Age: 60 Referring MD:  Date of Birth: 10/09/1960 Gender: Female Account #: [id number] Procedure:                Colonoscopy with cold snare polypectomy x 1 Indications:              Screening for colorectal malignant neoplasm. The                            patient reports negative index exam 10 years ago                            (elsewhere) Medicines:                Monitored Anesthesia Care Procedure:                Pre-Anesthesia Assessment:                           - Prior to the procedure, a History and Physical                            was performed, and patient medications and                            allergies were reviewed. The patient's tolerance of                            previous anesthesia was also reviewed. The risks                            and benefits of the procedure and the sedation                            options and risks were discussed with the patient.                            All questions were answered, and informed consent                            was obtained. Prior Anticoagulants: The patient has                            taken no previous anticoagulant or antiplatelet                            agents. ASA Grade Assessment: II - A patient with                            mild systemic disease. After reviewing the risks                            and benefits, the patient was deemed in  satisfactory condition to undergo the procedure.                           After obtaining informed consent, the colonoscope                            was passed under direct vision. Throughout the                            procedure, the patient's blood pressure, pulse, and                            oxygen saturations were monitored continuously. The                            Colonoscope was  introduced through the anus and                            advanced to the the cecum, identified by                            appendiceal orifice and ileocecal valve. The                            ileocecal valve, appendiceal orifice, and rectum                            were photographed. The quality of the bowel                            preparation was excellent. The colonoscopy was                            performed without difficulty. The patient tolerated                            the procedure well. The bowel preparation used was                            SUPREP via split dose instruction. Scope In: 10:41:28 AM Scope Out: 10:54:51 AM Scope Withdrawal Time: 0 hours 9 minutes 46 seconds  Total Procedure Duration: 0 hours 13 minutes 23 seconds  Findings:                 A 2 mm polyp was found in the cecum. The polyp was                            removed with a cold snare. Resection and retrieval                            were complete.                           The exam was otherwise without abnormality on  direct and retroflexion views. Complications:            No immediate complications. Estimated blood loss:                            None. Estimated Blood Loss:     Estimated blood loss: none. Impression:               - One 2 mm polyp in the cecum, removed with a cold                            snare. Resected and retrieved.                           - The examination was otherwise normal on direct                            and retroflexion views. Recommendation:           - Repeat colonoscopy in 7-10 years for surveillance.                           - Patient has a contact number available for                            emergencies. The signs and symptoms of potential                            delayed complications were discussed with the                            patient. Return to normal activities tomorrow.                             Written discharge instructions were provided to the                            patient.                           - Resume previous diet.                           - Continue present medications.                           - Await pathology results. Docia Chuck. Henrene Pastor, MD 08/25/2020 11:00:10 AM This report has been signed electronically.

## 2020-08-29 ENCOUNTER — Telehealth: Payer: Self-pay

## 2020-08-29 NOTE — Telephone Encounter (Signed)
LVM

## 2020-08-31 ENCOUNTER — Encounter: Payer: Self-pay | Admitting: Internal Medicine

## 2020-09-07 ENCOUNTER — Ambulatory Visit: Payer: Federal, State, Local not specified - PPO | Admitting: Plastic Surgery

## 2020-10-14 ENCOUNTER — Other Ambulatory Visit: Payer: Self-pay

## 2020-10-17 ENCOUNTER — Other Ambulatory Visit: Payer: Self-pay

## 2020-10-17 ENCOUNTER — Ambulatory Visit: Payer: Federal, State, Local not specified - PPO | Admitting: Family Medicine

## 2020-10-17 ENCOUNTER — Encounter: Payer: Self-pay | Admitting: Family Medicine

## 2020-10-17 VITALS — BP 112/78 | HR 74 | Temp 98.0°F | Ht 66.0 in | Wt 140.8 lb

## 2020-10-17 DIAGNOSIS — Z23 Encounter for immunization: Secondary | ICD-10-CM | POA: Diagnosis not present

## 2020-10-17 DIAGNOSIS — M5432 Sciatica, left side: Secondary | ICD-10-CM

## 2020-10-17 MED ORDER — PREDNISONE 10 MG PO TABS: ORAL_TABLET | ORAL | 0 refills | Status: DC

## 2020-10-17 NOTE — Progress Notes (Signed)
Subjective:    Patient ID: Breanna Martinez, female    DOB: 06/07/1961, 60 y.o.   MRN: [id number]  Chief Complaint  Patient presents with  . Leg Pain    Back of left thigh, hurts more when walking / ongoing for some time now    HPI Patient was seen today for ongoing concern.  Pt with intermittent, sharp, posterior L thigh pain. Pain noted while at work at the [employer] walking on the concrete floors.  Pt wearing older tennis shoes to work.  Pain at times make it difficult to lay on L side to sleep.  Pain make sitting difficult.  Pt has not tried anything for the pain.  Pt interested in Shingles vaccine.  Does not recall if she had chicken pox as a child.   Pt taking care of her mother and looking for a new house.  Past Medical History:  Diagnosis Date  . Allergy    seasonal allergies  . Osteoporosis     Allergies  Allergen Reactions  . Penicillins Anaphylaxis    ROS General: Denies fever, chills, night sweats, changes in weight, changes in appetite HEENT: Denies headaches, ear pain, changes in vision, rhinorrhea, sore throat CV: Denies CP, palpitations, SOB, orthopnea Pulm: Denies SOB, cough, wheezing GI: Denies abdominal pain, nausea, vomiting, diarrhea, constipation GU: Denies dysuria, hematuria, frequency, vaginal discharge Msk: Denies muscle cramps, joint pains +L posterior thigh pain Neuro: Denies weakness, numbness, tingling Skin: Denies rashes, bruising Psych: Denies depression, anxiety, hallucinations     Objective:    Blood pressure 112/78, pulse 74, temperature 98 F (36.7 C), temperature source Oral, height 5\' 6"  (1.676 m), weight 140 lb 12.8 oz (63.9 kg), SpO2 98 %.  Gen. Pleasant, well-nourished, in no distress, normal affect   HEENT: [place]/AT, face symmetric, conjunctiva clear, no scleral icterus, PERRLA, EOMI, nares patent without drainage Lungs: no accessory muscle use Cardiovascular: RRR, no peripheral edema Musculoskeletal: No TTP of cervical,  thoracic, lumbar, or paraspinal muscles.  No TTP of b/l posterior buttock or thighs.  No deformities, no cyanosis or clubbing, normal tone Neuro:  A&Ox3, CN II-XII intact, normal gait Skin:  Warm, no lesions/ rash   Wt Readings from Last 3 Encounters:  10/17/20 140 lb 12.8 oz (63.9 kg)  08/25/20 140 lb (63.5 kg)  08/11/20 140 lb (63.5 kg)    Lab Results  Component Value Date   WBC 5.7 05/19/2020   HGB 11.8 05/19/2020   HCT 37.9 05/19/2020   PLT 323 05/19/2020   GLUCOSE 113 (H) 05/19/2020   CHOL 179 05/19/2020   TRIG 93 05/19/2020   HDL 63 05/19/2020   LDLCALC 97 05/19/2020   ALT 17 07/08/2017   AST 22 07/08/2017   NA 140 05/19/2020   K 4.1 05/19/2020   CL 105 05/19/2020   CREATININE 0.57 05/19/2020   BUN 18 05/19/2020   CO2 25 05/19/2020   HGBA1C 6.9 (H) 05/19/2020   MICROALBUR 0.8 05/19/2020    Assessment/Plan:  Sciatica of left side -discussed stretching exercises, NSAIDs, heat, and other supportive care -for continued or worsened symptoms consider prednisone and PT. -Wait-and-see prescription for prednisone sent to patient's pharmacy. -continue to monitor -given handout  Need for shingles vaccine  - Plan: Varicella-zoster vaccine IM (Shingrix)  F/u prn  Grier Mitts, MD

## 2020-10-17 NOTE — Patient Instructions (Signed)
Sciatica  Sciatica is pain, numbness, weakness, or tingling along the path of the sciatic nerve. The sciatic nerve starts in the lower back and runs down the back of each leg. The nerve controls the muscles in the lower leg and in the back of the knee. It also provides feeling (sensation) to the back of the thigh, the lower leg, and the sole of the foot. Sciatica is a symptom of another medical condition that pinches or puts pressure on the sciatic nerve. Sciatica most often only affects one side of the body. Sciatica usually goes away on its own or with treatment. In some cases, sciatica may come back (recur). What are the causes? This condition is caused by pressure on the sciatic nerve or pinching of the nerve. This may be the result of:  A disk in between the bones of the spine bulging out too far (herniated disk).  Age-related changes in the spinal disks.  A pain disorder that affects a muscle in the buttock.  Extra bone growth near the sciatic nerve.  A break (fracture) of the pelvis.  Pregnancy.  Tumor. This is rare. What increases the risk? The following factors may make you more likely to develop this condition:  Playing sports that place pressure or stress on the spine.  Having poor strength and flexibility.  A history of back injury or surgery.  Sitting for long periods of time.  Doing activities that involve repetitive bending or lifting.  Obesity. What are the signs or symptoms? Symptoms can vary from mild to very severe, and they may include:  Any of these problems in the lower back, leg, hip, or buttock: ? Mild tingling, numbness, or dull aches. ? Burning sensations. ? Sharp pains.  Numbness in the back of the calf or the sole of the foot.  Leg weakness.  Severe back pain that makes movement difficult. Symptoms may get worse when you cough, sneeze, or laugh, or when you sit or stand for long periods of time. How is this diagnosed? This condition may be  diagnosed based on:  Your symptoms and medical history.  A physical exam.  Blood tests.  Imaging tests, such as: ? X-rays. ? MRI. ? CT scan. How is this treated? In many cases, this condition improves on its own without treatment. However, treatment may include:  Reducing or modifying physical activity.  Exercising and stretching.  Icing and applying heat to the affected area.  Medicines that help to: ? Relieve pain and swelling. ? Relax your muscles.  Injections of medicines that help to relieve pain, irritation, and inflammation around the sciatic nerve (steroids).  Surgery. Follow these instructions at home: Medicines  Take over-the-counter and prescription medicines only as told by your health care provider.  Ask your health care provider if the medicine prescribed to you: ? Requires you to avoid driving or using heavy machinery. ? Can cause constipation. You may need to take these actions to prevent or treat constipation:  Drink enough fluid to keep your urine pale yellow.  Take over-the-counter or prescription medicines.  Eat foods that are high in fiber, such as beans, whole grains, and fresh fruits and vegetables.  Limit foods that are high in fat and processed sugars, such as fried or sweet foods. Managing pain  If directed, put ice on the affected area. ? Put ice in a plastic bag. ? Place a towel between your skin and the bag. ? Leave the ice on for 20 minutes, 2-3 times a day.  If directed, apply heat to the affected area. Use the heat source that your health care provider recommends, such as a moist heat pack or a heating pad. ? Place a towel between your skin and the heat source. ? Leave the heat on for 20-30 minutes. ? Remove the heat if your skin turns bright red. This is especially important if you are unable to feel pain, heat, or cold. You may have a greater risk of getting burned.      Activity  Return to your normal activities as told by  your health care provider. Ask your health care provider what activities are safe for you.  Avoid activities that make your symptoms worse.  Take brief periods of rest throughout the day. ? When you rest for longer periods, mix in some mild activity or stretching between periods of rest. This will help to prevent stiffness and pain. ? Avoid sitting for long periods of time without moving. Get up and move around at least one time each hour.  Exercise and stretch regularly, as told by your health care provider.  Do not lift anything that is heavier than 10 lb (4.5 kg) while you have symptoms of sciatica. When you do not have symptoms, you should still avoid heavy lifting, especially repetitive heavy lifting.  When you lift objects, always use proper lifting technique, which includes: ? Bending your knees. ? Keeping the load close to your body. ? Avoiding twisting.   General instructions  Maintain a healthy weight. Excess weight puts extra stress on your back.  Wear supportive, comfortable shoes. Avoid wearing high heels.  Avoid sleeping on a mattress that is too soft or too hard. A mattress that is firm enough to support your back when you sleep may help to reduce your pain.  Keep all follow-up visits as told by your health care provider. This is important. Contact a health care provider if:  You have pain that: ? Wakes you up when you are sleeping. ? Gets worse when you lie down. ? Is worse than you have experienced in the past. ? Lasts longer than 4 weeks.  You have an unexplained weight loss. Get help right away if:  You are not able to control when you urinate or have bowel movements (incontinence).  You have: ? Weakness in your lower back, pelvis, buttocks, or legs that gets worse. ? Redness or swelling of your back. ? A burning sensation when you urinate. Summary  Sciatica is pain, numbness, weakness, or tingling along the path of the sciatic nerve.  This condition  is caused by pressure on the sciatic nerve or pinching of the nerve.  Sciatica can cause pain, numbness, or tingling in the lower back, legs, hips, and buttocks.  Treatment often includes rest, exercise, medicines, and applying ice or heat. This information is not intended to replace advice given to you by your health care provider. Make sure you discuss any questions you have with your health care provider. Document Revised: 08/11/2018 Document Reviewed: 08/11/2018 Elsevier Patient Education  2021 [place].  Sciatica Rehab Ask your health care provider which exercises are safe for you. Do exercises exactly as told by your health care provider and adjust them as directed. It is normal to feel mild stretching, pulling, tightness, or discomfort as you do these exercises. Stop right away if you feel sudden pain or your pain gets worse. Do not begin these exercises until told by your health care provider. Stretching and range-of-motion exercises These exercises  warm up your muscles and joints and improve the movement and flexibility of your hips and back. These exercises also help to relieve pain, numbness, and tingling. Sciatic nerve glide 1. Sit in a chair with your head facing down toward your chest. Place your hands behind your back. Let your shoulders slump forward. 2. Slowly straighten one of your legs while you tilt your head back as if you are looking toward the ceiling. Only straighten your leg as far as you can without making your symptoms worse. 3. Hold this position for __________ seconds. 4. Slowly return to the starting position. 5. Repeat with your other leg. Repeat __________ times. Complete this exercise __________ times a day. Knee to chest with hip adduction and internal rotation 1. Lie on your back on a firm surface with both legs straight. 2. Bend one of your knees and move it up toward your chest until you feel a gentle stretch in your lower back and buttock. Then, move  your knee toward the shoulder that is on the opposite side from your leg. This is hip adduction and internal rotation. ? Hold your leg in this position by holding on to the front of your knee. 3. Hold this position for __________ seconds. 4. Slowly return to the starting position. 5. Repeat with your other leg. Repeat __________ times. Complete this exercise __________ times a day.   Prone extension on elbows 1. Lie on your abdomen on a firm surface. A bed may be too soft for this exercise. 2. Prop yourself up on your elbows. 3. Use your arms to help lift your chest up until you feel a gentle stretch in your abdomen and your lower back. ? This will place some of your body weight on your elbows. If this is uncomfortable, try stacking pillows under your chest. ? Your hips should stay down, against the surface that you are lying on. Keep your hip and back muscles relaxed. 4. Hold this position for __________ seconds. 5. Slowly relax your upper body and return to the starting position. Repeat __________ times. Complete this exercise __________ times a day.   Strengthening exercises These exercises build strength and endurance in your back. Endurance is the ability to use your muscles for a long time, even after they get tired. Pelvic tilt This exercise strengthens the muscles that lie deep in the abdomen. 1. Lie on your back on a firm surface. Bend your knees and keep your feet flat on the floor. 2. Tense your abdominal muscles. Tip your pelvis up toward the ceiling and flatten your lower back into the floor. ? To help with this exercise, you may place a small towel under your lower back and try to push your back into the towel. 3. Hold this position for __________ seconds. 4. Let your muscles relax completely before you repeat this exercise. Repeat __________ times. Complete this exercise __________ times a day. Alternating arm and leg raises 1. Get on your hands and knees on a firm surface.  If you are on a hard floor, you may want to use padding, such as an exercise mat, to cushion your knees. 2. Line up your arms and legs. Your hands should be directly below your shoulders, and your knees should be directly below your hips. 3. Lift your left leg behind you. At the same time, raise your right arm and straighten it in front of you. ? Do not lift your leg higher than your hip. ? Do not lift your arm higher  than your shoulder. ? Keep your abdominal and back muscles tight. ? Keep your hips facing the ground. ? Do not arch your back. ? Keep your balance carefully, and do not hold your breath. 4. Hold this position for __________ seconds. 5. Slowly return to the starting position. 6. Repeat with your right leg and your left arm. Repeat __________ times. Complete this exercise __________ times a day.   Posture and body mechanics Good posture and healthy body mechanics can help to relieve stress in your body's tissues and joints. Body mechanics refers to the movements and positions of your body while you do your daily activities. Posture is part of body mechanics. Good posture means:  Your spine is in its natural S-curve position (neutral).  Your shoulders are pulled back slightly.  Your head is not tipped forward. Follow these guidelines to improve your posture and body mechanics in your everyday activities. Standing  When standing, keep your spine neutral and your feet about hip width apart. Keep a slight bend in your knees. Your ears, shoulders, and hips should line up.  When you do a task in which you stand in one place for a long time, place one foot up on a stable object that is 2-4 inches (5-10 cm) high, such as a footstool. This helps keep your spine neutral.   Sitting  When sitting, keep your spine neutral and keep your feet flat on the floor. Use a footrest, if necessary, and keep your thighs parallel to the floor. Avoid rounding your shoulders, and avoid tilting your  head forward.  When working at a desk or a computer, keep your desk at a height where your hands are slightly lower than your elbows. Slide your chair under your desk so you are close enough to maintain good posture.  When working at a computer, place your monitor at a height where you are looking straight ahead and you do not have to tilt your head forward or downward to look at the screen.   Resting  When lying down and resting, avoid positions that are most painful for you.  If you have pain with activities such as sitting, bending, stooping, or squatting, lie in a position in which your body does not bend very much. For example, avoid curling up on your side with your arms and knees near your chest (fetal position).  If you have pain with activities such as standing for a long time or reaching with your arms, lie with your spine in a neutral position and bend your knees slightly. Try the following positions: ? Lying on your side with a pillow between your knees. ? Lying on your back with a pillow under your knees. Lifting  When lifting objects, keep your feet at least shoulder width apart and tighten your abdominal muscles.  Bend your knees and hips and keep your spine neutral. It is important to lift using the strength of your legs, not your back. Do not lock your knees straight out.  Always ask for help to lift heavy or awkward objects.   This information is not intended to replace advice given to you by your health care provider. Make sure you discuss any questions you have with your health care provider. Document Revised: 11/14/2018 Document Reviewed: 08/14/2018 Elsevier Patient Education  2021 [place]. Recombinant Zoster (Shingles) Vaccine: What You Need to Know 1. Why get vaccinated? Recombinant zoster (shingles) vaccine can prevent shingles. Shingles (also called herpes zoster, or just zoster) is a painful  skin rash, usually with blisters. In addition to the rash, shingles  can cause fever, headache, chills, or upset stomach. More rarely, shingles can lead to pneumonia, hearing problems, blindness, brain inflammation (encephalitis), or death. The most common complication of shingles is long-term nerve pain called postherpetic neuralgia (PHN). PHN occurs in the areas where the shingles rash was, even after the rash clears up. It can last for months or years after the rash goes away. The pain from PHN can be severe and debilitating. About 10 to 18% of people who get shingles will experience PHN. The risk of PHN increases with age. An older adult with shingles is more likely to develop PHN and have longer lasting and more severe pain than a younger person with shingles. Shingles is caused by the varicella zoster virus, the same virus that causes chickenpox. After you have chickenpox, the virus stays in your body and can cause shingles later in life. Shingles cannot be passed from one person to another, but the virus that causes shingles can spread and cause chickenpox in someone who had never had chickenpox or received chickenpox vaccine. 2. Recombinant shingles vaccine Recombinant shingles vaccine provides strong protection against shingles. By preventing shingles, recombinant shingles vaccine also protects against PHN. Recombinant shingles vaccine is the preferred vaccine for the prevention of shingles. However, a different vaccine, live shingles vaccine, may be used in some circumstances. The recombinant shingles vaccine is recommended for adults 50 years and older without serious immune problems. It is given as a two-dose series. This vaccine is also recommended for people who have already gotten another type of shingles vaccine, the live shingles vaccine. There is no live virus in this vaccine. Shingles vaccine may be given at the same time as other vaccines. 3. Talk with your health care provider Tell your vaccine provider if the person getting the vaccine:  Has had an  allergic reaction after a previous dose of recombinant shingles vaccine, or has any severe, life-threatening allergies.  Is pregnant or breastfeeding.  Is currently experiencing an episode of shingles. In some cases, your health care provider may decide to postpone shingles vaccination to a future visit. People with minor illnesses, such as a cold, may be vaccinated. People who are moderately or severely ill should usually wait until they recover before getting recombinant shingles vaccine. Your health care provider can give you more information. 4. Risks of a vaccine reaction  A sore arm with mild or moderate pain is very common after recombinant shingles vaccine, affecting about 80% of vaccinated people. Redness and swelling can also happen at the site of the injection.  Tiredness, muscle pain, headache, shivering, fever, stomach pain, and nausea happen after vaccination in more than half of people who receive recombinant shingles vaccine. In clinical trials, about 1 out of 6 people who got recombinant zoster vaccine experienced side effects that prevented them from doing regular activities. Symptoms usually went away on their own in 2 to 3 days. You should still get the second dose of recombinant zoster vaccine even if you had one of these reactions after the first dose. People sometimes faint after medical procedures, including vaccination. Tell your provider if you feel dizzy or have vision changes or ringing in the ears. As with any medicine, there is a very remote chance of a vaccine causing a severe allergic reaction, other serious injury, or death. 5. What if there is a serious problem? An allergic reaction could occur after the vaccinated person leaves the clinic. If  you see signs of a severe allergic reaction (hives, swelling of the face and throat, difficulty breathing, a fast heartbeat, dizziness, or weakness), call 9-1-1 and get the person to the nearest hospital. For other signs  that concern you, call your health care provider. Adverse reactions should be reported to the Vaccine Adverse Event Reporting System (VAERS). Your health care provider will usually file this report, or you can do it yourself. Visit the VAERS website at www.vaers.SamedayNews.es or call 608-832-7033. VAERS is only for reporting reactions, and VAERS staff do not give medical advice. 6. How can I learn more?  Ask your health care provider.  Call your local or state health department.  Contact the Centers for Disease Control and Prevention (CDC): ? Call (404)159-0479 (1-800-CDC-INFO) or ? Visit CDC's website at http://hunter.com/ Vaccine Information Statement Recombinant Zoster Vaccine (06/04/2018) This information is not intended to replace advice given to you by your health care provider. Make sure you discuss any questions you have with your health care provider. Document Revised: 03/25/2020 Document Reviewed: 03/25/2020 Elsevier Patient Education  [place].

## 2020-10-31 ENCOUNTER — Telehealth: Payer: Self-pay | Admitting: Family Medicine

## 2020-10-31 DIAGNOSIS — M5432 Sciatica, left side: Secondary | ICD-10-CM

## 2020-10-31 NOTE — Telephone Encounter (Signed)
Patient is calling and stated that she is still experiencing pain in her left leg and has completed the medication and needed to know what to do next, please advise. CB is [phone]

## 2020-11-03 NOTE — Telephone Encounter (Signed)
Patient can go to physical therapy.

## 2020-11-04 NOTE — Telephone Encounter (Signed)
Spoke with patient, advised option for PT, she said she would try it, referral placed.

## 2020-11-22 ENCOUNTER — Telehealth: Payer: Self-pay | Admitting: Family Medicine

## 2020-11-22 ENCOUNTER — Ambulatory Visit (HOSPITAL_BASED_OUTPATIENT_CLINIC_OR_DEPARTMENT_OTHER): Payer: Federal, State, Local not specified - PPO | Attending: Family Medicine | Admitting: Physical Therapy

## 2020-11-22 ENCOUNTER — Other Ambulatory Visit: Payer: Self-pay

## 2020-11-22 DIAGNOSIS — R262 Difficulty in walking, not elsewhere classified: Secondary | ICD-10-CM | POA: Insufficient documentation

## 2020-11-22 DIAGNOSIS — M6281 Muscle weakness (generalized): Secondary | ICD-10-CM | POA: Insufficient documentation

## 2020-11-22 DIAGNOSIS — M25552 Pain in left hip: Secondary | ICD-10-CM | POA: Diagnosis not present

## 2020-11-22 DIAGNOSIS — M5442 Lumbago with sciatica, left side: Secondary | ICD-10-CM | POA: Diagnosis not present

## 2020-11-22 DIAGNOSIS — M25652 Stiffness of left hip, not elsewhere classified: Secondary | ICD-10-CM | POA: Diagnosis not present

## 2020-11-22 NOTE — Therapy (Addendum)
[facility] [address] [place], [place], [address] Phone: [phone]   Fax:  [phone]  Physical Therapy Evaluation and Discharge  Patient Details  Name: MERIEL KELLIHER MRN: [id number] Date of Birth: 24-Jan-1961 Referring Provider (PT): Billie Ruddy, MD  PHYSICAL THERAPY DISCHARGE SUMMARY  Visits from Start of Care: 1  Current functional level related to goals / functional outcomes: Only seen for initial eval   Remaining deficits: Only seen for eval; see deficits below   Education / Equipment: See below   Patient agrees to discharge. Patient goals were not met. Patient is being discharged due to not returning since the last visit.   Encounter Date: 11/22/2020   PT End of Session - 11/22/20 1019     Visit Number 1    Number of Visits 7    Date for PT Re-Evaluation 01/03/21    Authorization Type BCBS    PT Start Time 1020    PT Stop Time 1105    PT Time Calculation (min) 45 min    Activity Tolerance Patient tolerated treatment well    Behavior During Therapy WFL for tasks assessed/performed             Past Medical History:  Diagnosis Date   Allergy    seasonal allergies   Osteoporosis     Past Surgical History:  Procedure Laterality Date   rotator cuff surgery     TOE SURGERY      There were no vitals filed for this visit.    Subjective Assessment - 11/22/20 1022     Subjective Pt reports pain sharp pain that comes from her low back and down into her feet (into pinky toe). Pt works in [employer] and walks too much. Pt's pain comes and goes but now feels the pain when she sleeps. Pt believes it started around February. Pt reports having to push heavy objects at work at times. Pt has been doing exercises at home.    Pertinent History Osteoporosis    Limitations Walking    How long can you sit comfortably? No issues as long as chair is comfortable.    How long can you stand  comfortably? Intermittent    How long can you walk comfortably? Depends. Can occur intermittently has stopped increased walking in general. Currently can walk from [place] to [place].    Patient Stated Goals Get rid of the pain, return to PLOF    Currently in Pain? Yes    Pain Score 6     Pain Location Back    Pain Orientation Left    Pain Descriptors / Indicators Sharp    Pain Type Acute pain    Pain Onset More than a month ago    Pain Frequency Intermittent    Aggravating Factors  Walking, standing    Pain Relieving Factors Ice    Effect of Pain on Daily Activities Work, walking, standing                [facility] PT Assessment - 11/22/20 0001       Assessment   Medical Diagnosis M54.32 (ICD-10-CM) - Sciatica of left side    Referring Provider (PT) Billie Ruddy, MD    Prior Therapy R [facility] last year      Precautions   Precautions None      Restrictions   Weight Bearing Restrictions No      Balance Screen   Has the patient fallen in the past  6 months No      Home Social worker Private residence    Living Arrangements Alone    Available Help at Discharge Family    Type of [place] Access Level entry    Home Layout One level    [place] None      Prior Function   Level of Independence Independent    Vocation Full time employment    Public librarian    Leisure Walk, gym      Observation/Other Assessments   Focus on Therapeutic Outcomes (FOTO)  n/a      Functional Tests   Functional tests Other;Single leg stance      Single Leg Stance   Comments L LE: 1 min; R LE: 1 min      Other:   Other/ Comments Plank: 30 sec      Posture/Postural Control   Posture/Postural Control Postural limitations    Postural Limitations Right pelvic obliquity;Weight shift right    Posture Comments Tends to stand with L foot staggered in front      ROM / Strength   AROM / PROM / Strength AROM;Strength      AROM   AROM  Assessment Site Lumbar;Hip    Right/Left Hip Left    Left Hip Flexion --   Painful   Left Hip External Rotation  20    Left Hip ABduction 25    Lumbar Flexion WFL    Lumbar Extension 50%    Lumbar - Right Side Bend 75%    Lumbar - Left Side Bend 50%    Lumbar - Right Rotation WFL   Feels good   Lumbar - Left Rotation WFL   Feels good     Strength   Strength Assessment Site Hip;Knee    Right/Left Hip Right;Left    Right Hip Flexion 5/5    Right Hip Extension 3+/5    Right Hip External Rotation  4/5    Right Hip Internal Rotation 4/5    Right Hip ABduction 4+/5    Left Hip Flexion 3+/5    Left Hip Extension 3+/5    Left Hip External Rotation 4/5    Left Hip Internal Rotation 4/5    Left Hip ABduction 3+/5    Right/Left Knee Right;Left    Right Knee Flexion 5/5    Right Knee Extension 5/5    Left Knee Flexion 4/5    Left Knee Extension 4+/5      Palpation   SI assessment  Compression feels good    Palpation comment No issues      Special Tests    Special Tests Lumbar;Sacrolliac Tests;Leg LengthTest    Lumbar Tests FABER test;Slump Test;Prone Knee Bend Test;Straight Leg Raise    Sacroiliac Tests  Gaenslen's Test    Leg length test  Apparent      FABER test   findings Positive    Side LEft    Comment Posterior hip      Slump test   Findings Negative      Prone Knee Bend Test   Findings Positive    Side Left      Straight Leg Raise   Findings Negative      Gaenslen's test   Findings Positive    Side  Left      Apparent   Comments L shorter in long sit  Objective measurements completed on examination: See above findings.       [facility] Adult PT Treatment/Exercise - 11/22/20 0001       Exercises   Exercises Knee/Hip      Knee/Hip Exercises: Stretches   Other Knee/Hip Stretches 90/90 SI joint self-correction (L hip ext iso, R hip flex iso)      Knee/Hip Exercises: Supine   Other Supine Knee/Hip Exercises Clamshell  iso 3x10 sec    Other Supine Knee/Hip Exercises Hip adduction iso 3x10 sec                    PT Education - 11/22/20 1111     Education Details Exam findings, POC    Person(s) Educated Patient    Methods Explanation;Demonstration;Handout    Comprehension Verbalized understanding;Returned demonstration;Need further instruction                 PT Long Term Goals - 11/22/20 1119       PT LONG TERM GOAL #1   Title Pt will be independent with her final HEP    Time 6    Period Weeks    Status New    [place] Date 01/03/21      PT LONG TERM GOAL #2   Title Pt will be able to perform 1 minute plank to demo improved core stabilization    Baseline 30 sec    Time 6    Period Weeks    Status New    Target Date 01/03/21      PT LONG TERM GOAL #3   Title Pt will have full L hip and lumbar ROM without pain for home and work tasks    Baseline Limited abduction, pain with flexion    Time 6    Period Weeks    Status New    Target Date 01/03/21      PT LONG TERM GOAL #4   Title Pt will be able to perform squat with 25# to demo improved functional hip strength    Baseline L hip 3+/5 flex, abd, ext    Time 6    Period Weeks    Status New    Target Date 01/03/21                    Plan - 11/22/20 1112     Clinical Impression Statement Ms. Mangus is a 60 y/o F presenting to OPPT due to complain of L hip/low back pain. Pt's pain is intermittent and can radiate down her posterior leg. At times it can localize to the front of her hip. On assessment, pt found to have L anterior inominate rotation with weak core and hip strength affecting her sitting, standing, and walking for home and work tasks. Pt is hyperflexible generally but demos decreased L hip and lumbar ROM due to pain and will benefit from increased stabilization to improve her SI pain. Provided pt with initial treatment to correct her pelvic rotation.    Personal Factors and Comorbidities  Age;Sex;Fitness;Past/Current Experience;Time since onset of injury/illness/exacerbation;Profession    Examination-Activity Limitations Sit;Sleep;Squat;Stairs;Stand;Carry;Lift;Locomotion Level    Examination-Participation Restrictions Community Activity;Occupation;Driving;Shop    Stability/Clinical Decision Making Stable/Uncomplicated    Clinical Decision Making Low    Rehab Potential Excellent    PT Frequency 1x / week    PT Duration 6 weeks    PT Treatment/Interventions ADLs/Self Care Home Management;Cryotherapy;Electrical Stimulation;Moist Heat;Iontophoresis 78m/ml Dexamethasone;Gait training;Stair training;Functional mobility training;Therapeutic activities;Therapeutic exercise;Neuromuscular re-education;Patient/family education;Manual techniques;Passive range of motion;Dry  needling;Taping    PT Next Visit Plan Assess response to HEP. Initiate core and hip strengthening. Manual therapy as needed. MET for SI joint correction as needed.    PT Home Exercise Plan Access Code: [email or web address]    [email or web address] and Agree with Plan of Care Patient             Patient will benefit from skilled therapeutic intervention in order to improve the following deficits and impairments:  Abnormal gait,Decreased activity tolerance,Decreased strength,Pain,Increased fascial restricitons,Decreased mobility,Difficulty walking,Decreased range of motion,Postural dysfunction,Improper body mechanics  Visit Diagnosis: Pain in left hip  Stiffness of left hip, not elsewhere classified  Left-sided low back pain with left-sided sciatica, unspecified chronicity  Muscle weakness (generalized)  Difficulty in walking, not elsewhere classified     Problem List Patient Active Problem List   Diagnosis Date Noted   Atrial tachycardia ([place]) 01/27/2018   Premature supraventricular beats 12/25/2017   Right shoulder injury, subsequent encounter 08/28/2017   Osteoporosis 02/19/2016   Prediabetes 10/15/2015    Jahyra Sukup April Beatrix Shipper Daizha Anand PT, DPT 11/22/2020, 11:30 AM  [facility] [place], [place], [address] Phone: [phone]   Fax:  [phone]  Name: INGER WIEST MRN: [id number] Date of Birth: March 27, 1961

## 2020-11-22 NOTE — Telephone Encounter (Signed)
Patient is calling and is requesting a call back regarding getting a letter written for a shorter distance to walk from the parking lot to the office, please advise. CB is [phone]

## 2020-11-22 NOTE — Patient Instructions (Signed)
Access Code: [email or web address] URL: [email or web address] Date: 11/22/2020 Prepared by: Breanna Martinez Breanna Martinez  Exercises 90/90 SI Joint Self-Correction - 1 x daily - 7 x weekly - 3 sets - 30 sec hold Supine Hip Adduction Isometric with Ball - 1 x daily - 7 x weekly - 3 sets - 10 sec hold Hooklying Isometric Clamshell - 1 x daily - 7 x weekly - 3 sets - 10 sec hold

## 2020-11-25 ENCOUNTER — Other Ambulatory Visit: Payer: Self-pay

## 2020-11-25 ENCOUNTER — Ambulatory Visit (INDEPENDENT_AMBULATORY_CARE_PROVIDER_SITE_OTHER): Payer: Federal, State, Local not specified - PPO

## 2020-11-25 ENCOUNTER — Ambulatory Visit: Payer: Federal, State, Local not specified - PPO | Admitting: Family Medicine

## 2020-11-25 ENCOUNTER — Encounter: Payer: Self-pay | Admitting: Family Medicine

## 2020-11-25 VITALS — BP 108/72 | HR 75 | Temp 98.2°F | Wt 140.8 lb

## 2020-11-25 DIAGNOSIS — M25552 Pain in left hip: Secondary | ICD-10-CM

## 2020-11-25 DIAGNOSIS — M5432 Sciatica, left side: Secondary | ICD-10-CM

## 2020-11-25 DIAGNOSIS — R200 Anesthesia of skin: Secondary | ICD-10-CM | POA: Diagnosis not present

## 2020-11-25 DIAGNOSIS — R262 Difficulty in walking, not elsewhere classified: Secondary | ICD-10-CM

## 2020-11-25 NOTE — Progress Notes (Signed)
Subjective:    Patient ID: Breanna Martinez, female    DOB: 04-27-1961, 60 y.o.   MRN: [id number]  No chief complaint on file.   HPI Patient was seen today for follow-up on back and left hip pain.  Patient endorses improvement in back pain since starting PT.  Patient now with constant left hip pain.  Having to adjust walk due to discomfort.  Also notes numbness in left foot and fourth and fifth digits.  Patient had foot surgery in 2010?  States feels numbness all the time and more when laying in bed/sleeping.  Patient requesting a note for work so that she does not have to walk as far to access the building.  Patient inquires about second single shingles shot.  Had first dose of zoster vaccine 10/17/2020.  Past Medical History:  Diagnosis Date  . Allergy    seasonal allergies  . Osteoporosis     Allergies  Allergen Reactions  . Penicillins Anaphylaxis    ROS General: Denies fever, chills, night sweats, changes in weight, changes in appetite HEENT: Denies headaches, ear pain, changes in vision, rhinorrhea, sore throat CV: Denies CP, palpitations, SOB, orthopnea Pulm: Denies SOB, cough, wheezing GI: Denies abdominal pain, nausea, vomiting, diarrhea, constipation GU: Denies dysuria, hematuria, frequency, vaginal discharge Msk: Denies muscle cramps, joint pains   + left hip/groin pain Neuro: Denies weakness, tingling  + numbness in left foot Skin: Denies rashes, bruising Psych: Denies depression, anxiety, hallucinations     Objective:    Blood pressure 108/72, pulse 75, temperature 98.2 F (36.8 C), temperature source Oral, weight 140 lb 12.8 oz (63.9 kg), SpO2 (!) 75 %.  Gen. Pleasant, well-nourished, in no distress, normal affect  HEENT: [place]/AT, face symmetric, conjunctiva clear, no scleral icterus, PERRLA, EOMI, nares patent without drainage Lungs: no accessory muscle use, CTAB, no wheezes or rales Cardiovascular: RRR, no m/r/g, no peripheral edema Musculoskeletal: Positive  leg roll, FADIR and FABER of L LE.  Discomfort with AB duction of LLE.  RLE normal.  No TTP of cervical, thoracic, lumbar, paraspinal muscles, or sciatic nerves b/l.  No deformities, no cyanosis or clubbing, normal tone Neuro:  A&Ox3, CN II-XII intact, ambulates favoring LLE. Skin:  Warm, no lesions/ rash.  Scarring from healed surgical incision on dorsal surface of left foot near third and fourth digits   Wt Readings from Last 3 Encounters:  11/25/20 140 lb 12.8 oz (63.9 kg)  10/17/20 140 lb 12.8 oz (63.9 kg)  08/25/20 140 lb (63.5 kg)    Lab Results  Component Value Date   WBC 5.7 05/19/2020   HGB 11.8 05/19/2020   HCT 37.9 05/19/2020   PLT 323 05/19/2020   GLUCOSE 113 (H) 05/19/2020   CHOL 179 05/19/2020   TRIG 93 05/19/2020   HDL 63 05/19/2020   LDLCALC 97 05/19/2020   ALT 17 07/08/2017   AST 22 07/08/2017   NA 140 05/19/2020   K 4.1 05/19/2020   CL 105 05/19/2020   CREATININE 0.57 05/19/2020   BUN 18 05/19/2020   CO2 25 05/19/2020   HGBA1C 6.9 (H) 05/19/2020   MICROALBUR 0.8 05/19/2020    Assessment/Plan:  Left hip pain -Discussed possible causes including arthritis, muscle strain from adjusting walk -We will obtain hip x-ray -Discussed OTC Voltaren gel as needed and other supportive care including supportive shoes -Continue PT -Consider Ortho follow-up as needed - Plan: DG Hip Unilat W OR W/O Pelvis 2-3 Views Left  Numbness -Discussed possible causes including vitamin deficiency -Consider  obtaining B12 and folate -Also discussed rechecking hemoglobin A1c which was 6.9% on 05/19/2020 -Lifestyle modifications encouraged.  Sciatica of left side -Resolved  Difficulty in walking -Continue PT -Given note for work for access to the building closer to the parking lot  Patient advised of timing of shingles vaccines.  F/u as needed  Grier Mitts, MD

## 2020-11-25 NOTE — Patient Instructions (Addendum)
You can use Voltaren gel for your left hip.  It can be found over-the-counter at your local drugstore, [employer], [employer], etc.   Hip Pain The hip is the joint between the upper legs and the lower pelvis. The bones, cartilage, tendons, and muscles of your hip joint support your body and allow you to move around. Hip pain can range from a minor ache to severe pain in one or both of your hips. The pain may be felt on the inside of the hip joint near the groin, or on the outside near the buttocks and upper thigh. You may also have swelling or stiffness in your hip area. Follow these instructions at home: Managing pain, stiffness, and swelling  If directed, put ice on the painful area. To do this: ? Put ice in a plastic bag. ? Place a towel between your skin and the bag. ? Leave the ice on for 20 minutes, 2-3 times a day.  If directed, apply heat to the affected area as often as told by your health care provider. Use the heat source that your health care provider recommends, such as a moist heat pack or a heating pad. ? Place a towel between your skin and the heat source. ? Leave the heat on for 20-30 minutes. ? Remove the heat if your skin turns bright red. This is especially important if you are unable to feel pain, heat, or cold. You may have a greater risk of getting burned.      Activity  Do exercises as told by your health care provider.  Avoid activities that cause pain. General instructions  Take over-the-counter and prescription medicines only as told by your health care provider.  Keep a journal of your symptoms. Write down: ? How often you have hip pain. ? The location of your pain. ? What the pain feels like. ? What makes the pain worse.  Sleep with a pillow between your legs on your most comfortable side.  Keep all follow-up visits as told by your health care provider. This is important.   Contact a health care provider if:  You cannot put weight on your leg.  Your  pain or swelling continues or gets worse after one week.  It gets harder to walk.  You have a fever. Get help right away if:  You fall.  You have a sudden increase in pain and swelling in your hip.  Your hip is red or swollen or very tender to touch. Summary  Hip pain can range from a minor ache to severe pain in one or both of your hips.  The pain may be felt on the inside of the hip joint near the groin, or on the outside near the buttocks and upper thigh.  Avoid activities that cause pain.  Write down how often you have hip pain, the location of the pain, what makes it worse, and what it feels like. This information is not intended to replace advice given to you by your health care provider. Make sure you discuss any questions you have with your health care provider. Document Revised: 12/08/2018 Document Reviewed: 12/08/2018 Elsevier Patient Education  2021 [employer]'s neurology in clinical practice (8th ed., pp. 7341- 1929). Elsevier."> [employer] medicine (26th ed., pp. 2489- 2501). Elsevier.">  Peripheral Neuropathy Peripheral neuropathy is a type of nerve damage. It affects nerves that carry signals between the spinal cord and the arms, legs, and the rest of the body (peripheral nerves). It does  not affect nerves in the spinal cord or brain. In peripheral neuropathy, one nerve or a group of nerves may be damaged. Peripheral neuropathy is a broad category that includes many specific nerve disorders, like diabetic neuropathy, hereditary neuropathy, and carpal tunnel syndrome. What are the causes? This condition may be caused by:  Diabetes. This is the most common cause of peripheral neuropathy.  Nerve injury.  Pressure or stress on a nerve that lasts a long time.  Lack (deficiency) of B vitamins. This can result from alcoholism, poor diet, or a restricted diet.  Infections.  Autoimmune diseases, such as rheumatoid arthritis and systemic lupus  erythematosus.  Nerve diseases that are passed from parent to child (inherited).  Some medicines, such as cancer medicines (chemotherapy).  Poisonous (toxic) substances, such as lead and mercury.  Too little blood flowing to the legs.  Kidney disease.  Thyroid disease. In some cases, the cause of this condition is not known. What are the signs or symptoms? Symptoms of this condition depend on which of your nerves is damaged. Common symptoms include:  Loss of feeling (numbness) in the feet, hands, or both.  Tingling in the feet, hands, or both.  Burning pain.  Very sensitive skin.  Weakness.  Not being able to move a part of the body (paralysis).  Muscle twitching.  Clumsiness or poor coordination.  Loss of balance.  Not being able to control your bladder.  Feeling dizzy.  Sexual problems. How is this diagnosed? Diagnosing and finding the cause of peripheral neuropathy can be difficult. Your health care provider will take your medical history and do a physical exam. A neurological exam will also be done. This involves checking things that are affected by your brain, spinal cord, and nerves (nervous system). For example, your health care provider will check your reflexes, how you move, and what you can feel. You may have other tests, such as:  Blood tests.  Electromyogram (EMG) and nerve conduction tests. These tests check nerve function and how well the nerves are controlling the muscles.  Imaging tests, such as CT scans or MRI to rule out other causes of your symptoms.  Removing a small piece of nerve to be examined in a lab (nerve biopsy).  Removing and examining a small amount of the fluid that surrounds the brain and spinal cord (lumbar puncture). How is this treated? Treatment for this condition may involve:  Treating the underlying cause of the neuropathy, such as diabetes, kidney disease, or vitamin deficiencies.  Stopping medicines that can cause  neuropathy, such as chemotherapy.  Medicine to help relieve pain. Medicines may include: ? Prescription or over-the-counter pain medicine. ? Antiseizure medicine. ? Antidepressants. ? Pain-relieving patches that are applied to painful areas of skin.  Surgery to relieve pressure on a nerve or to destroy a nerve that is causing pain.  Physical therapy to help improve movement and balance.  Devices to help you move around (assistive devices). Follow these instructions at home: Medicines  Take over-the-counter and prescription medicines only as told by your health care provider. Do not take any other medicines without first asking your health care provider.  Do not drive or use heavy machinery while taking prescription pain medicine. Lifestyle  Do not use any products that contain nicotine or tobacco, such as cigarettes and e-cigarettes. Smoking keeps blood from reaching damaged nerves. If you need help quitting, ask your health care provider.  Avoid or limit alcohol. Too much alcohol can cause a vitamin B deficiency, and  vitamin B is needed for healthy nerves.  Eat a healthy diet. This includes: ? Eating foods that are high in fiber, such as fresh fruits and vegetables, whole grains, and beans. ? Limiting foods that are high in fat and processed sugars, such as fried or sweet foods.   General instructions  If you have diabetes, work closely with your health care provider to keep your blood sugar under control.  If you have numbness in your feet: ? Check every day for signs of injury or infection. Watch for redness, warmth, and swelling. ? Wear padded socks and comfortable shoes. These help protect your feet.  Develop a good support system. Living with peripheral neuropathy can be stressful. Consider talking with a mental health specialist or joining a support group.  Use assistive devices and attend physical therapy as told by your health care provider. This may include using a  walker or a cane.  Keep all follow-up visits as told by your health care provider. This is important.   Contact a health care provider if:  You have new signs or symptoms of peripheral neuropathy.  You are struggling emotionally from dealing with peripheral neuropathy.  Your pain is not well-controlled. Get help right away if:  You have an injury or infection that is not healing normally.  You develop new weakness in an arm or leg.  You have fallen or do so frequently. Summary  Peripheral neuropathy is when the nerves in the arms, or legs are damaged, resulting in numbness, weakness, or pain.  There are many causes of peripheral neuropathy, including diabetes, pinched nerves, vitamin deficiencies, autoimmune disease, and hereditary conditions.  Diagnosing and finding the cause of peripheral neuropathy can be difficult. Your health care provider will take your medical history, do a physical exam, and do tests, including blood tests and nerve function tests.  Treatment involves treating the underlying cause of the neuropathy and taking medicines to help control pain. Physical therapy and assistive devices may also help. This information is not intended to replace advice given to you by your health care provider. Make sure you discuss any questions you have with your health care provider. Document Revised: 05/03/2020 Document Reviewed: 05/03/2020 Elsevier Patient Education  2021 [employer].

## 2020-11-25 NOTE — Telephone Encounter (Signed)
Addressed during office visit on 11/25/2020.

## 2020-11-28 DIAGNOSIS — H18831 Recurrent erosion of cornea, right eye: Secondary | ICD-10-CM | POA: Diagnosis not present

## 2020-11-29 ENCOUNTER — Other Ambulatory Visit: Payer: Self-pay

## 2020-11-29 DIAGNOSIS — M25552 Pain in left hip: Secondary | ICD-10-CM

## 2020-11-29 DIAGNOSIS — R262 Difficulty in walking, not elsewhere classified: Secondary | ICD-10-CM

## 2020-11-29 DIAGNOSIS — M25551 Pain in right hip: Secondary | ICD-10-CM

## 2020-11-29 NOTE — Progress Notes (Signed)
Spoke with patient, referral placed for Ortho.

## 2020-12-01 ENCOUNTER — Ambulatory Visit: Payer: Self-pay

## 2020-12-01 ENCOUNTER — Ambulatory Visit: Payer: Federal, State, Local not specified - PPO | Admitting: Orthopaedic Surgery

## 2020-12-01 ENCOUNTER — Ambulatory Visit (HOSPITAL_BASED_OUTPATIENT_CLINIC_OR_DEPARTMENT_OTHER): Payer: Federal, State, Local not specified - PPO | Admitting: Physical Therapy

## 2020-12-01 ENCOUNTER — Encounter: Payer: Self-pay | Admitting: Orthopaedic Surgery

## 2020-12-01 DIAGNOSIS — M1612 Unilateral primary osteoarthritis, left hip: Secondary | ICD-10-CM | POA: Diagnosis not present

## 2020-12-01 NOTE — Progress Notes (Signed)
Subjective: Patient is here for ultrasound-guided intra-articular left hip injection.   Pain from DJD.  Objective:  Pain with IR.  Procedure: Ultrasound guided injection is preferred based studies that show increased duration, increased effect, greater accuracy, decreased procedural pain, increased response rate, and decreased cost with ultrasound guided versus blind injection.   Verbal informed consent obtained.  Time-out conducted.  Noted no overlying erythema, induration, or other signs of local infection. Ultrasound-guided left hip injection: After sterile prep with Betadine, injected 4 cc 0.25% bupivacaine without epinephrine and 6 mg betamethasone using a 22-gauge spinal needle, passing the needle through the iliofemoral ligament into the femoral head/neck junction.  Injectate seen filling joint capsule.  Good immediate relief.    

## 2020-12-01 NOTE — Progress Notes (Signed)
Office Visit Note   Patient: Breanna Martinez           Date of Birth: 1961/05/21           MRN: [id number] Visit Date: 12/01/2020              Requested by: Billie Ruddy, MD [place],  [place] [address] PCP: Billie Ruddy, MD   Assessment & Plan: Visit Diagnoses:  1. Unilateral primary osteoarthritis, left hip     Plan: Impression is left hip osteoarthritis.  We have discussed referral to Dr. Junius Roads for ultrasound-guided cortisone injection for which she would like to proceed.  She will follow-up with [place] as needed.  Follow-Up Instructions: Return if symptoms worsen or fail to improve.   Orders:  No orders of the defined types were placed in this encounter.  No orders of the defined types were placed in this encounter.     Procedures: No procedures performed   Clinical Data: No additional findings.   Subjective: Chief Complaint  Patient presents with  . Left Hip - Pain    HPI patient is a very pleasant 60 year old female who comes in today with left hip pain for the past 2 months.  This was initially intermittent but has become more constant in nature.  Pain is worse at the gym when she is leisurely walking as well as when she is working at the [employer] where she walks quite a bit.  The majority of her pain is to the left groin but does note occasional pain to the left buttocks.  She recently had what felt like sciatica and was prescribed a steroid by her PCP which did help back pain.  She denies any weakness to the left lower extremity.  She does note numbness to her small toe on the left.  She is in physical therapy for her back which is helping.  Review of Systems as detailed in HPI.  All others reviewed and are negative.   Objective: Vital Signs: There were no vitals taken for this visit.  Physical Exam well-developed well-nourished female no acute distress.  Alert and orient x3.  Ortho Exam left hip exam there is a positive  logroll and FADIR.  Negative straight leg raise.  She has increased pain with lumbar extension.  No pain with rotation or flexion.  No focal weakness.  She is neurovascular intact distally.  Specialty Comments:  No specialty comments available.  Imaging: X-rays reviewed by me in [place] reveal moderate degenerative changes to the left hip.   PMFS History: Patient Active Problem List   Diagnosis Date Noted  . Atrial tachycardia ([facility]) 01/27/2018  . Premature supraventricular beats 12/25/2017  . Right shoulder injury, subsequent encounter 08/28/2017  . Osteoporosis 02/19/2016  . Prediabetes 10/15/2015   Past Medical History:  Diagnosis Date  . Allergy    seasonal allergies  . Osteoporosis     Family History  Problem Relation Age of Onset  . Diabetes Mother   . Glaucoma Mother   . Prostate cancer Father   . Colon cancer Neg Hx   . Colon polyps Neg Hx   . Esophageal cancer Neg Hx   . Rectal cancer Neg Hx   . Stomach cancer Neg Hx     Past Surgical History:  Procedure Laterality Date  . rotator cuff surgery    . TOE SURGERY     Social History   Occupational History  . Not on file  Tobacco Use  . Smoking status: Never Smoker  . Smokeless tobacco: Never Used  Vaping Use  . Vaping Use: Never used  Substance and Sexual Activity  . Alcohol use: Yes    Comment: socially   . Drug use: No  . Sexual activity: Not on file

## 2020-12-07 ENCOUNTER — Ambulatory Visit (HOSPITAL_BASED_OUTPATIENT_CLINIC_OR_DEPARTMENT_OTHER): Payer: Federal, State, Local not specified - PPO | Admitting: Physical Therapy

## 2020-12-12 ENCOUNTER — Encounter (HOSPITAL_BASED_OUTPATIENT_CLINIC_OR_DEPARTMENT_OTHER): Payer: Self-pay | Admitting: Physical Therapy

## 2020-12-21 ENCOUNTER — Encounter (HOSPITAL_BASED_OUTPATIENT_CLINIC_OR_DEPARTMENT_OTHER): Payer: Self-pay | Admitting: Physical Therapy

## 2021-01-13 DIAGNOSIS — K080 Exfoliation of teeth due to systemic causes: Secondary | ICD-10-CM | POA: Diagnosis not present

## 2021-01-25 DIAGNOSIS — H16201 Unspecified keratoconjunctivitis, right eye: Secondary | ICD-10-CM | POA: Diagnosis not present

## 2021-02-22 ENCOUNTER — Telehealth: Payer: Self-pay | Admitting: Family Medicine

## 2021-02-22 NOTE — Telephone Encounter (Signed)
Pt is calling in needing a referral to a New Primary Care office (establish care) [place]  [phone] (P) due to she has moved to [facility] and they are requiring a referral from her current PCP.

## 2021-02-27 NOTE — Telephone Encounter (Signed)
Pt does not need a referral, just has to call office and set up new pt appt.

## 2021-03-01 ENCOUNTER — Other Ambulatory Visit: Payer: Self-pay

## 2021-03-02 ENCOUNTER — Ambulatory Visit: Payer: Federal, State, Local not specified - PPO | Admitting: Family Medicine

## 2021-03-02 ENCOUNTER — Encounter: Payer: Self-pay | Admitting: Family Medicine

## 2021-03-02 VITALS — BP 112/78 | HR 77 | Temp 98.9°F | Ht 66.0 in | Wt 142.8 lb

## 2021-03-02 DIAGNOSIS — L509 Urticaria, unspecified: Secondary | ICD-10-CM

## 2021-03-02 MED ORDER — PREDNISONE 10 MG PO TABS: ORAL_TABLET | ORAL | 0 refills | Status: DC

## 2021-03-02 NOTE — Progress Notes (Signed)
   Subjective:    Patient ID: Breanna Martinez, female    DOB: [birth year], 60 y.o.   MRN: [id number]  HPI Here for 10 days of intensely itchy spots that come up all over her body. These start as red bumps and then they turn into flat bruises. She has never had this before. She is not on any new medications, no new soaps or detergents, etc. The only thing different for her is she moved in with her sister about 2 months ago. There are no pets in the home.    Review of Systems  Constitutional: Negative.   Respiratory: Negative.    Cardiovascular: Negative.   Skin:  Positive for rash.      Objective:   Physical Exam Constitutional:      Appearance: Normal appearance.  Cardiovascular:     Rate and Rhythm: Normal rate and regular rhythm.     Pulses: Normal pulses.     Heart sounds: Normal heart sounds.  Pulmonary:     Effort: Pulmonary effort is normal.     Breath sounds: Normal breath sounds.  Skin:    Comments: Multiple ecchymoses over the arms, legs, and trunk with a few red nodules   Neurological:     Mental Status: She is alert.          Assessment & Plan:  Hives, treat with a 16 day taper of Prednisone starting with 40 mg a day. Add Zyrtec BID. Recheck as needed.  Alysia Penna, MD

## 2021-03-14 ENCOUNTER — Encounter: Payer: Self-pay | Admitting: Family Medicine

## 2021-03-14 ENCOUNTER — Telehealth (INDEPENDENT_AMBULATORY_CARE_PROVIDER_SITE_OTHER): Payer: Federal, State, Local not specified - PPO | Admitting: Family Medicine

## 2021-03-14 DIAGNOSIS — R059 Cough, unspecified: Secondary | ICD-10-CM | POA: Diagnosis not present

## 2021-03-14 DIAGNOSIS — R490 Dysphonia: Secondary | ICD-10-CM | POA: Diagnosis not present

## 2021-03-14 MED ORDER — BENZONATATE 100 MG PO CAPS: 100.0000 mg | ORAL_CAPSULE | Freq: Three times a day (TID) | ORAL | 0 refills | Status: DC | PRN

## 2021-03-14 NOTE — Patient Instructions (Addendum)
---------------------------------------------------------------------------------------------------------------------------    WORK SLIP:  Patient Breanna Martinez,  09/20/1960, was seen for a medical visit today, 03/14/21 . Please excuse from work for a COVID like illness. If the covid test is positive, we advise 10 days minimum from the onset of symptoms (03/12/21) PLUS 1 day of no fever and improved symptoms. Will defer to employer for a sooner return to work if symptoms have resolved, she has a negative test, or it is greater than 5 days since the positive test and the patient can wear a high-quality, tight fitting mask such as N95 or KN95 at all times for an additional 5 days. Would also suggest COVID19 antigen testing is negative prior to return.  Sincerely: E-signature: Dr. Colin Benton, DO [place] Ph: [phone]   ------------------------------------------------------------------------------------------------------------------------------   HOME CARE TIPS:  -[place] testing information: https://www.[facility].org/ OR 716-749-2466 Most pharmacies also offer testing and home test kits. If the Covid19 test is positive, please make a prompt follow up visit with your primary care office or with [facility] to discuss treatment options. Treatments for Covid19 are best given early in the course of the illness.   -I sent the medication(s) we discussed to your pharmacy: Meds ordered this encounter  Medications   benzonatate (TESSALON PERLES) 100 MG capsule    Sig: Take 1 capsule (100 mg total) by mouth 3 (three) times daily as needed.    Dispense:  20 capsule    Refill:  0    -can use tylenol or aleve if needed for fevers, aches and pains per instructions  -can use nasal saline a few times per day if you have nasal congestion; sometimes  a short course of Afrin nasal spray for 3 days can help with symptoms as  well  -stay hydrated, drink plenty of fluids and eat small healthy meals - avoid dairy  -can take 1000 IU (59mg) Vit D3 and 100-500 mg of Vit C daily per instructions  -If the Covid test is positive, [facility] for more information on home care, transmission and treatment for COVID19  -follow up with your doctor in 2-3 days unless improving and feeling better  -stay home while sick, except to seek medical care. If you have COVID19, ideally it would be best to stay home for a full 10 days since the onset of symptoms PLUS one day of no fever and feeling better. Wear a good mask that fits snugly (such as N95 or KN95) if around others to reduce the risk of transmission.  It was nice to meet you today, and I really hope you are feeling better soon. I help  out with telemedicine visits on Tuesdays and Thursdays and am available for visits on those days. If you have any concerns or questions following this visit please schedule a follow up visit with your Primary Care doctor or seek care at a local urgent care clinic to avoid delays in care.    Seek in person care or schedule a follow up video visit promptly if your symptoms worsen, new concerns arise or you are not improving with treatment. Call 911 and/or seek emergency care if your symptoms are severe or life threatening.  Laryngitis Laryngitis is irritation and swelling (inflammation) of your vocal cords. This condition causes symptoms such as: A change in your voice. It may sound low and hoarse. Loss of voice. Coughing. Sore throat. Dry throat. Stuffy nose. Depending on the cause, this condition may go  away after a short time or may last for more than 3 weeks. Treatment often involves resting your voice andusing medicines to soothe your throat. Follow these instructions at home: Medicines Take over-the-counter and prescription medicines only as told by your doctor. If you were prescribed an antibiotic medicine,  take it as told by your doctor. Do not stop taking it even if you start to feel better. General instructions Talk as little as possible. Also avoid whispering. Write instead of talking. Do this until your voice is back to normal. Drink enough fluid to keep your pee (urine) pale yellow. Breathe in moist air. Use a humidifier if you live in a dry climate. Do not use any products that have nicotine or tobacco in them, such as cigarettes and e-cigarettes. If you need help quitting, ask your doctor. Contact a doctor if: You have a fever. Your pain is worse. Your symptoms do not get better in 2 weeks. Get help right away if: You cough up blood. You have trouble swallowing. You have trouble breathing. Summary Laryngitis is inflammation of your vocal cords. This condition causes your voice to sound low and hoarse. Rest your voice by talking as little as possible. Also avoid whispering. Get help right away if you have difficulty swallowing or breathing or if you cough up blood. This information is not intended to replace advice given to you by your health care provider. Make sure you discuss any questions you have with your healthcare provider. Document Revised: 06/24/2020 Document Reviewed: 06/24/2020 Elsevier Patient Education  2022 [employer].

## 2021-03-14 NOTE — Progress Notes (Signed)
Virtual Visit via Video Note  I connected with Timya  on 03/14/21 at 10:20 AM EDT by a video enabled telemedicine application and verified that I am speaking with the correct person using two identifiers.  Location patient: home, [place] Location provider:work or home office Persons participating in the virtual visit: patient, provider  I discussed the limitations of evaluation and management by telemedicine and the availability of in person appointments. The patient expressed understanding and agreed to proceed.   HPI:  Acute telemedicine visit for laryngitis: -Onset:2 days ago -Symptoms include: cough, pnd, hoarseness, head feels full -Denies: fevers, CP, SOB, NVD, inability to eat/drink/get out of bed -mom was sick with a cough last week -Has tried:hot lemon tea -Pertinent past medical history: allergy -Pertinent medication allergies:  Allergies  Allergen Reactions   Penicillins Anaphylaxis  -COVID-19 vaccine status: had 2 doses + 1 booster  ROS: See pertinent positives and negatives per HPI.  Past Medical History:  Diagnosis Date   Allergy    seasonal allergies   Osteoporosis     Past Surgical History:  Procedure Laterality Date   rotator cuff surgery     TOE SURGERY       Current Outpatient Medications:    benzonatate (TESSALON PERLES) 100 MG capsule, Take 1 capsule (100 mg total) by mouth 3 (three) times daily as needed., Disp: 20 capsule, Rfl: 0   Calcium Citrate (CITRACAL PO), Take by mouth., Disp: , Rfl:    fluticasone (FLONASE) 50 MCG/ACT nasal spray, Place 1 spray into both nostrils daily., Disp: 16 g, Rfl: 2   ibandronate (BONIVA) 150 MG tablet, Take 1 tablet (150 mg total) by mouth every 30 (thirty) days. Take in morning with full glass of water, on empty stomach, do not take anything else by mouth or lie down for the next 60 min., Disp: 12 tablet, Rfl: 0   Multiple Vitamins-Minerals (MULTIVITAMIN ADULT EXTRA C PO), multivitamin  Take 1 tablet as directed by  oral route, Disp: , Rfl:   EXAM:  VITALS per patient if applicable:  GENERAL: alert, oriented, appears well and in no acute distress  HEENT: atraumatic, conjunttiva clear, no obvious abnormalities on inspection of external nose and ears  NECK: normal movements of the head and neck  LUNGS: on inspection no signs of respiratory distress, breathing rate appears normal, no obvious gross SOB, gasping or wheezing  CV: no obvious cyanosis  MS: moves all visible extremities without noticeable abnormality  PSYCH/NEURO: pleasant and cooperative, no obvious depression or anxiety, speech and thought processing grossly intact  ASSESSMENT AND PLAN:  Discussed the following assessment and plan:  Cough  Hoarseness  -we discussed possible serious and likely etiologies, options for evaluation and workup, limitations of telemedicine visit vs in person visit, treatment, treatment risks and precautions. Pt prefers to treat via telemedicine empirically rather than in person at this moment. Query VURI, viral laryngitis, covid19 vs other. She opted for tessalon for cough, other symptomatic care measures summarized in patient instructions and doing home covid test. She does not wish to do antiviral if covid as feels she is low risk. Discussed isolation if covid and work note provided. Precautions discussed, Work/School slipped offered: provided in patient instructions   Advised to seek prompt in person care if worsening, new symptoms arise, or if is not improving with treatment. Discussed options for inperson care if PCP office not available. Did let this patient know that I only do telemedicine on Tuesdays and Thursdays for [place]. Advised to schedule follow  up visit with PCP or [facility] if any further questions or concerns to avoid delays in care.   I discussed the assessment and treatment plan with the patient. The patient was provided an opportunity to ask questions and all were answered. The patient agreed  with the plan and demonstrated an understanding of the instructions.     Lucretia Kern, DO

## 2021-05-02 ENCOUNTER — Telehealth (INDEPENDENT_AMBULATORY_CARE_PROVIDER_SITE_OTHER): Payer: Federal, State, Local not specified - PPO | Admitting: Family Medicine

## 2021-05-02 ENCOUNTER — Encounter: Payer: Self-pay | Admitting: Family Medicine

## 2021-05-02 DIAGNOSIS — R067 Sneezing: Secondary | ICD-10-CM | POA: Diagnosis not present

## 2021-05-02 DIAGNOSIS — R059 Cough, unspecified: Secondary | ICD-10-CM | POA: Diagnosis not present

## 2021-05-02 DIAGNOSIS — R0981 Nasal congestion: Secondary | ICD-10-CM

## 2021-05-02 MED ORDER — BENZONATATE 100 MG PO CAPS: 100.0000 mg | ORAL_CAPSULE | Freq: Three times a day (TID) | ORAL | 0 refills | Status: DC | PRN

## 2021-05-02 NOTE — Progress Notes (Signed)
Virtual Visit via Video Note  I connected with Breanna Martinez  on 05/02/21 at  1:20 PM EDT by a video enabled telemedicine application and verified that I am speaking with the correct person using two identifiers.  Location patient: home, [place] Location provider:work or home office Persons participating in the virtual visit: patient, provider  I discussed the limitations of evaluation and management by telemedicine and the availability of in person appointments. The patient expressed understanding and agreed to proceed.   HPI:  Acute telemedicine visit for sinus issues: -Onset: 2 days ago -Symptoms include: nasal congestion, cough, sneezing, mild body aches, diarrhea -Denies: fever, CP, SOB, vomiting, inability to eat/drink/get out of bed, known sick contacts -Pertinent past medical history:see below -Pertinent medication allergies:  Allergies  Allergen Reactions   Penicillins Anaphylaxis  -COVID-19 vaccine status: vaccinated 2 doses and 1 booster  ROS: See pertinent positives and negatives per HPI.  Past Medical History:  Diagnosis Date   Allergy    seasonal allergies   Osteoporosis     Past Surgical History:  Procedure Laterality Date   rotator cuff surgery     TOE SURGERY       Current Outpatient Medications:    benzonatate (TESSALON PERLES) 100 MG capsule, Take 1 capsule (100 mg total) by mouth 3 (three) times daily as needed., Disp: 20 capsule, Rfl: 0   Calcium Citrate (CITRACAL PO), Take by mouth., Disp: , Rfl:    ibandronate (BONIVA) 150 MG tablet, Take 1 tablet (150 mg total) by mouth every 30 (thirty) days. Take in morning with full glass of water, on empty stomach, do not take anything else by mouth or lie down for the next 60 min., Disp: 12 tablet, Rfl: 0   Multiple Vitamins-Minerals (MULTIVITAMIN ADULT EXTRA C PO), multivitamin  Take 1 tablet as directed by oral route, Disp: , Rfl:   EXAM:  VITALS per patient if applicable:  GENERAL: alert, oriented, appears well  and in no acute distress  HEENT: atraumatic, conjunttiva clear, no obvious abnormalities on inspection of external nose and ears  NECK: normal movements of the head and neck  LUNGS: on inspection no signs of respiratory distress, breathing rate appears normal, no obvious gross SOB, gasping or wheezing  CV: no obvious cyanosis  MS: moves all visible extremities without noticeable abnormality  PSYCH/NEURO: pleasant and cooperative, no obvious depression or anxiety, speech and thought processing grossly intact  ASSESSMENT AND PLAN:  Discussed the following assessment and plan:  Nasal congestion  Cough  Sneezing  -we discussed possible serious and likely etiologies, options for evaluation and workup, limitations of telemedicine visit vs in person visit, treatment, treatment risks and precautions. Pt is agreeable to treatment via telemedicine at this moment. Query VURI, seasonal allergies, Covid19 vs other. She has opted to do home covid testing today and repeat in 2 days. If positive she knows she can contact a [facility] pharmacy or do a virtual visit follow up f she desires treatment. In the interim she opted for Tessalon rx for cough, Allegra once daily and restarting her flonase.  Work/School slipped offered: provided in patient instructions  Advised to seek prompt in person care if worsening, new symptoms arise, or if is not improving with treatment. Discussed options for inperson care if PCP office not available. Did let this patient know that I only do telemedicine on Tuesdays and Thursdays for [place]. Advised to schedule follow up visit with PCP or [facility] if any further questions or concerns to avoid delays in care.  I discussed the assessment and treatment plan with the patient. The patient was provided an opportunity to ask questions and all were answered. The patient agreed with the plan and demonstrated an understanding of the instructions.     Lucretia Kern, DO

## 2021-05-02 NOTE — Patient Instructions (Addendum)
   ---------------------------------------------------------------------------------------------------------------------------      WORK SLIP:  Patient Breanna Martinez,  31-Jan-1961, was seen for a medical visit today, 05/02/21 . Please excuse from work for a COVID like illness. If covid testing is positive we advise 10 days minimum from the onset of symptoms (04/30/21) PLUS 1 day of no fever and improved symptoms. Will defer to employer for a sooner return to work if covid testing is negative OR if symptoms have resolved, it is greater than 5 days since the positive test and the patient can wear a high-quality, tight fitting mask such as N95 or KN95 at all times for an additional 5 days. Would also suggest COVID19 antigen testing is negative prior to return.  Sincerely: E-signature: Dr. Colin Benton, DO [place] Ph: [phone]   ------------------------------------------------------------------------------------------------------------------------------    HOME CARE TIPS:  -Most pharmacies also offer testing and home test kits. If the Covid19 test is positive, please make a prompt follow up visit with your primary care office or contact a [facility] to discuss treatment options. Treatments for Covid19 are best given early in the course of the illness.   -I sent the medication(s) we discussed to your pharmacy: Meds ordered this encounter  Medications   benzonatate (TESSALON PERLES) 100 MG capsule    Sig: Take 1 capsule (100 mg total) by mouth 3 (three) times daily as needed.    Dispense:  20 capsule    Refill:  0    -start Allegra once daily and restart your flonase 2 sprays each nostril daily for 3 weeks  -can use tylenol or aleve if needed for fevers, aches and pains per instructions  -can use nasal saline a few times per day if you have nasal congestion; sometimes  a short course of Afrin nasal spray for 3 days can help with symptoms as  well  -stay hydrated, drink plenty of fluids and eat small healthy meals - avoid dairy  -can take 1000 IU (29mcg) Vit D3 and 100-500 mg of Vit C daily per instructions  -If the Covid test is positive, check out the [facility] website for more information on home care, transmission and treatment for COVID19  -follow up with your doctor in 2-3 days unless improving and feeling better  -stay home while sick, except to seek medical care. If you have COVID19, ideally it would be best to stay home for a full 10 days since the onset of symptoms PLUS one day of no fever and feeling better. Wear a good mask that fits snugly (such as N95 or KN95) if around others to reduce the risk of transmission.  It was nice to meet you today, and I really hope you are feeling better soon. I help [facility] out with telemedicine visits on Tuesdays and Thursdays and am available for visits on those days. If you have any concerns or questions following this visit please schedule a follow up visit with your Primary Care doctor or seek care at a local urgent care clinic to avoid delays in care.    Seek in person care or schedule a follow up video visit promptly if your symptoms worsen, new concerns arise or you are not improving with treatment. Call 911 and/or seek emergency care if your symptoms are severe or life threatening.

## 2021-05-15 ENCOUNTER — Encounter: Payer: Federal, State, Local not specified - PPO | Admitting: Family Medicine

## 2021-05-23 ENCOUNTER — Other Ambulatory Visit: Payer: Self-pay

## 2021-05-24 ENCOUNTER — Encounter: Payer: Self-pay | Admitting: Family Medicine

## 2021-05-24 ENCOUNTER — Ambulatory Visit (INDEPENDENT_AMBULATORY_CARE_PROVIDER_SITE_OTHER): Payer: Federal, State, Local not specified - PPO | Admitting: Family Medicine

## 2021-05-24 VITALS — BP 110/80 | HR 79 | Temp 98.1°F | Ht 65.5 in | Wt 140.8 lb

## 2021-05-24 DIAGNOSIS — Z1159 Encounter for screening for other viral diseases: Secondary | ICD-10-CM

## 2021-05-24 DIAGNOSIS — E559 Vitamin D deficiency, unspecified: Secondary | ICD-10-CM

## 2021-05-24 DIAGNOSIS — Z23 Encounter for immunization: Secondary | ICD-10-CM | POA: Diagnosis not present

## 2021-05-24 DIAGNOSIS — E119 Type 2 diabetes mellitus without complications: Secondary | ICD-10-CM | POA: Diagnosis not present

## 2021-05-24 DIAGNOSIS — M810 Age-related osteoporosis without current pathological fracture: Secondary | ICD-10-CM

## 2021-05-24 DIAGNOSIS — Z0000 Encounter for general adult medical examination without abnormal findings: Secondary | ICD-10-CM | POA: Diagnosis not present

## 2021-05-24 LAB — CBC WITH DIFFERENTIAL/PLATELET
Basophils Absolute: 0 10*3/uL (ref 0.0–0.1)
Basophils Relative: 0.8 % (ref 0.0–3.0)
Eosinophils Absolute: 0.1 10*3/uL (ref 0.0–0.7)
Eosinophils Relative: 2.4 % (ref 0.0–5.0)
HCT: 37.9 % (ref 36.0–46.0)
Hemoglobin: 12.3 g/dL (ref 12.0–15.0)
Lymphocytes Relative: 44.3 % (ref 12.0–46.0)
Lymphs Abs: 1.9 10*3/uL (ref 0.7–4.0)
MCHC: 32.4 g/dL (ref 30.0–36.0)
MCV: 84.4 fl (ref 78.0–100.0)
Monocytes Absolute: 0.3 10*3/uL (ref 0.1–1.0)
Monocytes Relative: 7.9 % (ref 3.0–12.0)
Neutro Abs: 2 10*3/uL (ref 1.4–7.7)
Neutrophils Relative %: 44.6 % (ref 43.0–77.0)
Platelets: 295 10*3/uL (ref 150.0–400.0)
RBC: 4.49 Mil/uL (ref 3.87–5.11)
RDW: 13.9 % (ref 11.5–15.5)
WBC: 4.4 10*3/uL (ref 4.0–10.5)

## 2021-05-24 LAB — BASIC METABOLIC PANEL
BUN: 14 mg/dL (ref 6–23)
CO2: 26 mEq/L (ref 19–32)
Calcium: 9.7 mg/dL (ref 8.4–10.5)
Chloride: 102 mEq/L (ref 96–112)
Creatinine, Ser: 0.6 mg/dL (ref 0.40–1.20)
GFR: 97.69 mL/min (ref >60.00)
Glucose, Bld: 144 mg/dL — ABNORMAL HIGH (ref 70–99)
Potassium: 3.7 mEq/L (ref 3.5–5.1)
Sodium: 138 mEq/L (ref 135–145)

## 2021-05-24 LAB — T4, FREE: Free T4: 0.66 ng/dL (ref 0.60–1.60)

## 2021-05-24 LAB — HEMOGLOBIN A1C: Hgb A1c MFr Bld: 7.5 % — ABNORMAL HIGH (ref 4.6–6.5)

## 2021-05-24 LAB — LIPID PANEL
Cholesterol: 172 mg/dL (ref 0–200)
HDL: 61.6 mg/dL (ref >39.00)
LDL Cholesterol: 80 mg/dL (ref 0–99)
NonHDL: 110.45
Total CHOL/HDL Ratio: 3
Triglycerides: 151 mg/dL — ABNORMAL HIGH (ref 0.0–149.0)
VLDL: 30.2 mg/dL (ref 0.0–40.0)

## 2021-05-24 LAB — TSH: TSH: 3.58 u[IU]/mL (ref 0.35–5.50)

## 2021-05-24 LAB — VITAMIN D 25 HYDROXY (VIT D DEFICIENCY, FRACTURES): VITD: 26.64 ng/mL — ABNORMAL LOW (ref 30.00–100.00)

## 2021-05-24 NOTE — Progress Notes (Signed)
Subjective:     Breanna Martinez is a 60 y.o. female and is here for a comprehensive physical exam. The patient states she is doing well.  Was helping her sister take care of her mom.  Mammogram to be scheduled.  Social History   Socioeconomic History   Marital status: Single    Spouse name: Not on file   Number of children: Not on file   Years of education: Not on file   Highest education level: Not on file  Occupational History   Not on file  Tobacco Use   Smoking status: Never   Smokeless tobacco: Never  Vaping Use   Vaping Use: Never used  Substance and Sexual Activity   Alcohol use: Yes    Comment: socially    Drug use: No   Sexual activity: Not on file  Other Topics Concern   Not on file  Social History Narrative   Not on file   Social Determinants of Health   Financial Resource Strain: Not on file  Food Insecurity: Not on file  Transportation Needs: Not on file  Physical Activity: Not on file  Stress: Not on file  Social Connections: Not on file  Intimate Partner Violence: Not on file   Health Maintenance  Topic Date Due   Pneumococcal Vaccine 11-86 Years old (1 - PCV) Never done   HIV Screening  Never done   Hepatitis C Screening  Never done   Zoster Vaccines- Shingrix (2 of 2) 12/12/2020   URINE MICROALBUMIN  05/19/2021   PAP SMEAR-Modifier  09/02/2021 (Originally 02/26/1982)   COVID-19 Vaccine (4 - Booster for Breanna Martinez series) 09/02/2021 (Originally 01/01/2021)   INFLUENZA VACCINE  11/03/2021 (Originally 03/06/2021)   MAMMOGRAM  04/21/2022   TETANUS/TDAP  10/08/2024   COLONOSCOPY (Pts 45-39yrs Insurance coverage will need to be confirmed)  08/26/2027   HPV VACCINES  Aged Out    The following portions of the patient's history were reviewed and updated as appropriate: allergies, current medications, past family history, past medical history, past social history, past surgical history, and problem list.  Review of Systems Pertinent items noted in HPI and  remainder of comprehensive ROS otherwise negative.   Objective:    BP 110/80 (BP Location: Right Arm, Patient Position: Sitting, Cuff Size: Normal)   Pulse 79   Temp 98.1 F (36.7 C) (Oral)   Ht 5' 5.5" (1.664 m)   Wt 140 lb 12.8 oz (63.9 kg)   SpO2 96%   BMI 23.07 kg/m  General appearance: alert, cooperative, and no distress Head: Normocephalic, without obvious abnormality, atraumatic Eyes: conjunctivae/corneas clear. PERRL, EOM's intact. Fundi benign. Ears: normal TM's and external ear canals both ears Nose: Nares normal. Septum midline. Mucosa normal. No drainage or sinus tenderness. Throat: lips, mucosa, and tongue normal; teeth and gums normal Neck: no adenopathy, no carotid bruit, no JVD, supple, symmetrical, trachea midline, and thyroid not enlarged, symmetric, no tenderness/mass/nodules Lungs: clear to auscultation bilaterally Heart: regular rate and rhythm, S1, S2 normal, no murmur, click, rub or gallop Abdomen: soft, non-tender; bowel sounds normal; no masses,  no organomegaly Extremities: extremities normal, atraumatic, no cyanosis or edema Pulses: 2+ and symmetric Skin: Skin color, texture, turgor normal. No rashes or lesions Lymph nodes: Cervical, supraclavicular, and axillary nodes normal. Neurologic: Alert and oriented X 3, normal strength and tone. Normal symmetric reflexes. Normal coordination and gait    Assessment:    Healthy female exam.      Plan:   Anticipatory guidance given including  wearing seatbelts, smoke detectors in the home, increasing physical activity, increasing p.o. intake of water and vegetables. -labs -pt to schedule mammogram -colonoscopy up to date, done 08/25/20  -discussed immunizations -given handout -next CPE in 1 yr See After Visit Summary for Counseling Recommendations   Need for shingles vaccine  - Plan: Varicella-zoster vaccine IM (Shingrix)  Diet-controlled diabetes mellitus ([place])  -continue lifestyle modifications -foot  exam and eye exam needed. -obtain labs.  Further recommendations based on results - Plan: Microalbumin/Creatinine Ratio, Urine, Hemoglobin A1c, Lipid panel  Encounter for hepatitis C screening test for low risk patient  - Plan: Hep C Antibody  Osteoporosis, unspecified osteoporosis type, unspecified pathological fracture presence  -seen on bone density scan done 02/17/18 -continue weight bearing exercise and boniva -discussed repeating bone density scan - Plan: Vitamin D, 25-hydroxy  F/u prn  Update: vit D def noted.  Ergocalciferol 50,000IU wkly x 12 wks, rx sent to pharmacy. Hgb A1C elevated, consider starting Metformin 500 mg daily with plans to increase dose as tolerated.  Triglycerides mildly elevated.  Continue lifestyle modifications.  Grier Mitts, MD

## 2021-05-24 NOTE — Patient Instructions (Signed)
Your last bone density scan was in 2019.  Consider having this repeated.  In order for this can be placed if he would like to have this done this year.

## 2021-05-25 LAB — MICROALBUMIN / CREATININE URINE RATIO
Creatinine,U: 197.9 mg/dL
Microalb Creat Ratio: 1.8 mg/g (ref 0.0–30.0)
Microalb, Ur: 3.5 mg/dL — ABNORMAL HIGH (ref 0.0–1.9)

## 2021-05-25 LAB — HEPATITIS C ANTIBODY
Hepatitis C Ab: NONREACTIVE
SIGNAL TO CUT-OFF: 0.05 (ref <1.00)

## 2021-05-26 DIAGNOSIS — Z124 Encounter for screening for malignant neoplasm of cervix: Secondary | ICD-10-CM | POA: Diagnosis not present

## 2021-05-26 DIAGNOSIS — Z01419 Encounter for gynecological examination (general) (routine) without abnormal findings: Secondary | ICD-10-CM | POA: Diagnosis not present

## 2021-05-26 DIAGNOSIS — E119 Type 2 diabetes mellitus without complications: Secondary | ICD-10-CM | POA: Diagnosis not present

## 2021-05-26 DIAGNOSIS — M810 Age-related osteoporosis without current pathological fracture: Secondary | ICD-10-CM | POA: Diagnosis not present

## 2021-05-26 LAB — HM PAP SMEAR: HM Pap smear: NEGATIVE

## 2021-06-05 MED ORDER — VITAMIN D (ERGOCALCIFEROL) 1.25 MG (50000 UNIT) PO CAPS
50000.0000 [IU] | ORAL_CAPSULE | ORAL | 0 refills | Status: DC
Start: 2021-06-05 — End: 2022-08-13

## 2021-06-09 DIAGNOSIS — M85851 Other specified disorders of bone density and structure, right thigh: Secondary | ICD-10-CM | POA: Diagnosis not present

## 2021-06-09 DIAGNOSIS — M85852 Other specified disorders of bone density and structure, left thigh: Secondary | ICD-10-CM | POA: Diagnosis not present

## 2021-06-09 DIAGNOSIS — N951 Menopausal and female climacteric states: Secondary | ICD-10-CM | POA: Diagnosis not present

## 2021-06-09 DIAGNOSIS — M810 Age-related osteoporosis without current pathological fracture: Secondary | ICD-10-CM | POA: Diagnosis not present

## 2021-06-09 DIAGNOSIS — Z1231 Encounter for screening mammogram for malignant neoplasm of breast: Secondary | ICD-10-CM | POA: Diagnosis not present

## 2021-06-09 DIAGNOSIS — Z13820 Encounter for screening for osteoporosis: Secondary | ICD-10-CM | POA: Diagnosis not present

## 2021-06-20 DIAGNOSIS — H524 Presbyopia: Secondary | ICD-10-CM | POA: Diagnosis not present

## 2021-06-20 DIAGNOSIS — E119 Type 2 diabetes mellitus without complications: Secondary | ICD-10-CM | POA: Diagnosis not present

## 2021-06-23 DIAGNOSIS — R922 Inconclusive mammogram: Secondary | ICD-10-CM | POA: Diagnosis not present

## 2021-06-23 DIAGNOSIS — N6002 Solitary cyst of left breast: Secondary | ICD-10-CM | POA: Diagnosis not present

## 2021-06-23 LAB — HM MAMMOGRAPHY

## 2021-06-26 ENCOUNTER — Encounter: Payer: Self-pay | Admitting: Family Medicine

## 2021-07-10 ENCOUNTER — Other Ambulatory Visit: Payer: Self-pay | Admitting: Radiology

## 2021-07-10 DIAGNOSIS — N6322 Unspecified lump in the left breast, upper inner quadrant: Secondary | ICD-10-CM | POA: Diagnosis not present

## 2021-07-10 HISTORY — PX: BREAST BIOPSY: SHX20

## 2021-07-11 DIAGNOSIS — H40013 Open angle with borderline findings, low risk, bilateral: Secondary | ICD-10-CM | POA: Diagnosis not present

## 2021-07-28 DIAGNOSIS — Z20822 Contact with and (suspected) exposure to covid-19: Secondary | ICD-10-CM | POA: Diagnosis not present

## 2021-08-05 DIAGNOSIS — Z20822 Contact with and (suspected) exposure to covid-19: Secondary | ICD-10-CM | POA: Diagnosis not present

## 2021-08-10 ENCOUNTER — Ambulatory Visit: Payer: Federal, State, Local not specified - PPO | Admitting: Family Medicine

## 2021-08-10 ENCOUNTER — Encounter: Payer: Self-pay | Admitting: Family Medicine

## 2021-08-10 VITALS — BP 108/70 | HR 73 | Temp 98.1°F | Wt 143.0 lb

## 2021-08-10 DIAGNOSIS — Z7185 Encounter for immunization safety counseling: Secondary | ICD-10-CM

## 2021-08-10 DIAGNOSIS — E559 Vitamin D deficiency, unspecified: Secondary | ICD-10-CM | POA: Diagnosis not present

## 2021-08-10 DIAGNOSIS — E119 Type 2 diabetes mellitus without complications: Secondary | ICD-10-CM | POA: Diagnosis not present

## 2021-08-10 DIAGNOSIS — R202 Paresthesia of skin: Secondary | ICD-10-CM | POA: Diagnosis not present

## 2021-08-10 DIAGNOSIS — M810 Age-related osteoporosis without current pathological fracture: Secondary | ICD-10-CM

## 2021-08-10 MED ORDER — IBANDRONATE SODIUM 150 MG PO TABS: 150.0000 mg | ORAL_TABLET | ORAL | 0 refills | Status: DC

## 2021-08-10 NOTE — Progress Notes (Signed)
Subjective:    Patient ID: Breanna Martinez, female    DOB: 06/30/1961, 61 y.o.   MRN: [id number]  Chief Complaint  Patient presents with   Follow-up    F/u on labs/lab work -Triglycerides mildly elevated. Has been exercising.     HPI Patient was seen today for f/u.  Doing well.  Working out at [employer] several x per wk. making changes to diet as concerned about mildly elevated triglycerides.  Patient requesting refill on Boniva for history of osteoporosis.  Last bone density scan 06/09/2021.  Patient with prior history of left foot surgery.  Endorses numbness/tingling of the left third and fourth digits.  Often occurs at night.  Patient denies wearing heels or pointy shoes, LE edema.  Patient plans on spending time in [place] tomorrow with family for a birthday celebration for multiple people.  Past Medical History:  Diagnosis Date   Allergy    seasonal allergies   Osteoporosis     Allergies  Allergen Reactions   Penicillins Anaphylaxis    ROS General: Denies fever, chills, night sweats, changes in weight, changes in appetite HEENT: Denies headaches, ear pain, changes in vision, rhinorrhea, sore throat CV: Denies CP, palpitations, SOB, orthopnea Pulm: Denies SOB, cough, wheezing GI: Denies abdominal pain, nausea, vomiting, diarrhea, constipation GU: Denies dysuria, hematuria, frequency, vaginal discharge Msk: Denies muscle cramps, joint pains Neuro: Denies weakness + numbness, tingling left foot third and fourth digit Skin: Denies rashes, bruising Psych: Denies depression, anxiety, hallucinations     Objective:    Blood pressure 108/70, pulse 73, temperature 98.1 F (36.7 C), temperature source Oral, weight 143 lb (64.9 kg), SpO2 97 %.  Gen. Pleasant, well-nourished, in no distress, normal affect   HEENT: [place]/AT, face symmetric, conjunctiva clear, no scleral icterus, PERRLA, EOMI, nares patent without drainage Lungs: no accessory muscle use, CTAB, no wheezes or  rales Cardiovascular: RRR, no m/r/g, no peripheral edema.  DP and PT pulses bilaterally 3+. Musculoskeletal: No deformities, no cyanosis or clubbing, normal tone Neuro:  A&Ox3, CN II-XII intact, normal gait.  Vibratory sensation and monofilament testing intact. Skin:  Warm, no lesions/ rash.  Well-healed surgical incision on dorsum of left foot, plantar surface of left foot between third and fourth digit with deep fissure, well-healed, firm to palpation.  Small corn on left fourth digit.   Wt Readings from Last 3 Encounters:  08/10/21 143 lb (64.9 kg)  05/24/21 140 lb 12.8 oz (63.9 kg)  03/02/21 142 lb 12.8 oz (64.8 kg)    Lab Results  Component Value Date   WBC 4.4 05/24/2021   HGB 12.3 05/24/2021   HCT 37.9 05/24/2021   PLT 295.0 05/24/2021   GLUCOSE 144 (H) 05/24/2021   CHOL 172 05/24/2021   TRIG 151.0 (H) 05/24/2021   HDL 61.60 05/24/2021   LDLCALC 80 05/24/2021   ALT 17 07/08/2017   AST 22 07/08/2017   NA 138 05/24/2021   K 3.7 05/24/2021   CL 102 05/24/2021   CREATININE 0.60 05/24/2021   BUN 14 05/24/2021   CO2 26 05/24/2021   TSH 3.58 05/24/2021   HGBA1C 7.5 (H) 05/24/2021   MICROALBUR 3.5 (H) 05/25/2021    Assessment/Plan:  Paresthesia of left foot -Possibly 2/2 scar tissue from previous foot surgery -Discussed supportive care including massage, stretching of toes and foot  Osteoporosis, unspecified osteoporosis type, unspecified pathological fracture presence  -Noted on bone density scan 06/09/2021 -Continue weightbearing exercises - Plan: ibandronate (BONIVA) 150 MG tablet  Vitamin D deficiency -Vitamin  D 05/25/2019 to 26.64. -Patient to complete ergocalciferol 50,000 IU weekly then switch to OTC vitamin D  Type 2 diabetes mellitus without complication, without long-term current use of insulin -Hemoglobin A1c 7.5% on 05/24/2021 -Continue lifestyle modifications -Not currently on medication -Foot exam done this visit. -ACEI/ARB for renal protection not  started 2/2 patient's low normal BP -Continue to monitor  Immunization counseling -Discussed pneumonia vaccine(s).  Patient wishes to wait at this time -Shingles vaccine up-to-date   F/u in 4 months, sooner if needed  More than 50% of over 32 minutes spent in total caring for this patient was spent face-to-face, reviewing the chart, counseling and/or coordinating care.    Grier Mitts, MD

## 2021-09-06 ENCOUNTER — Encounter: Payer: Self-pay | Admitting: Family Medicine

## 2021-09-06 ENCOUNTER — Telehealth: Payer: Federal, State, Local not specified - PPO | Admitting: Family Medicine

## 2021-09-08 ENCOUNTER — Telehealth: Payer: Self-pay

## 2021-09-08 NOTE — Telephone Encounter (Signed)
No message attached

## 2021-09-08 NOTE — Telephone Encounter (Signed)
Last Ov 08/10/21 Please advise

## 2021-09-11 ENCOUNTER — Encounter: Payer: Self-pay | Admitting: Family Medicine

## 2021-09-12 ENCOUNTER — Telehealth: Payer: Self-pay | Admitting: Nurse Practitioner

## 2021-09-12 NOTE — Telephone Encounter (Signed)
Pt is wanting to transfer her care to [facility] from Dr. Volanda Napoleon. Please let me know and I will call her.

## 2021-09-13 NOTE — Telephone Encounter (Signed)
Pt's best friend told her she should come here because it's a great place.

## 2021-09-15 NOTE — Telephone Encounter (Signed)
Its fine with me

## 2021-11-06 ENCOUNTER — Ambulatory Visit: Payer: Federal, State, Local not specified - PPO | Admitting: Nurse Practitioner

## 2021-11-06 ENCOUNTER — Encounter: Payer: Self-pay | Admitting: Nurse Practitioner

## 2021-11-06 ENCOUNTER — Ambulatory Visit: Payer: Federal, State, Local not specified - PPO

## 2021-11-06 ENCOUNTER — Ambulatory Visit (INDEPENDENT_AMBULATORY_CARE_PROVIDER_SITE_OTHER): Payer: Federal, State, Local not specified - PPO

## 2021-11-06 VITALS — BP 100/64 | HR 86 | Temp 97.7°F | Ht 64.25 in | Wt 142.2 lb

## 2021-11-06 DIAGNOSIS — M810 Age-related osteoporosis without current pathological fracture: Secondary | ICD-10-CM | POA: Diagnosis not present

## 2021-11-06 DIAGNOSIS — E559 Vitamin D deficiency, unspecified: Secondary | ICD-10-CM | POA: Diagnosis not present

## 2021-11-06 DIAGNOSIS — M79675 Pain in left toe(s): Secondary | ICD-10-CM

## 2021-11-06 DIAGNOSIS — E1165 Type 2 diabetes mellitus with hyperglycemia: Secondary | ICD-10-CM | POA: Diagnosis not present

## 2021-11-06 DIAGNOSIS — M79674 Pain in right toe(s): Secondary | ICD-10-CM

## 2021-11-06 DIAGNOSIS — M7989 Other specified soft tissue disorders: Secondary | ICD-10-CM | POA: Diagnosis not present

## 2021-11-06 DIAGNOSIS — M19071 Primary osteoarthritis, right ankle and foot: Secondary | ICD-10-CM | POA: Diagnosis not present

## 2021-11-06 LAB — BASIC METABOLIC PANEL
BUN: 16 mg/dL (ref 6–23)
CO2: 24 mEq/L (ref 19–32)
Calcium: 9.7 mg/dL (ref 8.4–10.5)
Chloride: 104 mEq/L (ref 96–112)
Creatinine, Ser: 0.57 mg/dL (ref 0.40–1.20)
GFR: 98.59 mL/min (ref >60.00)
Glucose, Bld: 132 mg/dL — ABNORMAL HIGH (ref 70–99)
Potassium: 3.7 mEq/L (ref 3.5–5.1)
Sodium: 137 mEq/L (ref 135–145)

## 2021-11-06 LAB — LIPID PANEL
Cholesterol: 162 mg/dL (ref 0–200)
HDL: 60.2 mg/dL (ref >39.00)
LDL Cholesterol: 79 mg/dL (ref 0–99)
NonHDL: 101.84
Total CHOL/HDL Ratio: 3
Triglycerides: 112 mg/dL (ref 0.0–149.0)
VLDL: 22.4 mg/dL (ref 0.0–40.0)

## 2021-11-06 LAB — HEMOGLOBIN A1C: Hgb A1c MFr Bld: 7.7 % — ABNORMAL HIGH (ref 4.6–6.5)

## 2021-11-06 LAB — VITAMIN D 25 HYDROXY (VIT D DEFICIENCY, FRACTURES): VITD: 32.3 ng/mL (ref 30.00–100.00)

## 2021-11-06 MED ORDER — BLOOD GLUCOSE MONITOR KIT: PACK | 0 refills | Status: DC

## 2021-11-06 NOTE — Assessment & Plan Note (Addendum)
Last hgbA1c at 7.5% ?No glucose check at home ?No medication at this time ?Has made lifestyle modifications since 05/2021. ? ?Repeat hgbA1c today: 7.7% ?Normal renal function and LDL at goal ?Start metformin '500mg'$  BID ?F/*up in 25month ?

## 2021-11-06 NOTE — Patient Instructions (Addendum)
Sign medical release to get DEXA scan report. ? ?Ask insurance about prolia injection coverage. ? ?Check glucose every other day, twice in AM and twice in PM ? ?Go to lab for blood draw and x-ray. ? ?Diabetes Mellitus and Nutrition, Adult ?When you have diabetes, or diabetes mellitus, it is very important to have healthy eating habits because your blood sugar (glucose) levels are greatly affected by what you eat and drink. Eating healthy foods in the right amounts, at about the same times every day, can help you: ?Manage your blood glucose. ?Lower your risk of heart disease. ?Improve your blood pressure. ?Reach or maintain a healthy weight. ?What can affect my meal plan? ?Every person with diabetes is different, and each person has different needs for a meal plan. Your health care provider may recommend that you work with a dietitian to make a meal plan that is best for you. Your meal plan may vary depending on factors such as: ?The calories you need. ?The medicines you take. ?Your weight. ?Your blood glucose, blood pressure, and cholesterol levels. ?Your activity level. ?Other health conditions you have, such as heart or kidney disease. ?How do carbohydrates affect me? ?Carbohydrates, also called carbs, affect your blood glucose level more than any other type of food. Eating carbs raises the amount of glucose in your blood. ?It is important to know how many carbs you can safely have in each meal. This is different for every person. Your dietitian can help you calculate how many carbs you should have at each meal and for each snack. ?How does alcohol affect me? ?Alcohol can cause a decrease in blood glucose (hypoglycemia), especially if you use insulin or take certain diabetes medicines by mouth. Hypoglycemia can be a life-threatening condition. Symptoms of hypoglycemia, such as sleepiness, dizziness, and confusion, are similar to symptoms of having too much alcohol. ?Do not drink alcohol if: ?Your health care  provider tells you not to drink. ?You are pregnant, may be pregnant, or are planning to become pregnant. ?If you drink alcohol: ?Limit how much you have to: ?0-1 drink a day for women. ?0-2 drinks a day for men. ?Know how much alcohol is in your drink. In the [place] one drink equals one 12 oz bottle of beer (355 mL), one 5 oz glass of wine (148 mL), or one 1? oz glass of hard liquor (44 mL). ?Keep yourself hydrated with water, diet soda, or unsweetened iced tea. Keep in mind that regular soda, juice, and other mixers may contain a lot of sugar and must be counted as carbs. ?What are tips for following this plan? ?Reading food labels ?Start by checking the serving size on the Nutrition Facts label of packaged foods and drinks. The number of calories and the amount of carbs, fats, and other nutrients listed on the label are based on one serving of the item. Many items contain more than one serving per package. ?Check the total grams (g) of carbs in one serving. ?Check the number of grams of saturated fats and trans fats in one serving. Choose foods that have a low amount or none of these fats. ?Check the number of milligrams (mg) of salt (sodium) in one serving. Most people should limit total sodium intake to less than 2,300 mg per day. ?Always check the nutrition information of foods labeled as "low-fat" or "nonfat." These foods may be higher in added sugar or refined carbs and should be avoided. ?Talk to your dietitian to identify your daily goals for nutrients  listed on the label. ?Shopping ?Avoid buying canned, pre-made, or processed foods. These foods tend to be high in fat, sodium, and added sugar. ?Shop around the outside edge of the grocery store. This is where you will most often find fresh fruits and vegetables, bulk grains, fresh meats, and fresh dairy products. ?Cooking ?Use low-heat cooking methods, such as baking, instead of high-heat cooking methods, such as deep frying. ?Cook using healthy oils, such as  olive, canola, or sunflower oil. ?Avoid cooking with butter, cream, or high-fat meats. ?Meal planning ?Eat meals and snacks regularly, preferably at the same times every day. Avoid going long periods of time without eating. ?Eat foods that are high in fiber, such as fresh fruits, vegetables, beans, and whole grains. ?Eat 4-6 oz (112-168 g) of lean protein each day, such as lean meat, chicken, fish, eggs, or tofu. One ounce (oz) (28 g) of lean protein is equal to: ?1 oz (28 g) of meat, chicken, or fish. ?1 egg. ?? cup (62 g) of tofu. ?Eat some foods each day that contain healthy fats, such as avocado, nuts, seeds, and fish. ?What foods should I eat? ?Fruits ?Berries. Apples. Oranges. Peaches. Apricots. Plums. Grapes. Mangoes. Papayas. Pomegranates. Kiwi. Cherries. ?Vegetables ?Leafy greens, including lettuce, spinach, kale, chard, collard greens, mustard greens, and cabbage. Beets. Cauliflower. Broccoli. Carrots. Green beans. Tomatoes. Peppers. Onions. Cucumbers. Brussels sprouts. ?Grains ?Whole grains, such as whole-wheat or whole-grain bread, crackers, tortillas, cereal, and pasta. Unsweetened oatmeal. Quinoa. Brown or wild rice. ?Meats and other proteins ?Seafood. Poultry without skin. Lean cuts of poultry and beef. Tofu. Nuts. Seeds. ?Dairy ?Low-fat or fat-free dairy products such as milk, yogurt, and cheese. ?The items listed above may not be a complete list of foods and beverages you can eat and drink. Contact a dietitian for more information. ?What foods should I avoid? ?Fruits ?Fruits canned with syrup. ?Vegetables ?Canned vegetables. Frozen vegetables with butter or cream sauce. ?Grains ?Refined white flour and flour products such as bread, pasta, snack foods, and cereals. Avoid all processed foods. ?Meats and other proteins ?Fatty cuts of meat. Poultry with skin. Breaded or fried meats. Processed meat. Avoid saturated fats. ?Dairy ?Full-fat yogurt, cheese, or milk. ?Beverages ?Sweetened drinks, such as soda  or iced tea. ?The items listed above may not be a complete list of foods and beverages you should avoid. Contact a dietitian for more information. ?Questions to ask a health care provider ?Do I need to meet with a certified diabetes care and education specialist? ?Do I need to meet with a dietitian? ?What number can I call if I have questions? ?When are the best times to check my blood glucose? ?Where to find more information: ?American Diabetes Association: diabetes.org ?Academy of Nutrition and Dietetics: eatright.org ?[employer] of Diabetes and Digestive and Kidney Diseases: AmenCredit.is ?Association of Diabetes Care & Education Specialists: diabeteseducator.org ?Summary ?It is important to have healthy eating habits because your blood sugar (glucose) levels are greatly affected by what you eat and drink. It is important to use alcohol carefully. ?A healthy meal plan will help you manage your blood glucose and lower your risk of heart disease. ?Your health care provider may recommend that you work with a dietitian to make a meal plan that is best for you. ?This information is not intended to replace advice given to you by your health care provider. Make sure you discuss any questions you have with your health care provider. ?Document Revised: 02/24/2020 Document Reviewed: 02/24/2020 ?Elsevier Patient Education ? 2022 Elsevier  Inc. ? ? ? ? ?

## 2021-11-06 NOTE — Assessment & Plan Note (Signed)
Current use of boniva monthly.  ?No adverse side effects ?Last dexa scan within last 65month per patient. Requested [place] from [facility] ?Advised to maintain calcium '600mg'$  and vit. D 1000IU daily ?Repeat BMP ?

## 2021-11-06 NOTE — Assessment & Plan Note (Addendum)
Repeat Vit. D: normal ?1000IU daily OTC ?

## 2021-11-06 NOTE — Progress Notes (Signed)
? ?             Established Patient Visit ? ?Patient: Breanna Martinez   DOB: Jul 16, 1961   61 y.o. Female  MRN: [id number] ?Visit Date: 11/09/2021 ? ?Subjective:  ?  ?Chief Complaint  ?Patient presents with  ? Establish Care  ?  New patient/Pt would like her right pinky toe looked at after hitting it on her bed post the other night. Pt also has a numbness in her left second toe she would like to address.   ? ?HPI ?DM (diabetes mellitus) ([place]) ?Last hgbA1c at 7.5% ?No glucose check at home ?No medication at this time ?Has made lifestyle modifications since 05/2021. ? ?Repeat hgbA1c today: 7.7% ?Normal renal function and LDL at goal ?Start metformin 559m BID ?F/*up in 343month? ?Osteoporosis ?Current use of boniva monthly.  ?No adverse side effects ?Last dexa scan within last 1243monther patient. [facility] to maintain calcium 600m44md vit. D 1000IU daily ?Repeat BMP ? ?Vitamin D deficiency ?Repeat Vit. D: normal ?1000IU daily OTC ? ?Toe pain, bilateral ?S/p resection of neuroma in left foot, between 3rd and 5th toe(2018) ?developed numbness in Left 5th toe 74yrs38yr, worse at bedtime. ?Also had right 5th toe blunt trauma 2nights ago. Persistent pain with movement and swelling. ?She is able to walking with some limp. ? ?Get bilateral 5th toe x-ray. ?Advised to wear wide shoes with adequate cushion at the bottom ? ?Left foot surgery 4-35yrs 21yr?Resuidual paresthesia, worse at bedtime ?Foot injury 2-9yrs ?88yrewed medical, surgical, and social history today ? ?Medications: ?Outpatient Medications Prior to Visit  ?Medication Sig  ? Calcium Citrate (CITRACAL PO) Take by mouth.  ? ibandronate (BONIVA) 150 MG tablet Take 1 tablet (150 mg total) by mouth every 30 (thirty) days. Take in morning with full glass of water, on empty stomach, do not take anything else by mouth or lie down for the next 60 min.  ? Multiple Vitamins-Minerals (MULTIVITAMIN ADULT EXTRA C PO) multivitamin ? Take 1  tablet as directed by oral route  ? Vitamin D, Ergocalciferol, (DRISDOL) 1.25 MG (50000 UNIT) CAPS capsule Take 1 capsule (50,000 Units total) by mouth every 7 (seven) days.  ? ?No facility-administered medications prior to visit.  ? ?Reviewed past medical and social history.  ? ?ROS per HPI above ? ? ?   ?Objective:  ?BP 100/64 (BP Location: Left Arm, Patient Position: Sitting, Cuff Size: Normal)   Pulse 86   Temp 97.7 ?F (36.5 ?C) (Temporal)   Ht 5' 4.25" (1.632 m)   Wt 142 lb 3.2 oz (64.5 kg)   SpO2 98%   BMI 24.22 kg/m?  ? ?  ? ?Physical Exam ?Vitals reviewed.  ?Cardiovascular:  ?   Pulses:     ?     Dorsalis pedis pulses are 2+ on the right side and 2+ on the left side.  ?     Posterior tibial pulses are 2+ on the right side and 2+ on the left side.  ?Musculoskeletal:     ?   General: Tenderness present. No swelling.  ?   Right lower leg: No edema.  ?   Left lower leg: No edema.  ?   Right foot: Normal range of motion. No prominent metatarsal heads.  ?   Left foot: Decreased range of motion. No prominent metatarsal heads.  ?     Feet: ? ?Feet:  ?   Right foot:  ?   Skin  integrity: No skin breakdown, erythema, callus or fissure.  ?   Toenail Condition: Right toenails are normal.  ?   Left foot:  ?   Skin integrity: No skin breakdown, erythema, callus or fissure.  ?   Toenail Condition: Left toenails are normal.  ?   Comments: Right 5th toe pain with flexion and extention. No pain over 5th metatarsal ?Skin: ?   General: Skin is warm and dry.  ?   Findings: No erythema.  ?Neurological:  ?   Mental Status: She is alert.  ?  ?Results for orders placed or performed in visit on 11/06/21  ?Vitamin D (25 hydroxy)  ?Result Value Ref Range  ? VITD 32.30 30.00 - 100.00 ng/mL  ?Hemoglobin A1c  ?Result Value Ref Range  ? Hgb A1c MFr Bld 7.7 (H) 4.6 - 6.5 %  ?Basic metabolic panel  ?Result Value Ref Range  ? Sodium 137 135 - 145 mEq/L  ? Potassium 3.7 3.5 - 5.1 mEq/L  ? Chloride 104 96 - 112 mEq/L  ? CO2 24 19 - 32  mEq/L  ? Glucose, Bld 132 (H) 70 - 99 mg/dL  ? BUN 16 6 - 23 mg/dL  ? Creatinine, Ser 0.57 0.40 - 1.20 mg/dL  ? GFR 98.59 >60.00 mL/min  ? Calcium 9.7 8.4 - 10.5 mg/dL  ?Lipid panel  ?Result Value Ref Range  ? Cholesterol 162 0 - 200 mg/dL  ? Triglycerides 112.0 0.0 - 149.0 mg/dL  ? HDL 60.20 >39.00 mg/dL  ? VLDL 22.4 0.0 - 40.0 mg/dL  ? LDL Cholesterol 79 0 - 99 mg/dL  ? Total CHOL/HDL Ratio 3   ? NonHDL 101.84   ? ?   ?Assessment & Plan:  ?  ?Problem List Items Addressed This Visit   ? ?  ? Endocrine  ? DM (diabetes mellitus) ([place])  ?  Last hgbA1c at 7.5% ?No glucose check at home ?No medication at this time ?Has made lifestyle modifications since 05/2021. ? ?Repeat hgbA1c today: 7.7% ?Normal renal function and LDL at goal ?Start metformin 572m BID ?F/*up in 363month?  ?  ? Relevant Medications  ? blood glucose meter kit and supplies KIT  ? metFORMIN (GLUCOPHAGE) 500 MG tablet  ? Other Relevant Orders  ? Hemoglobin A1c (Completed)  ? Basic metabolic panel (Completed)  ? Lipid panel (Completed)  ?  ? Musculoskeletal and Integument  ? Osteoporosis  ?  Current use of boniva monthly.  ?No adverse side effects ?Last dexa scan within last 1243monther patient. [facility]ed [facility] from [facility] to maintain calcium 600m48md vit. D 1000IU daily ?Repeat BMP ?  ?  ?  ? Other  ? Toe pain, bilateral - Primary  ?  S/p resection of neuroma in left foot, between 3rd and 5th toe(2018) ?developed numbness in Left 5th toe 77yrs106yr, worse at bedtime. ?Also had right 5th toe blunt trauma 2nights ago. Persistent pain with movement and swelling. ?She is able to walking with some limp. ? ?Get bilateral 5th toe x-ray. ?Advised to wear wide shoes with adequate cushion at the bottom ?  ?  ? Relevant Orders  ? DG Toe 5th Right (Completed)  ? DG Toe 5th Left (Completed)  ? Vitamin D deficiency  ?  Repeat Vit. D: normal ?1000IU daily OTC ?  ?  ? Relevant Orders  ? Vitamin D (25 hydroxy) (Completed)  ? ?Return in about 3 months  (around 02/05/2022) for DM. ? ?  ? ?CharlWilfred Lacy? ? ?

## 2021-11-06 NOTE — Assessment & Plan Note (Signed)
S/p resection of neuroma in left foot, between 3rd and 5th toe(2018) ?developed numbness in Left 5th toe 39yr ago, worse at bedtime. ?Also had right 5th toe blunt trauma 2nights ago. Persistent pain with movement and swelling. ?She is able to walking with some limp. ? ?Get bilateral 5th toe x-ray. ?Advised to wear wide shoes with adequate cushion at the bottom ?

## 2021-11-09 ENCOUNTER — Telehealth: Payer: Self-pay

## 2021-11-09 ENCOUNTER — Encounter: Payer: Self-pay | Admitting: Nurse Practitioner

## 2021-11-09 MED ORDER — METFORMIN HCL 500 MG PO TABS: 500.0000 mg | ORAL_TABLET | Freq: Two times a day (BID) | ORAL | 5 refills | Status: DC

## 2021-11-09 NOTE — Telephone Encounter (Signed)
When contacting pt to give lab results pt stated that she has spoken with her insurance and the prolia injection is covered, pt would like to know when she can start receiving the injection.  ?

## 2021-11-14 NOTE — Telephone Encounter (Signed)
Are you still doing Prolia PA's? ?

## 2021-11-20 IMAGING — DX DG HIP (WITH OR WITHOUT PELVIS) 2-3V*L*
2 series · 2 of 2 positions shown · non-contrast
Comparison: None.

CLINICAL DATA: 59-year-old female with left hip pain.

EXAM:
DG HIP (WITH OR WITHOUT PELVIS) 2-3V LEFT

[pelvis ap]
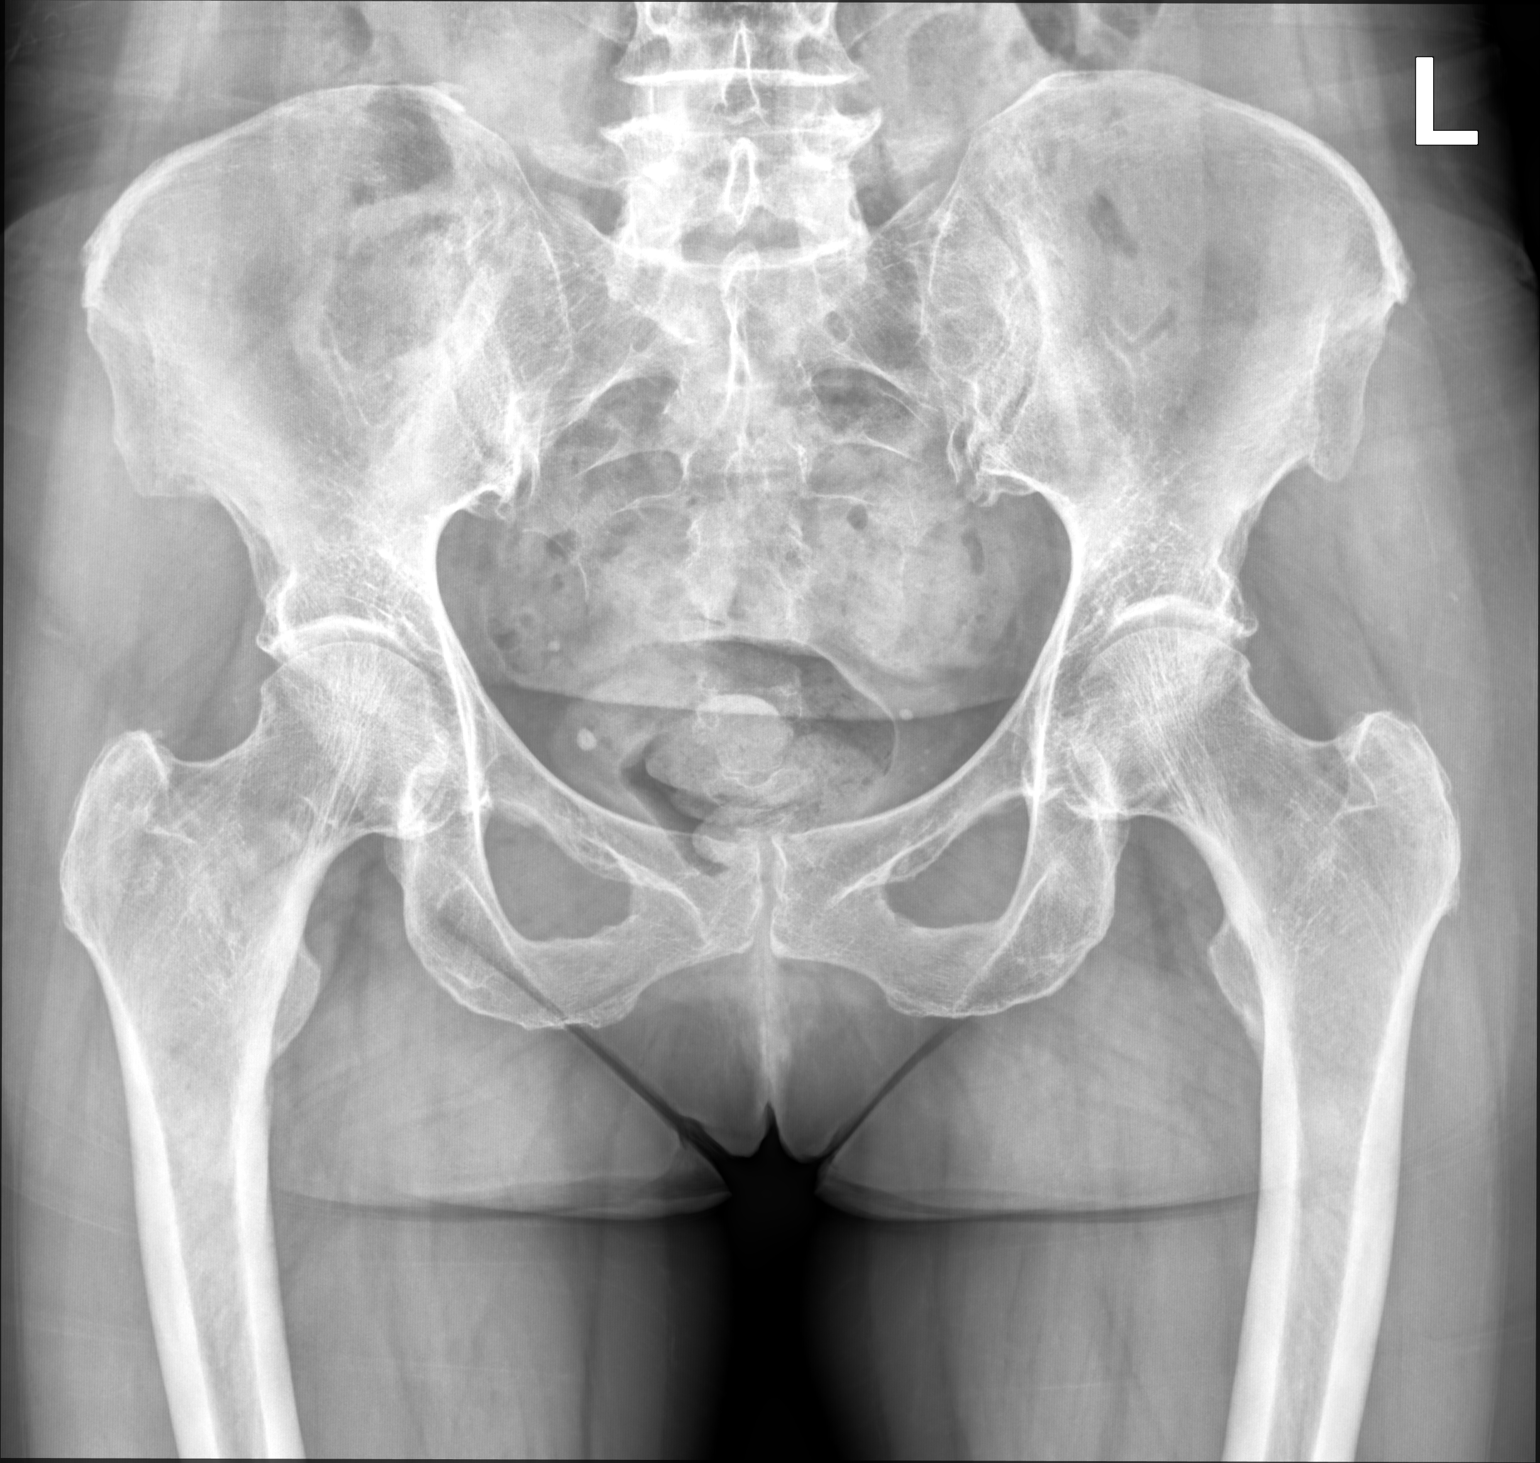

[hip (frog leg) lat]
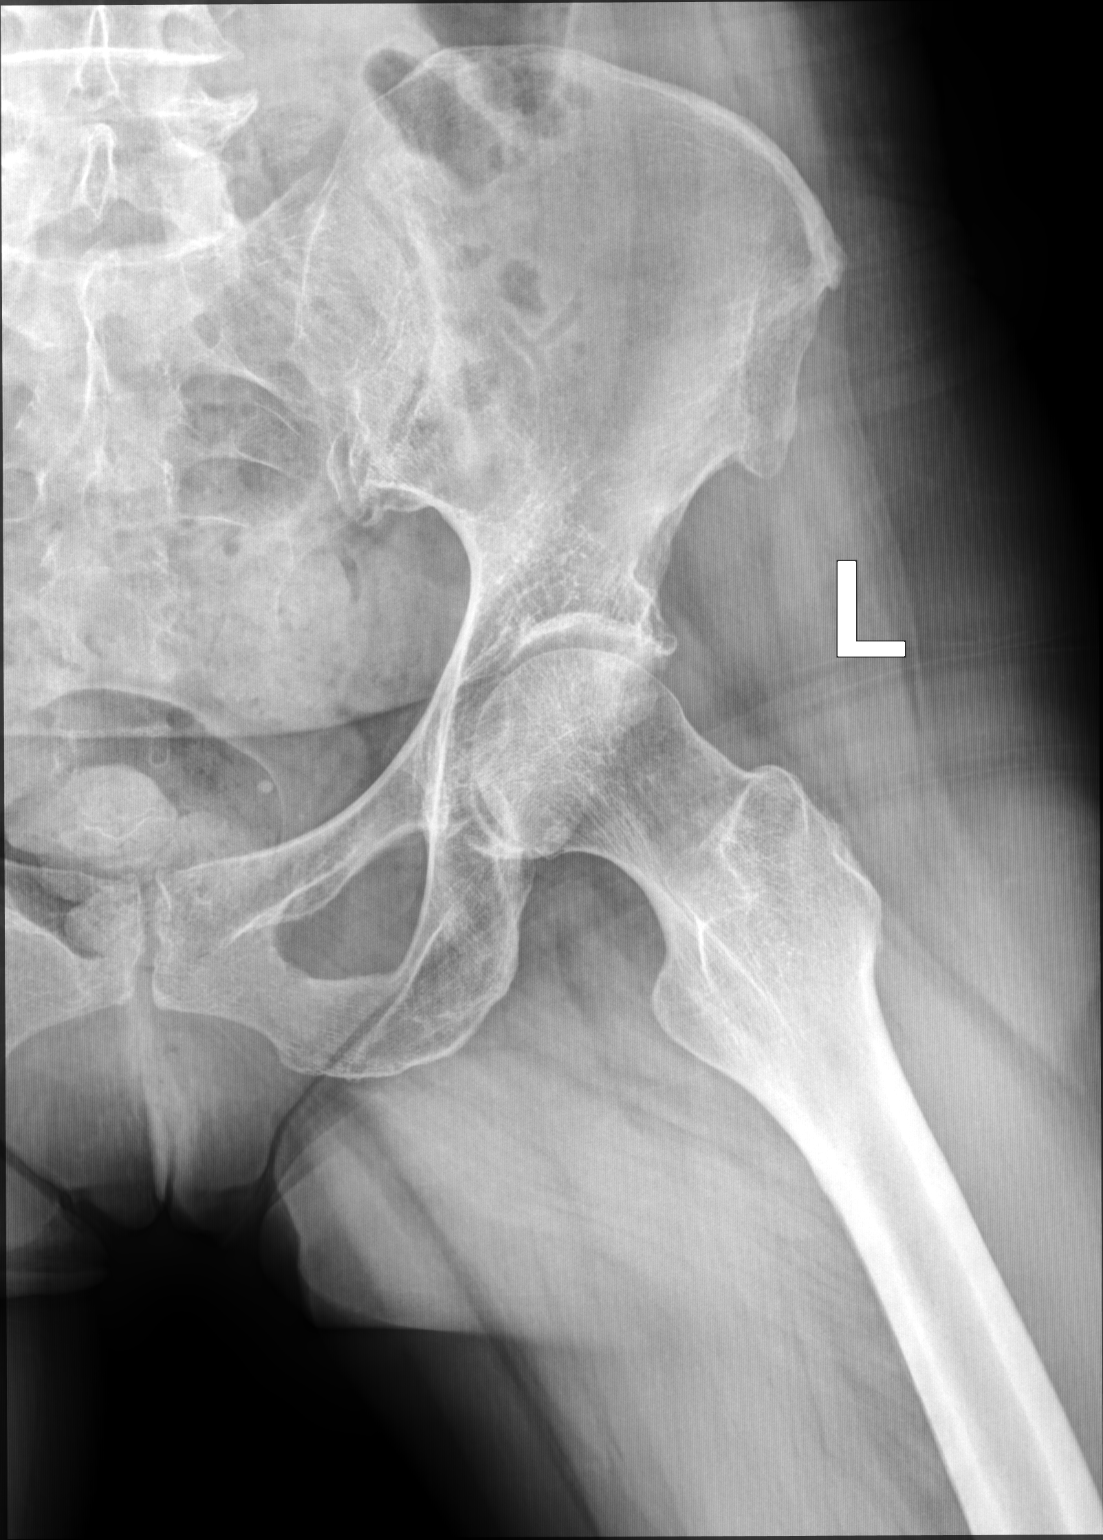

[2 of 2 positions shown; findings below may reference images not displayed]

FINDINGS: There is no acute fracture or dislocation. The bones are osteopenic.
Moderate bilateral hip arthritic changes. The soft tissues are
unremarkable.
IMPRESSION: 1. No acute fracture or dislocation.
2. Bilateral hip arthritic changes.

## 2021-12-04 ENCOUNTER — Encounter: Payer: Self-pay | Admitting: Nurse Practitioner

## 2021-12-12 ENCOUNTER — Encounter: Payer: Self-pay | Admitting: Nurse Practitioner

## 2021-12-19 ENCOUNTER — Other Ambulatory Visit: Payer: Self-pay

## 2021-12-19 DIAGNOSIS — Z1231 Encounter for screening mammogram for malignant neoplasm of breast: Secondary | ICD-10-CM

## 2021-12-28 ENCOUNTER — Other Ambulatory Visit: Payer: Self-pay | Admitting: Nurse Practitioner

## 2021-12-28 ENCOUNTER — Other Ambulatory Visit: Payer: Self-pay

## 2021-12-28 DIAGNOSIS — M810 Age-related osteoporosis without current pathological fracture: Secondary | ICD-10-CM

## 2021-12-28 MED ORDER — DENOSUMAB 60 MG/ML ~~LOC~~ SOSY: 60.0000 mg | PREFILLED_SYRINGE | SUBCUTANEOUS | 1 refills | Status: DC

## 2021-12-28 NOTE — Telephone Encounter (Signed)
Spoke with patient who verbally understood insurance have approved Prolia injections. Patient aware that she will have a $25 co-pay at the time of the visit. Per insurance Prolia prescription has to be sent to pharmacy to be filled patient agrees to pick up from pharmacy and give [place] a call to schedule appointment when she receives prescription. Okay to send pending Rx to pharmacy?  Please advise

## 2022-01-03 ENCOUNTER — Other Ambulatory Visit: Payer: Self-pay

## 2022-01-03 NOTE — Telephone Encounter (Signed)
Prolia Rx sent to pharmacy to fill patient aware and will give [place] a call to schedule appointment for injection when she receives Rx.

## 2022-01-11 ENCOUNTER — Ambulatory Visit (INDEPENDENT_AMBULATORY_CARE_PROVIDER_SITE_OTHER): Payer: Federal, State, Local not specified - PPO

## 2022-01-11 DIAGNOSIS — M810 Age-related osteoporosis without current pathological fracture: Secondary | ICD-10-CM | POA: Diagnosis not present

## 2022-01-11 MED ORDER — DENOSUMAB 60 MG/ML ~~LOC~~ SOSY
60.0000 mg | PREFILLED_SYRINGE | Freq: Once | SUBCUTANEOUS | Status: AC
  Administered 2022-01-11: 60 mg via SUBCUTANEOUS

## 2022-01-11 NOTE — Progress Notes (Signed)
After obtaining consent, and per orders of [facility], injection of Prolia right arm given by Lynda Rainwater. Patient instructed to remain in clinic for 20 minutes afterwards, and to report any adverse reaction to me immediately.

## 2022-01-17 ENCOUNTER — Ambulatory Visit
Admission: RE | Admit: 2022-01-17 | Discharge: 2022-01-17 | Disposition: A | Discharge status: Home / Self Care | Payer: Federal, State, Local not specified - PPO | Source: Ambulatory Visit | Attending: Nurse Practitioner | Admitting: Nurse Practitioner

## 2022-01-17 DIAGNOSIS — Z1231 Encounter for screening mammogram for malignant neoplasm of breast: Secondary | ICD-10-CM

## 2022-02-04 ENCOUNTER — Other Ambulatory Visit: Payer: Self-pay | Admitting: Family Medicine

## 2022-02-05 ENCOUNTER — Ambulatory Visit: Payer: Federal, State, Local not specified - PPO | Admitting: Nurse Practitioner

## 2022-02-13 ENCOUNTER — Ambulatory Visit: Payer: Federal, State, Local not specified - PPO | Admitting: Nurse Practitioner

## 2022-02-13 ENCOUNTER — Telehealth: Payer: Self-pay | Admitting: Nurse Practitioner

## 2022-02-13 NOTE — Telephone Encounter (Signed)
Pt was a no show 7//06/2022 for an OV with Dr. Lorayne Marek, I have sent out a no show letter

## 2022-02-15 ENCOUNTER — Ambulatory Visit: Payer: Federal, State, Local not specified - PPO | Admitting: Nurse Practitioner

## 2022-02-15 ENCOUNTER — Encounter: Payer: Self-pay | Admitting: Nurse Practitioner

## 2022-02-15 VITALS — BP 112/72 | HR 79 | Temp 96.9°F | Ht 64.0 in | Wt 133.8 lb

## 2022-02-15 DIAGNOSIS — G8929 Other chronic pain: Secondary | ICD-10-CM

## 2022-02-15 DIAGNOSIS — E1165 Type 2 diabetes mellitus with hyperglycemia: Secondary | ICD-10-CM

## 2022-02-15 DIAGNOSIS — M25552 Pain in left hip: Secondary | ICD-10-CM | POA: Diagnosis not present

## 2022-02-15 DIAGNOSIS — M19031 Primary osteoarthritis, right wrist: Secondary | ICD-10-CM | POA: Diagnosis not present

## 2022-02-15 LAB — POCT GLYCOSYLATED HEMOGLOBIN (HGB A1C): Hemoglobin A1C: 6.5 % — AB (ref 4.0–5.6)

## 2022-02-15 NOTE — Progress Notes (Signed)
Established Patient Visit  Patient: Breanna Martinez   DOB: 1961/06/25   61 y.o. Female  MRN: [id number] Visit Date: 02/15/2022  Subjective:    Chief Complaint  Patient presents with   Office Visit    3 month F/u Checks blood sugars daily, has been WNL Right thumb pain x 2 months, only aches at night while she sleeps. No other concerns    Wrist Pain  The pain is present in the right wrist. This is a new problem. The current episode started more than 1 month ago. There has been no history of extremity trauma. The problem occurs constantly. The problem has been unchanged. The quality of the pain is described as dull and aching. Pertinent negatives include no fever, inability to bear weight, itching, joint locking, joint swelling, limited range of motion, numbness, stiffness or tingling. The symptoms are aggravated by activity. She has tried nothing for the symptoms. Family history does not include gout or rheumatoid arthritis. Her past medical history is significant for diabetes and osteoarthritis. There is no history of gout or rheumatoid arthritis.   DM (diabetes mellitus) ([place]) Fasting glucose:108 -120. Has made lifestyle modifications: low carb/low fat diet, and daily exercise. This has led to 9lbs weight loss.  repeat hgbA1c: 6.5%, improved with metformin 567m BID Denies any adverse side effects with current medication. ASCVD risk at 6.5%: no need for statin at this time. Maintain metformin dose Advised to increase daily protein and fiber F/up in 337month Chronic left hip pain Ongoing since 2022, worse at bedtime and in AM. No known injury eval by previous pcp and ortho Hip r-x-ray: bilateral OA. Hx of hip joint cortisone injection with some improvement. She did not complete outpatient PT as recommended  Agreed to start outpatient OT, use tylenol or NSAID OTC as needed.  Needs FMLA to come for medical appts.  Wt Readings from Last 3 Encounters:  02/15/22  133 lb 12.8 oz (60.7 kg)  11/06/21 142 lb 3.2 oz (64.5 kg)  08/10/21 143 lb (64.9 kg)     Reviewed medical, surgical, and social history today  Medications: Outpatient Medications Prior to Visit  Medication Sig   ACCU-CHEK GUIDE test strip CHECK BLOOD GLUCOSE ONCE DAILY   blood glucose meter kit and supplies KIT 1 each by Does not apply route daily as needed. [place] glucometer   Calcium Citrate (CITRACAL PO) Take by mouth.   denosumab (PROLIA) 60 MG/ML SOSY injection Inject 60 mg into the skin every 6 (six) months.   metFORMIN (GLUCOPHAGE) 500 MG tablet Take 1 tablet (500 mg total) by mouth 2 (two) times daily with a meal.   Multiple Vitamins-Minerals (MULTIVITAMIN ADULT EXTRA C PO) multivitamin  Take 1 tablet as directed by oral route   Vitamin D, Ergocalciferol, (DRISDOL) 1.25 MG (50000 UNIT) CAPS capsule Take 1 capsule (50,000 Units total) by mouth every 7 (seven) days.   [DISCONTINUED] blood glucose meter kit and supplies KIT Dispense based on patient and insurance preference. Check glucose once a day. E11.65   No facility-administered medications prior to visit.   Reviewed past medical and social history.   ROS per HPI above      Objective:  BP 112/72 (BP Location: Right Arm, Patient Position: Sitting, Cuff Size: Small)   Pulse 79   Temp (!) 96.9 F (36.1 C) (Temporal)   Ht _0  (1.626 m)   Wt 133 lb 12.8 oz (60.7  kg)   SpO2 99%   BMI 22.97 kg/m      Physical Exam Vitals and nursing note reviewed.  Cardiovascular:     Rate and Rhythm: Normal rate and regular rhythm.     Pulses: Normal pulses.     Heart sounds: Normal heart sounds.  Pulmonary:     Effort: Pulmonary effort is normal.     Breath sounds: Normal breath sounds.  Musculoskeletal:     Right elbow: Normal.     Left elbow: Normal.     Right forearm: Normal.     Left forearm: Normal.     Right wrist: Tenderness and snuff box tenderness present. No swelling, deformity, effusion, lacerations, bony  tenderness or crepitus. Normal range of motion. Normal pulse.     Left wrist: Normal.     Right hand: Tenderness present. No swelling, deformity or lacerations. Normal range of motion. Decreased strength of thumb/finger opposition and wrist extension. Normal strength of finger abduction. Normal pulse.     Left hand: Normal.     Lumbar back: Normal.     Right hip: Normal.     Left hip: Tenderness present. No crepitus. Normal range of motion. Normal strength.     Right upper leg: Normal.     Left upper leg: Normal.  Neurological:     Mental Status: She is alert.     Results for orders placed or performed in visit on 02/15/22  POCT glycosylated hemoglobin (Hb A1C)  Result Value Ref Range   Hemoglobin A1C 6.5 (A) 4.0 - 5.6 %      Assessment & Plan:    Problem List Items Addressed This Visit       Endocrine   DM (diabetes mellitus) ([place]) - Primary    Fasting glucose:108 -120. Has made lifestyle modifications: low carb/low fat diet, and daily exercise. This has led to 9lbs weight loss.  repeat hgbA1c: 6.5%, improved with metformin 570m BID Denies any adverse side effects with current medication. ASCVD risk at 6.5%: no need for statin at this time. Maintain metformin dose Advised to increase daily protein and fiber F/up in 377month     Relevant Orders   Ambulatory referral to Ophthalmology   POCT glycosylated hemoglobin (Hb A1C) (Completed)     Musculoskeletal and Integument   Localized primary osteoarthritis of carpometacarpal (CMC) joint of right wrist     Other   Chronic left hip pain    Ongoing since 2022, worse at bedtime and in AM. No known injury eval by previous pcp and ortho Hip r-x-ray: bilateral OA. Hx of hip joint cortisone injection with some improvement. She did not complete outpatient PT as recommended  Agreed to start outpatient OT, use tylenol or NSAID OTC as needed.      Relevant Orders   Ambulatory referral to Physical Therapy  hgbA1c at 6.5%:  improved. Keep up the good work Increase daily protein Maintain daily exercise and med dose. Use wrist brace with spica splint daily (24hrs x 1week, then only during the day x 1week) Use voltaren gel on wrist 3x/day Apply cold compress 2x/day in wrist.  You will be contacted to schedule appt with PT  Send FMLA form (complete to allow medical appt without penalty at work).  Return in about 3 months (around 05/18/2022) for DM.     ChWilfred LacyNP

## 2022-02-15 NOTE — Assessment & Plan Note (Signed)
Ongoing since 2022, worse at bedtime and in AM. No known injury eval by previous pcp and ortho Hip r-x-ray: bilateral OA. Hx of hip joint cortisone injection with some improvement. She did not complete outpatient PT as recommended  Agreed to start outpatient OT, use tylenol or NSAID OTC as needed.

## 2022-02-15 NOTE — Patient Instructions (Addendum)
hgbA1c at 6.5%: improved. Keep up the good work Increase daily protein Maintain daily exercise and med dose. Use wrist brace with spica splint daily (24hrs x 1week, then only during the day x 1week) Use voltaren gel on wrist 3x/day Apply cold compress 2x/day in wrist.  You will be contacted to schedule appt with PT  Send FMLA form (complete to allow medical appt without penalty at work).  DASH Eating Plan DASH stands for Dietary Approaches to Stop Hypertension. The DASH eating plan is a healthy eating plan that has been shown to: Reduce high blood pressure (hypertension). Reduce your risk for type 2 diabetes, heart disease, and stroke. Help with weight loss. What are tips for following this plan? Reading food labels Check food labels for the amount of salt (sodium) per serving. Choose foods with less than 5 percent of the Daily Value of sodium. Generally, foods with less than 300 milligrams (mg) of sodium per serving fit into this eating plan. To find whole grains, look for the word "whole" as the first word in the ingredient list. Shopping Buy products labeled as "low-sodium" or "no salt added." Buy fresh foods. Avoid canned foods and pre-made or frozen meals. Cooking Avoid adding salt when cooking. Use salt-free seasonings or herbs instead of table salt or sea salt. Check with your health care provider or pharmacist before using salt substitutes. Do not fry foods. Cook foods using healthy methods such as baking, boiling, grilling, roasting, and broiling instead. Cook with heart-healthy oils, such as olive, canola, avocado, soybean, or sunflower oil. Meal planning  Eat a balanced diet that includes: 4 or more servings of fruits and 4 or more servings of vegetables each day. Try to fill one-half of your plate with fruits and vegetables. 6-8 servings of whole grains each day. Less than 6 oz (170 g) of lean meat, poultry, or fish each day. A 3-oz (85-g) serving of meat is about the same  size as a deck of cards. One egg equals 1 oz (28 g). 2-3 servings of low-fat dairy each day. One serving is 1 cup (237 mL). 1 serving of nuts, seeds, or beans 5 times each week. 2-3 servings of heart-healthy fats. Healthy fats called omega-3 fatty acids are found in foods such as walnuts, flaxseeds, fortified milks, and eggs. These fats are also found in cold-water fish, such as sardines, salmon, and mackerel. Limit how much you eat of: Canned or prepackaged foods. Food that is high in trans fat, such as some fried foods. Food that is high in saturated fat, such as fatty meat. Desserts and other sweets, sugary drinks, and other foods with added sugar. Full-fat dairy products. Do not salt foods before eating. Do not eat more than 4 egg yolks a week. Try to eat at least 2 vegetarian meals a week. Eat more home-cooked food and less restaurant, buffet, and fast food. Lifestyle When eating at a restaurant, ask that your food be prepared with less salt or no salt, if possible. If you drink alcohol: Limit how much you use to: 0-1 drink a day for women who are not pregnant. 0-2 drinks a day for men. Be aware of how much alcohol is in your drink. In the [place] one drink equals one 12 oz bottle of beer (355 mL), one 5 oz glass of wine (148 mL), or one 1 oz glass of hard liquor (44 mL). General information Avoid eating more than 2,300 mg of salt a day. If you have hypertension, you may need  to reduce your sodium intake to 1,500 mg a day. Work with your health care provider to maintain a healthy body weight or to lose weight. Ask what an ideal weight is for you. Get at least 30 minutes of exercise that causes your heart to beat faster (aerobic exercise) most days of the week. Activities may include walking, swimming, or biking. Work with your health care provider or dietitian to adjust your eating plan to your individual calorie needs. What foods should I eat? Fruits All fresh, dried, or frozen  fruit. Canned fruit in natural juice (without added sugar). Vegetables Fresh or frozen vegetables (raw, steamed, roasted, or grilled). Low-sodium or reduced-sodium tomato and vegetable juice. Low-sodium or reduced-sodium tomato sauce and tomato paste. Low-sodium or reduced-sodium canned vegetables. Grains Whole-grain or whole-wheat bread. Whole-grain or whole-wheat pasta. Brown rice. [place]. Bulgur. Whole-grain and low-sodium cereals. Pita bread. Low-fat, low-sodium crackers. Whole-wheat flour tortillas. Meats and other proteins Skinless chicken or [place]. Ground chicken or [place]. Pork with fat trimmed off. Fish and seafood. Egg whites. Dried beans, peas, or lentils. Unsalted nuts, nut butters, and seeds. Unsalted canned beans. Lean cuts of beef with fat trimmed off. Low-sodium, lean precooked or cured meat, such as sausages or meat loaves. Dairy Low-fat (1%) or fat-free (skim) milk. Reduced-fat, low-fat, or fat-free cheeses. Nonfat, low-sodium ricotta or cottage cheese. Low-fat or nonfat yogurt. Low-fat, low-sodium cheese. Fats and oils Soft margarine without trans fats. Vegetable oil. Reduced-fat, low-fat, or light mayonnaise and salad dressings (reduced-sodium). Canola, safflower, olive, avocado, soybean, and sunflower oils. Avocado. Seasonings and condiments Herbs. Spices. Seasoning mixes without salt. Other foods Unsalted popcorn and pretzels. Fat-free sweets. The items listed above may not be a complete list of foods and beverages you can eat. Contact a dietitian for more information. What foods should I avoid? Fruits Canned fruit in a light or heavy syrup. Fried fruit. Fruit in cream or butter sauce. Vegetables Creamed or fried vegetables. Vegetables in a cheese sauce. Regular canned vegetables (not low-sodium or reduced-sodium). Regular canned tomato sauce and paste (not low-sodium or reduced-sodium). Regular tomato and vegetable juice (not low-sodium or reduced-sodium).  Angie Fava. Olives. Grains Baked goods made with fat, such as croissants, muffins, or some breads. Dry pasta or rice meal packs. Meats and other proteins Fatty cuts of meat. Ribs. Fried meat. Berniece Salines. Bologna, salami, and other precooked or cured meats, such as sausages or meat loaves. Fat from the back of a pig (fatback). Bratwurst. Salted nuts and seeds. Canned beans with added salt. Canned or smoked fish. Whole eggs or egg yolks. Chicken or [place] with skin. Dairy Whole or 2% milk, cream, and half-and-half. Whole or full-fat cream cheese. Whole-fat or sweetened yogurt. Full-fat cheese. Nondairy creamers. Whipped toppings. Processed cheese and cheese spreads. Fats and oils Butter. Stick margarine. Lard. Shortening. Ghee. Bacon fat. Tropical oils, such as coconut, palm kernel, or palm oil. Seasonings and condiments Onion salt, garlic salt, seasoned salt, table salt, and sea salt. Worcestershire sauce. Tartar sauce. Barbecue sauce. Teriyaki sauce. Soy sauce, including reduced-sodium. Steak sauce. Canned and packaged gravies. Fish sauce. Oyster sauce. Cocktail sauce. Store-bought horseradish. Ketchup. Mustard. Meat flavorings and tenderizers. Bouillon cubes. Hot sauces. Pre-made or packaged marinades. Pre-made or packaged taco seasonings. Relishes. Regular salad dressings. Other foods Salted popcorn and pretzels. The items listed above may not be a complete list of foods and beverages you should avoid. Contact a dietitian for more information. Where to find more information National Heart, Lung, and Blood Institute: https://wilson-eaton.com/ American Heart Association:  www.heart.org Academy of Nutrition and Dietetics: www.eatright.[place]: www.kidney.org Summary The DASH eating plan is a healthy eating plan that has been shown to reduce high blood pressure (hypertension). It may also reduce your risk for type 2 diabetes, heart disease, and stroke. When on the DASH eating plan, aim to  eat more fresh fruits and vegetables, whole grains, lean proteins, low-fat dairy, and heart-healthy fats. With the DASH eating plan, you should limit salt (sodium) intake to 2,300 mg a day. If you have hypertension, you may need to reduce your sodium intake to 1,500 mg a day. Work with your health care provider or dietitian to adjust your eating plan to your individual calorie needs. This information is not intended to replace advice given to you by your health care provider. Make sure you discuss any questions you have with your health care provider. Document Revised: 06/26/2019 Document Reviewed: 06/26/2019 Elsevier Patient Education  [place].

## 2022-02-15 NOTE — Assessment & Plan Note (Signed)
Fasting glucose:108 -120. Has made lifestyle modifications: low carb/low fat diet, and daily exercise. This has led to 9lbs weight loss.  repeat hgbA1c: 6.5%, improved with metformin '500mg'$  BID Denies any adverse side effects with current medication. ASCVD risk at 6.5%: no need for statin at this time. Maintain metformin dose Advised to increase daily protein and fiber F/up in 14month

## 2022-02-23 ENCOUNTER — Telehealth: Payer: Self-pay | Admitting: Nurse Practitioner

## 2022-02-23 NOTE — Telephone Encounter (Signed)
Pt dropped off documentation for FMLA

## 2022-02-26 NOTE — Telephone Encounter (Signed)
Forms have been received, Please allow 7-10 Business days to complete.

## 2022-03-07 ENCOUNTER — Ambulatory Visit: Payer: Federal, State, Local not specified - PPO | Attending: Nurse Practitioner | Admitting: Physical Therapy

## 2022-03-07 ENCOUNTER — Encounter: Payer: Self-pay | Admitting: Physical Therapy

## 2022-03-07 DIAGNOSIS — M5442 Lumbago with sciatica, left side: Secondary | ICD-10-CM | POA: Diagnosis not present

## 2022-03-07 DIAGNOSIS — G8929 Other chronic pain: Secondary | ICD-10-CM | POA: Insufficient documentation

## 2022-03-07 DIAGNOSIS — M25652 Stiffness of left hip, not elsewhere classified: Secondary | ICD-10-CM

## 2022-03-07 DIAGNOSIS — M25552 Pain in left hip: Secondary | ICD-10-CM | POA: Diagnosis not present

## 2022-03-07 DIAGNOSIS — R262 Difficulty in walking, not elsewhere classified: Secondary | ICD-10-CM | POA: Diagnosis not present

## 2022-03-07 DIAGNOSIS — M6281 Muscle weakness (generalized): Secondary | ICD-10-CM

## 2022-03-07 NOTE — Addendum Note (Signed)
Addended by: Sumner Boast on: 03/07/2022 11:44 AM   Modules accepted: Orders

## 2022-03-07 NOTE — Therapy (Signed)
OUTPATIENT PHYSICAL THERAPY LOWER EXTREMITY EVALUATION   Patient Name: Breanna Martinez MRN: [id number] DOB:08-25-1960, 61 y.o., female Today's Date: 03/07/2022   PT End of Session - 03/07/22 0928     Visit Number 1    Number of Visits 10    Date for PT Re-Evaluation 06/07/22    Authorization Type BCBS    PT Start Time 0928    PT Stop Time 8242    PT Time Calculation (min) 46 min    Activity Tolerance Patient tolerated treatment well    Behavior During Therapy [facility] for tasks assessed/performed             Past Medical History:  Diagnosis Date   Allergy    seasonal allergies   Osteoporosis    Past Surgical History:  Procedure Laterality Date   rotator cuff surgery     TOE SURGERY     Patient Active Problem List   Diagnosis Date Noted   Localized primary osteoarthritis of carpometacarpal ([place]) joint of right wrist 02/15/2022   Chronic left hip pain 02/15/2022   Vitamin D deficiency 11/06/2021   Toe pain, bilateral 11/06/2021   Osteoporosis 02/19/2016   DM (diabetes mellitus) ([place]) 10/15/2015    PCP: Lorayne Marek  REFERRING PROVIDER: [name]  REFERRING DIAG: Left hip pain  THERAPY DIAG:  Pain in left hip  Stiffness of left hip, not elsewhere classified  Left-sided low back pain with left-sided sciatica, unspecified chronicity  Muscle weakness (generalized)  Difficulty in walking, not elsewhere classified  Rationale for Evaluation and Treatment Rehabilitation  ONSET DATE: 03/06/2021  SUBJECTIVE:   SUBJECTIVE STATEMENT: Patient reports that about a year ago she started noticing pain in the left hip, she has had recent x-rays that showed some mild arthritis.  She reports that she works at the [employer] on [employer].  Reports some difficulty with walking and movements.    PERTINENT HISTORY: none  PAIN:  Are you having pain? Yes: NPRS scale: 2/10 Pain location: left hip laterally Pain description: ache Aggravating factors: moving the leg out, using the  left more, stairs pain up to 8/10 Relieving factors: rest, pain med can be 0/10  PRECAUTIONS: None  WEIGHT BEARING RESTRICTIONS No  FALLS:  Has patient fallen in last 6 months? No  LIVING ENVIRONMENT: Lives with: lives with their family Lives in: House/apartment Stairs: No Has following equipment at home: None  OCCUPATION: Tour manager, mostly on feet, a lot of walking  PLOF: Independent  does some housework  PATIENT GOALS have no hip pain   OBJECTIVE:   DIAGNOSTIC FINDINGS: x-rays negative  PATIENT SURVEYS:  FOTO 45  COGNITION:  Overall cognitive status: Within functional limits for tasks assessed     MUSCLE LENGTH: Hamstrings: Right 60 deg; Left 50 deg Left piriformis is very tight an painful, cannot cross the leg  POSTURE: rounded shoulders and forward head  PALPATION: Some tightness in the lumbar area, she is non tender in the back and the left hip  LOWER EXTREMITY ROM:  tight HS, has no ER at the hip, no extension due to pain and fear  LOWER EXTREMITY MMT:  Left hip 4/5 with mild pain  LOWER EXTREMITY SPECIAL TESTS:  Hip tests were positive due to pain and inability to get in most positions  GAIT: Distance walked: 50 feet Assistive device utilized: None Level of assistance: Complete Independence Comments: limp on the left, decreased hip movements  TODAY'S TREATMENT: See below   PATIENT EDUCATION:  Education details: see below  Person educated: Patient Education method: Explanation, Demonstration, Verbal cues, and Handouts Education comprehension: verbalized understanding   HOME EXERCISE PROGRAM: Access Code: [email or web address] URL: [email or web address] Date: 03/07/2022 Prepared by: Lum Babe  Exercises - Bent Knee Fallouts  - 2 x daily - 7 x weekly - 1 sets - 10 reps - 10 hold - Hip Flexor Stretch at Edge of Bed  - 2 x daily - 7 x weekly - 1 sets - 10 reps - 10 hold - Doorway Kneeling Hip Flexor Stretch  - 2 x daily - 7 x  weekly - 1 sets - 10 reps - 10 hold - Supine Piriformis Stretch Pulling Heel to Hip  - 2 x daily - 7 x weekly - 1 sets - 10 reps - 10 hold  ASSESSMENT:  CLINICAL IMPRESSION: Patient is a 61 y.o. female who was seen today for physical therapy evaluation and treatment for left hip pain, reports that she has had some pain for about a year, is unsure of a cause.  Her job has her on her feet on cement floors all day.  She has good strength but very poor motions at the hip, she is very scared to moved the hip has no abduction, extension and ER.  I feel that she started having pain and then stopped moving the leg.  She started some gym activities and reports it feels a little better with less pain overall but still minimal movements    OBJECTIVE IMPAIRMENTS Abnormal gait, decreased activity tolerance, decreased balance, decreased mobility, difficulty walking, decreased ROM, decreased strength, increased muscle spasms, impaired flexibility, and pain.   REHAB POTENTIAL: Good  CLINICAL DECISION MAKING: Stable/uncomplicated  EVALUATION COMPLEXITY: Low   GOALS: Goals reviewed with patient? Yes  SHORT TERM GOALS: Target date: 03/21/22 Independent with initial HEP Goal status: INITIAL   LONG TERM GOALS: Target date:05/30/22  Independent with advanced HEP Goal status: INITIAL  2.  Decrease pain 50% Goal status: INITIAL  3.  Put shoes and sock on by crossing the left leg over the right Goal status: INITIAL  4.  Increase hip ER to 10 degrees Goal status: INITIAL    PLAN: PT FREQUENCY: 1x/week  PT DURATION: 12 weeks  PLANNED INTERVENTIONS: Therapeutic exercises, Therapeutic activity, Neuromuscular re-education, Balance training, Gait training, Patient/Family education, Self Care, Joint mobilization, Dry Needling, Moist heat, Traction, and Manual therapy  PLAN FOR NEXT SESSION: go into the gym and look at the exercises for the LE's she could do, work on flexibility  lightly   Sumner Boast, PT 03/07/2022, 9:30 AM

## 2022-03-09 ENCOUNTER — Encounter: Payer: Self-pay | Admitting: Nurse Practitioner

## 2022-03-09 NOTE — Telephone Encounter (Signed)
1st no show, fee waived, letter sent 

## 2022-03-10 ENCOUNTER — Other Ambulatory Visit: Payer: Self-pay | Admitting: Nurse Practitioner

## 2022-03-10 DIAGNOSIS — E1165 Type 2 diabetes mellitus with hyperglycemia: Secondary | ICD-10-CM

## 2022-03-12 ENCOUNTER — Telehealth: Payer: Self-pay | Admitting: Nurse Practitioner

## 2022-03-12 NOTE — Telephone Encounter (Signed)
Forms received, please allow 7-10 business days to complete  

## 2022-03-12 NOTE — Telephone Encounter (Signed)
Type of forms received:FMLA   Routed IR:WERXV  Paperwork received by : terrill    Individual made aware of 3-5 business day turn around (Y/N):y  Form completed and patient made aware of charges(Y/N):y   Faxed to :   Form location:  nche box

## 2022-03-12 NOTE — Telephone Encounter (Signed)
Chart supports Rx Last OV: 02/2022 Next OV: Not scheduled

## 2022-03-15 ENCOUNTER — Encounter: Payer: Self-pay | Admitting: Physical Therapy

## 2022-03-15 ENCOUNTER — Ambulatory Visit: Payer: Federal, State, Local not specified - PPO | Admitting: Physical Therapy

## 2022-03-15 DIAGNOSIS — M25552 Pain in left hip: Secondary | ICD-10-CM | POA: Diagnosis not present

## 2022-03-15 DIAGNOSIS — M6281 Muscle weakness (generalized): Secondary | ICD-10-CM | POA: Diagnosis not present

## 2022-03-15 DIAGNOSIS — M5442 Lumbago with sciatica, left side: Secondary | ICD-10-CM | POA: Diagnosis not present

## 2022-03-15 DIAGNOSIS — R262 Difficulty in walking, not elsewhere classified: Secondary | ICD-10-CM | POA: Diagnosis not present

## 2022-03-15 DIAGNOSIS — M25652 Stiffness of left hip, not elsewhere classified: Secondary | ICD-10-CM | POA: Diagnosis not present

## 2022-03-15 DIAGNOSIS — G8929 Other chronic pain: Secondary | ICD-10-CM | POA: Diagnosis not present

## 2022-03-15 NOTE — Therapy (Signed)
OUTPATIENT PHYSICAL THERAPY LOWER EXTREMITY TREATMENT   Patient Name: Breanna Martinez MRN: [id number] DOB:July 04, 1961, 61 y.o., female Today's Date: 03/15/2022   PT End of Session - 03/15/22 1426     Visit Number 2    Date for PT Re-Evaluation 06/07/22    PT Start Time 1430    PT Stop Time 1515    PT Time Calculation (min) 45 min    Activity Tolerance Patient tolerated treatment well    Behavior During Therapy [facility] for tasks assessed/performed             Past Medical History:  Diagnosis Date   Allergy    seasonal allergies   Osteoporosis    Past Surgical History:  Procedure Laterality Date   rotator cuff surgery     TOE SURGERY     Patient Active Problem List   Diagnosis Date Noted   Localized primary osteoarthritis of carpometacarpal ([place]) joint of right wrist 02/15/2022   Chronic left hip pain 02/15/2022   Vitamin D deficiency 11/06/2021   Toe pain, bilateral 11/06/2021   Osteoporosis 02/19/2016   DM (diabetes mellitus) ([place]) 10/15/2015    PCP: Lorayne Marek  REFERRING PROVIDER: [name]  REFERRING DIAG: Left hip pain  THERAPY DIAG:  Pain in left hip  Stiffness of left hip, not elsewhere classified  Left-sided low back pain with left-sided sciatica, unspecified chronicity  Difficulty in walking, not elsewhere classified  Muscle weakness (generalized)  Rationale for Evaluation and Treatment Rehabilitation  ONSET DATE: 03/06/2021  SUBJECTIVE:   SUBJECTIVE STATEMENT: "Im good" pain with some of the exercises   PERTINENT HISTORY: none  PAIN:  Are you having pain? Yes: NPRS scale: 0/10 Pain location: left hip laterally Pain description: ache Aggravating factors: moving the leg out, using the left more, stairs pain up to 8/10 Relieving factors: rest, pain med can be 0/10  PRECAUTIONS: None  WEIGHT BEARING RESTRICTIONS No  FALLS:  Has patient fallen in last 6 months? No  LIVING ENVIRONMENT: Lives with: lives with their family Lives in:  House/apartment Stairs: No Has following equipment at home: None  OCCUPATION: Tour manager, mostly on feet, a lot of walking  PLOF: Independent  does some housework  PATIENT GOALS have no hip pain   OBJECTIVE:   MUSCLE LENGTH: Hamstrings: Right 60 deg; Left 50 deg Left piriformis is very tight an painful, cannot cross the leg  POSTURE: rounded shoulders and forward head  PALPATION: Some tightness in the lumbar area, she is non tender in the back and the left hip  LOWER EXTREMITY ROM:  tight HS, has no ER at the hip, no extension due to pain and fear  LOWER EXTREMITY MMT:  Left hip 4/5 with mild pain  LOWER EXTREMITY SPECIAL TESTS:  Hip tests were positive due to pain and inability to get in most positions  GAIT: Distance walked: 50 feet Assistive device utilized: None Level of assistance: Complete Independence Comments: limp on the left, decreased hip movements  TODAY'S TREATMENT: 03/15/22 NuStep L3 x 6 min  Recumbent bike L1 x 2 min  L hip PROM  S2S 2x10  Resisted side steps 20lb x5 each 6in step ups  Ball squeezes 2x10  Seated hip abd green 2x10    PATIENT EDUCATION:  Education details: see below Person educated: Patient Education method: Consulting civil engineer, Media planner, Verbal cues, and Handouts Education comprehension: verbalized understanding   HOME EXERCISE PROGRAM: Access Code: [email or web address] URL: [email or web address] Date: 03/07/2022 Prepared by: Lum Babe  Exercises - Bent Knee Fallouts  -  2 x daily - 7 x weekly - 1 sets - 10 reps - 10 hold - Hip Flexor Stretch at Edge of Bed  - 2 x daily - 7 x weekly - 1 sets - 10 reps - 10 hold - Doorway Kneeling Hip Flexor Stretch  - 2 x daily - 7 x weekly - 1 sets - 10 reps - 10 hold - Supine Piriformis Stretch Pulling Heel to Hip  - 2 x daily - 7 x weekly - 1 sets - 10 reps - 10 hold  ASSESSMENT:  CLINICAL IMPRESSION: Pt enters clinic feeling well. Pt able to complete an initial progression  to TE and stretching. Some hip weakness present with resisted side steps and step ups. She was very guarded with PROM with anticipation of pain with movement.    OBJECTIVE IMPAIRMENTS Abnormal gait, decreased activity tolerance, decreased balance, decreased mobility, difficulty walking, decreased ROM, decreased strength, increased muscle spasms, impaired flexibility, and pain.   REHAB POTENTIAL: Good  CLINICAL DECISION MAKING: Stable/uncomplicated  EVALUATION COMPLEXITY: Low   GOALS: Goals reviewed with patient? Yes  SHORT TERM GOALS: Target date: 03/21/22 Independent with initial HEP Goal status: INITIAL   LONG TERM GOALS: Target date:05/30/22  Independent with advanced HEP Goal status: INITIAL  2.  Decrease pain 50% Goal status: INITIAL  3.  Put shoes and sock on by crossing the left leg over the right Goal status: INITIAL  4.  Increase hip ER to 10 degrees Goal status: INITIAL    PLAN: PT FREQUENCY: 1x/week  PT DURATION: 12 weeks  PLANNED INTERVENTIONS: Therapeutic exercises, Therapeutic activity, Neuromuscular re-education, Balance training, Gait training, Patient/Family education, Self Care, Joint mobilization, Dry Needling, Moist heat, Traction, and Manual therapy  PLAN FOR NEXT SESSION: go into the gym and look at the exercises for the LE's she could do, work on flexibility lightly   Scot Jun, PTA 03/15/2022, 2:27 PM

## 2022-03-20 ENCOUNTER — Ambulatory Visit (INDEPENDENT_AMBULATORY_CARE_PROVIDER_SITE_OTHER): Payer: Federal, State, Local not specified - PPO | Admitting: Ophthalmology

## 2022-03-20 ENCOUNTER — Encounter (INDEPENDENT_AMBULATORY_CARE_PROVIDER_SITE_OTHER): Payer: Self-pay | Admitting: Ophthalmology

## 2022-03-20 DIAGNOSIS — E119 Type 2 diabetes mellitus without complications: Secondary | ICD-10-CM | POA: Diagnosis not present

## 2022-03-20 DIAGNOSIS — H2513 Age-related nuclear cataract, bilateral: Secondary | ICD-10-CM | POA: Diagnosis not present

## 2022-03-20 DIAGNOSIS — H43823 Vitreomacular adhesion, bilateral: Secondary | ICD-10-CM | POA: Diagnosis not present

## 2022-03-20 NOTE — Assessment & Plan Note (Signed)
Stable, physiologic OU

## 2022-03-20 NOTE — Progress Notes (Signed)
03/20/2022     CHIEF COMPLAINT Patient presents for  Chief Complaint  Patient presents with   Retina Evaluation      HISTORY OF PRESENT ILLNESS: Breanna Martinez is a 61 y.o. female who presents to the clinic today for:   HPI     Retina Evaluation           Laterality: both eyes   Associated Symptoms: Negative for Flashes, Floaters, Distortion, Blind Spot, Pain, Redness, Photophobia, Glare, Trauma, Scalp Tenderness, Jaw Claudication, Shoulder/Hip pain, Fever, Weight Loss and Fatigue         Comments   NP- Diabetic eye exam, ref by pcp , notes reviewed Pt stated, "I used to scratch my eyes all the time and I think my eyes are so dry at night. I sleep with a mask now to keep myself from rubbing my eyes in the middle of the night sometimes. But even the, I would wake up and my eyes would feel scratchy. I cant see without my glasses at all so I would always wear my glasses." Pt has been diagnosed with type 2 diabetes for a couple of years.       Last edited by Silvestre Moment on 03/20/2022  9:55 AM.      Referring physician: Flossie Buffy, NP [place],  [place] [id number]  HISTORICAL INFORMATION:   Selected notes from the MEDICAL RECORD NUMBER    Lab Results  Component Value Date   HGBA1C 6.5 (A) 02/15/2022     CURRENT MEDICATIONS: No current outpatient medications on file. (Ophthalmic Drugs)   No current facility-administered medications for this visit. (Ophthalmic Drugs)   Current Outpatient Medications (Other)  Medication Sig   ACCU-CHEK GUIDE test strip CHECK BLOOD GLUCOSE ONCE DAILY   blood glucose meter kit and supplies KIT 1 each by Does not apply route daily as needed. [place] glucometer   Calcium Citrate (CITRACAL PO) Take by mouth.   denosumab (PROLIA) 60 MG/ML SOSY injection Inject 60 mg into the skin every 6 (six) months.   metFORMIN (GLUCOPHAGE) 500 MG tablet TAKE 1 TABLET BY MOUTH 2 TIMES DAILY WITH A MEAL.   Multiple  Vitamins-Minerals (MULTIVITAMIN ADULT EXTRA C PO) multivitamin  Take 1 tablet as directed by oral route   Vitamin D, Ergocalciferol, (DRISDOL) 1.25 MG (50000 UNIT) CAPS capsule Take 1 capsule (50,000 Units total) by mouth every 7 (seven) days.   No current facility-administered medications for this visit. (Other)      REVIEW OF SYSTEMS: ROS   Negative for: Constitutional, Gastrointestinal, Neurological, Skin, Genitourinary, Musculoskeletal, HENT, Endocrine, Cardiovascular, Eyes, Respiratory, Psychiatric, Allergic/Imm, Heme/Lymph Last edited by Silvestre Moment on 03/20/2022  9:55 AM.       ALLERGIES Allergies  Allergen Reactions   Penicillins Anaphylaxis    PAST MEDICAL HISTORY Past Medical History:  Diagnosis Date   Allergy    seasonal allergies   Osteoporosis    Past Surgical History:  Procedure Laterality Date   rotator cuff surgery     TOE SURGERY      FAMILY HISTORY Family History  Problem Relation Age of Onset   Diabetes Mother    Glaucoma Mother    Prostate cancer Father    Colon cancer Neg Hx    Colon polyps Neg Hx    Esophageal cancer Neg Hx    Rectal cancer Neg Hx    Stomach cancer Neg Hx     SOCIAL HISTORY Social History   Tobacco Use  Smoking status: Never   Smokeless tobacco: Never  Vaping Use   Vaping Use: Never used  Substance Use Topics   Alcohol use: Yes    Comment: socially    Drug use: No         OPHTHALMIC EXAM:  Base Eye Exam     Visual Acuity (ETDRS)       Right Left   Dist cc 20/20 20/20    Correction: Glasses         Tonometry (Tonopen, 10:01 AM)       Right Left   Pressure 13 14         Pupils       Pupils Dark Light Shape React APD   Right PERRL 4 3 Round Slow None   Left PERRL 4 3 Round Slow None         Visual Fields       Left Right    Full Full         Extraocular Movement       Right Left    Full, Ortho Full, Ortho         Neuro/Psych     Oriented x3: Yes         Dilation      Both eyes: 1.0% Mydriacyl, 2.5% Phenylephrine @ 10:01 AM           Slit Lamp and Fundus Exam     External Exam       Right Left   External Normal Normal         Slit Lamp Exam       Right Left   Lids/Lashes Normal Normal   Conjunctiva/Sclera White and quiet White and quiet   Cornea Clear Clear   Anterior Chamber Deep and quiet Deep and quiet   Iris Round and reactive Round and reactive   Lens 1+ Nuclear sclerosis 1+ Nuclear sclerosis   Anterior Vitreous Normal Normal         Fundus Exam       Right Left   Posterior Vitreous Normal Normal   Disc Normal Normal   C/D Ratio 0.7 0.7   Macula Normal Normal   Vessels no DR no DR   Periphery Normal Normal            IMAGING AND PROCEDURES  Imaging and Procedures for 03/20/22  OCT, Retina - OU - Both Eyes       Right Eye Quality was good. Central Foveal Thickness: 297. Progression has no prior data. Findings include normal foveal contour, vitreomacular adhesion .   Left Eye Quality was good. Central Foveal Thickness: 281. Progression has no prior data. Findings include normal foveal contour, vitreomacular adhesion .      Color Fundus Photography Optos - OU - Both Eyes       Right Eye Disc findings include increased cup to disc ratio.   Left Eye Progression has no prior data. Disc findings include increased cup to disc ratio.   Notes NO Detectable diabetic retinopathy with clear media OU              ASSESSMENT/PLAN:  Diabetes mellitus without complication (HCC) Right currently with blood sugar control continue to prevent progression    Vitreomacular adhesion of both eyes Stable, physiologic OU  Nuclear sclerotic cataract of both eyes Normal for age, observe not physiologic significant     ICD-10-CM   1. Vitreomacular adhesion of both eyes  H43.823 OCT, Retina - OU - Both  Eyes    Color Fundus Photography Optos - OU - Both Eyes    2. Diabetes mellitus without complication  ([place])  L38.1 Color Fundus Photography Optos - OU - Both Eyes    3. Nuclear sclerotic cataract of both eyes  H25.13       1.  No detectable diabetic retinopathy  2.  Visually insignificant [place] changes OU.  3.  Physiologic VMA observe  Ophthalmic Meds Ordered this visit:  No orders of the defined types were placed in this encounter.      Return in about 1 year (around 03/21/2023) for DILATE OU, COLOR FP, OCT.  Patient Instructions  The nature of diabetic retinopathy was explained using the following analogy: "Retinopathy develops in the body's blood supply like salty water corrodes the linings of pipes in a house, until rust appears, then holes in the pipes develop which leak followed by destruction and loss of the pipes as the corrosion turns them to dust. In a similar fashion, Diabetes damages the blood supply of the body by cumulative long--term elevated blood sugar, which corrodes the blood supply in the body, particularly the blood vessels supplying the retina, kidneys, and nerves".  Thus, control of blood sugar, slows the progression of the corrosive effect of diabetes mellitus.      Explained the diagnoses, plan, and follow up with the patient and they expressed understanding.  Patient expressed understanding of the importance of proper follow up care.   Clent Demark Rhema Boyett M.D. Diseases & Surgery of the Retina and Vitreous [facility]ina [facility] Diabetic [place] 03/20/22     Abbreviations: M myopia (nearsighted); A astigmatism; H hyperopia (farsighted); P presbyopia; Mrx spectacle prescription;  CTL contact lenses; OD right eye; OS left eye; OU both eyes  XT exotropia; ET esotropia; PEK punctate epithelial keratitis; PEE punctate epithelial erosions; DES dry eye syndrome; MGD meibomian gland dysfunction; ATs artificial tears; PFAT's preservative free artificial tears; [place] nuclear sclerotic cataract; PSC posterior subcapsular cataract; ERM epi-retinal membrane; PVD posterior vitreous  detachment; RD retinal detachment; DM diabetes mellitus; DR diabetic retinopathy; NPDR non-proliferative diabetic retinopathy; PDR proliferative diabetic retinopathy; CSME clinically significant macular edema; DME diabetic macular edema; dbh dot blot hemorrhages; CWS cotton wool spot; POAG primary open angle glaucoma; C/D cup-to-disc ratio; HVF humphrey visual field; GVF goldmann visual field; OCT optical coherence tomography; IOP intraocular pressure; BRVO Branch retinal vein occlusion; CRVO central retinal vein occlusion; CRAO central retinal artery occlusion; BRAO branch retinal artery occlusion; RT retinal tear; SB scleral buckle; PPV pars plana vitrectomy; VH Vitreous hemorrhage; PRP panretinal laser photocoagulation; IVK intravitreal kenalog; VMT vitreomacular traction; MH Macular hole;  NVD neovascularization of the disc; NVE neovascularization elsewhere; AREDS age related eye disease study; ARMD age related macular degeneration; POAG primary open angle glaucoma; EBMD epithelial/anterior basement membrane dystrophy; ACIOL anterior chamber intraocular lens; IOL intraocular lens; PCIOL posterior chamber intraocular lens; Phaco/IOL phacoemulsification with intraocular lens placement; [place] photorefractive keratectomy; LASIK laser assisted in situ keratomileusis; HTN hypertension; DM diabetes mellitus; COPD chronic obstructive pulmonary disease

## 2022-03-20 NOTE — Assessment & Plan Note (Signed)
Right currently with blood sugar control continue to prevent progression

## 2022-03-20 NOTE — Patient Instructions (Signed)
The nature of diabetic retinopathy was explained using the following analogy: "Retinopathy develops in the body's blood supply like salty water corrodes the linings of pipes in a house, until rust appears, then holes in the pipes develop which leak followed by destruction and loss of the pipes as the corrosion turns them to dust. In a similar fashion, Diabetes damages the blood supply of the body by cumulative long--term elevated blood sugar, which corrodes the blood supply in the body, particularly the blood vessels supplying the retina, kidneys, and nerves".  Thus, control of blood sugar, slows the progression of the corrosive effect of diabetes mellitus.

## 2022-03-20 NOTE — Assessment & Plan Note (Signed)
Normal for age, observe not physiologic significant

## 2022-03-21 ENCOUNTER — Encounter: Payer: Self-pay | Admitting: Physical Therapy

## 2022-03-21 ENCOUNTER — Ambulatory Visit: Payer: Federal, State, Local not specified - PPO | Admitting: Physical Therapy

## 2022-03-21 DIAGNOSIS — M25652 Stiffness of left hip, not elsewhere classified: Secondary | ICD-10-CM

## 2022-03-21 DIAGNOSIS — M5442 Lumbago with sciatica, left side: Secondary | ICD-10-CM | POA: Diagnosis not present

## 2022-03-21 DIAGNOSIS — M25552 Pain in left hip: Secondary | ICD-10-CM | POA: Diagnosis not present

## 2022-03-21 DIAGNOSIS — G8929 Other chronic pain: Secondary | ICD-10-CM | POA: Diagnosis not present

## 2022-03-21 DIAGNOSIS — M6281 Muscle weakness (generalized): Secondary | ICD-10-CM | POA: Diagnosis not present

## 2022-03-21 DIAGNOSIS — R262 Difficulty in walking, not elsewhere classified: Secondary | ICD-10-CM

## 2022-03-21 NOTE — Therapy (Signed)
OUTPATIENT PHYSICAL THERAPY LOWER EXTREMITY TREATMENT   Patient Name: Breanna Martinez MRN: [id number] DOB:[birth year], 61 y.o., female Today's Date: 03/21/2022   PT End of Session - 03/21/22 1103     Visit Number 3    Date for PT Re-Evaluation 06/07/22    PT Start Time 1103    PT Stop Time 1145    PT Time Calculation (min) 42 min    Activity Tolerance Patient tolerated treatment well    Behavior During Therapy [facility] for tasks assessed/performed             Past Medical History:  Diagnosis Date   Allergy    seasonal allergies   Osteoporosis    Past Surgical History:  Procedure Laterality Date   rotator cuff surgery     TOE SURGERY     Patient Active Problem List   Diagnosis Date Noted   Vitreomacular adhesion of both eyes 03/20/2022   Nuclear sclerotic cataract of both eyes 03/20/2022   Localized primary osteoarthritis of carpometacarpal ([place]) joint of right wrist 02/15/2022   Chronic left hip pain 02/15/2022   Vitamin D deficiency 11/06/2021   Toe pain, bilateral 11/06/2021   Osteoporosis 02/19/2016   Diabetes mellitus without complication ([place]) 05/30/8526    PCP: Lorayne Marek  REFERRING PROVIDER: [name]  REFERRING DIAG: Left hip pain  THERAPY DIAG:  Pain in left hip  Stiffness of left hip, not elsewhere classified  Left-sided low back pain with left-sided sciatica, unspecified chronicity  Difficulty in walking, not elsewhere classified  Muscle weakness (generalized)  Rationale for Evaluation and Treatment Rehabilitation  ONSET DATE: 03/06/2021  SUBJECTIVE:   SUBJECTIVE STATEMENT: "I am doing good" Felt fine after last session  PERTINENT HISTORY: none  PAIN:  Are you having pain? Yes: NPRS scale: 0/10 Pain location: left hip laterally Pain description: ache Aggravating factors: moving the leg out, using the left more, stairs pain up to 8/10 Relieving factors: rest, pain med can be 0/10  PRECAUTIONS: None  WEIGHT BEARING RESTRICTIONS No  FALLS:   Has patient fallen in last 6 months? No  LIVING ENVIRONMENT: Lives with: lives with their family Lives in: House/apartment Stairs: No Has following equipment at home: None  OCCUPATION: Tour manager, mostly on feet, a lot of walking  PLOF: Independent  does some housework  PATIENT GOALS have no hip pain   OBJECTIVE:   MUSCLE LENGTH: Hamstrings: Right 60 deg; Left 50 deg Left piriformis is very tight an painful, cannot cross the leg  POSTURE: rounded shoulders and forward head  PALPATION: Some tightness in the lumbar area, she is non tender in the back and the left hip  LOWER EXTREMITY ROM:  tight HS, has no ER at the hip, no extension due to pain and fear  LOWER EXTREMITY MMT:  Left hip 4/5 with mild pain  LOWER EXTREMITY SPECIAL TESTS:  Hip tests were positive due to pain and inability to get in most positions  GAIT: Distance walked: 50 feet Assistive device utilized: None Level of assistance: Complete Independence Comments: limp on the left, decreased hip movements  TODAY'S TREATMENT: 03/21/22 Recumbent bike L3 x 6 min   Leg press 30lb 3x10  Leg curls 25lb  Leg Ext 5lb 2x10  Lateral step ups x10 each Ball squeezes 2x10  L hip PROM in all directions   03/15/22 NuStep L3 x 6 min  Recumbent bike L1 x 2 min  L hip PROM  S2S 2x10  Resisted side steps 20lb x5 each 6in step ups  Ball squeezes 2x10  Seated hip abd green 2x10    PATIENT EDUCATION:  Education details: see below Person educated: Patient Education method: Consulting civil engineer, Media planner, Verbal cues, and Handouts Education comprehension: verbalized understanding   HOME EXERCISE PROGRAM: Access Code: [email or web address] URL: [email or web address] Date: 03/07/2022 Prepared by: Lum Babe  Exercises - Bent Knee Fallouts  - 2 x daily - 7 x weekly - 1 sets - 10 reps - 10 hold - Hip Flexor Stretch at Edge of Bed  - 2 x daily - 7 x weekly - 1 sets - 10 reps - 10 hold - Doorway Kneeling  Hip Flexor Stretch  - 2 x daily - 7 x weekly - 1 sets - 10 reps - 10 hold - Supine Piriformis Stretch Pulling Heel to Hip  - 2 x daily - 7 x weekly - 1 sets - 10 reps - 10 hold  ASSESSMENT:  CLINICAL IMPRESSION: Pt enters clinic feeling well, reporting no issues after last session. Some hip weakness present with lateral step ups. She again guarded with PROM with anticipation of pain with movement. Cues needed to control the eccentric phase on machine level interventions. Pt has very tight and limited L hip ER.   OBJECTIVE IMPAIRMENTS Abnormal gait, decreased activity tolerance, decreased balance, decreased mobility, difficulty walking, decreased ROM, decreased strength, increased muscle spasms, impaired flexibility, and pain.   REHAB POTENTIAL: Good  CLINICAL DECISION MAKING: Stable/uncomplicated  EVALUATION COMPLEXITY: Low   GOALS: Goals reviewed with patient? Yes  SHORT TERM GOALS: Target date: 03/21/22 Independent with initial HEP Goal status: Met   LONG TERM GOALS: Target date:05/30/22  Independent with advanced HEP Goal status: INITIAL  2.  Decrease pain 50% Goal status: progressing  3.  Put shoes and sock on by crossing the left leg over the right Goal status: INITIAL  4.  Increase hip ER to 10 degrees Goal status: INITIAL    PLAN: PT FREQUENCY: 1x/week  PT DURATION: 12 weeks  PLANNED INTERVENTIONS: Therapeutic exercises, Therapeutic activity, Neuromuscular re-education, Balance training, Gait training, Patient/Family education, Self Care, Joint mobilization, Dry Needling, Moist heat, Traction, and Manual therapy  PLAN FOR NEXT SESSION: go into the gym and look at the exercises for the LE's she could do, work on flexibility lightly   Scot Jun, PTA 03/21/2022, 11:04 AM

## 2022-03-28 ENCOUNTER — Ambulatory Visit: Payer: Federal, State, Local not specified - PPO | Admitting: Physical Therapy

## 2022-03-28 ENCOUNTER — Encounter: Payer: Self-pay | Admitting: Physical Therapy

## 2022-03-28 DIAGNOSIS — R262 Difficulty in walking, not elsewhere classified: Secondary | ICD-10-CM | POA: Diagnosis not present

## 2022-03-28 DIAGNOSIS — M5442 Lumbago with sciatica, left side: Secondary | ICD-10-CM | POA: Diagnosis not present

## 2022-03-28 DIAGNOSIS — M25552 Pain in left hip: Secondary | ICD-10-CM | POA: Diagnosis not present

## 2022-03-28 DIAGNOSIS — M6281 Muscle weakness (generalized): Secondary | ICD-10-CM | POA: Diagnosis not present

## 2022-03-28 DIAGNOSIS — G8929 Other chronic pain: Secondary | ICD-10-CM | POA: Diagnosis not present

## 2022-03-28 DIAGNOSIS — M25652 Stiffness of left hip, not elsewhere classified: Secondary | ICD-10-CM | POA: Diagnosis not present

## 2022-03-28 NOTE — Therapy (Signed)
OUTPATIENT PHYSICAL THERAPY LOWER EXTREMITY TREATMENT   Patient Name: Breanna Martinez MRN: [id number] DOB:[birth year], 60 y.o., female Today's Date: 03/28/2022   PT End of Session - 03/28/22 1146     Visit Number 4    Number of Visits 49    Date for PT Re-Evaluation 06/07/22    Authorization Type [employer]    PT Start Time 1102    PT Stop Time 1143    PT Time Calculation (min) 41 min    Activity Tolerance Patient tolerated treatment well    Behavior During Therapy WFL for tasks assessed/performed              Past Medical History:  Diagnosis Date   Allergy    seasonal allergies   Osteoporosis    Past Surgical History:  Procedure Laterality Date   rotator cuff surgery     TOE SURGERY     Patient Active Problem List   Diagnosis Date Noted   Vitreomacular adhesion of both eyes 03/20/2022   Nuclear sclerotic cataract of both eyes 03/20/2022   Localized primary osteoarthritis of carpometacarpal (CMC) joint of right wrist 02/15/2022   Chronic left hip pain 02/15/2022   Vitamin D deficiency 11/06/2021   Toe pain, bilateral 11/06/2021   Osteoporosis 02/19/2016   Diabetes mellitus without complication ([place]) 55/73/2202    PCP: Lorayne Marek  REFERRING PROVIDER: [name]  REFERRING DIAG: Left hip pain  THERAPY DIAG:  Pain in left hip  Stiffness of left hip, not elsewhere classified  Left-sided low back pain with left-sided sciatica, unspecified chronicity  Difficulty in walking, not elsewhere classified  Muscle weakness (generalized)  Rationale for Evaluation and Treatment Rehabilitation  ONSET DATE: 03/06/2021  SUBJECTIVE:   SUBJECTIVE STATEMENT: Feeling like I am getting better, PT is helping. Sometimes it is hard for me to do the movements with my hip, I can do pretty much what I need to do.  PERTINENT HISTORY: none  PAIN:  Are you having pain? Yes: NPRS scale: 0/10 Pain location: left hip laterally Pain description: ache Aggravating factors: moving the leg  out, using the left more, stairs pain up to 8/10 Relieving factors: rest, pain med can be 0/10  PRECAUTIONS: None  WEIGHT BEARING RESTRICTIONS No  FALLS:  Has patient fallen in last 6 months? No  LIVING ENVIRONMENT: Lives with: lives with their family Lives in: House/apartment Stairs: No Has following equipment at home: None  OCCUPATION: Tour manager, mostly on feet, a lot of walking  PLOF: Independent  does some housework  PATIENT GOALS have no hip pain   OBJECTIVE:   MUSCLE LENGTH: Hamstrings: Right 60 deg; Left 50 deg Left piriformis is very tight an painful, cannot cross the leg  POSTURE: rounded shoulders and forward head  PALPATION: Some tightness in the lumbar area, she is non tender in the back and the left hip  LOWER EXTREMITY ROM:  tight HS, has no ER at the hip, no extension due to pain and fear  LOWER EXTREMITY MMT:  Left hip 4/5 with mild pain  LOWER EXTREMITY SPECIAL TESTS:  Hip tests were positive due to pain and inability to get in most positions  GAIT: Distance walked: 50 feet Assistive device utilized: None Level of assistance: Complete Independence Comments: limp on the left, decreased hip movements  TODAY'S TREATMENT:  03/28/22  TherEx:  Constance Haw with hip ABD red TB 1x15  Sidelying hip ABD red TB 1x10 B Prone hip extensions red TB 1x10 B Walking bridges x10  L hip PROM  all directions  Piriformis and HS stretches 2x30 seconds B  Hip hikes with and without swing 2x10 B    03/21/22 Recumbent bike L3 x 6 min   Leg press 30lb 3x10  Leg curls 25lb  Leg Ext 5lb 2x10  Lateral step ups x10 each Ball squeezes 2x10  L hip PROM in all directions   03/15/22 NuStep L3 x 6 min  Recumbent bike L1 x 2 min  L hip PROM  S2S 2x10  Resisted side steps 20lb x5 each 6in step ups  Ball squeezes 2x10  Seated hip abd green 2x10    PATIENT EDUCATION:  Education details: see below Person educated: Patient Education method: Consulting civil engineer,  Media planner, Verbal cues, and Handouts Education comprehension: verbalized understanding   HOME EXERCISE PROGRAM: Access Code: [email or web address] URL: [email or web address] Date: 03/07/2022 Prepared by: Lum Babe  Exercises - Bent Knee Fallouts  - 2 x daily - 7 x weekly - 1 sets - 10 reps - 10 hold - Hip Flexor Stretch at Edge of Bed  - 2 x daily - 7 x weekly - 1 sets - 10 reps - 10 hold - Doorway Kneeling Hip Flexor Stretch  - 2 x daily - 7 x weekly - 1 sets - 10 reps - 10 hold - Supine Piriformis Stretch Pulling Heel to Hip  - 2 x daily - 7 x weekly - 1 sets - 10 reps - 10 hold  ASSESSMENT:  CLINICAL IMPRESSION:  Vlasta arrives today doing OK, hip has been feeling much better with therapy so far. Spent some time progressing proximal strength, also working on hip ROM and flexibility and closed chain work as much as possible.as time allowed. Proximal mm groups still really weak- needed a lot of time to complete reps against resistance band with good form today.  Doing well and making progress overall, will continue efforts.    OBJECTIVE IMPAIRMENTS Abnormal gait, decreased activity tolerance, decreased balance, decreased mobility, difficulty walking, decreased ROM, decreased strength, increased muscle spasms, impaired flexibility, and pain.   REHAB POTENTIAL: Good  CLINICAL DECISION MAKING: Stable/uncomplicated  EVALUATION COMPLEXITY: Low   GOALS: Goals reviewed with patient? Yes  SHORT TERM GOALS: Target date: 03/21/22 Independent with initial HEP Goal status: Met   LONG TERM GOALS: Target date:05/30/22  Independent with advanced HEP Goal status: INITIAL  2.  Decrease pain 50% Goal status: progressing  3.  Put shoes and sock on by crossing the left leg over the right Goal status: INITIAL  4.  Increase hip ER to 10 degrees Goal status: INITIAL    PLAN: PT FREQUENCY: 1x/week  PT DURATION: 12 weeks  PLANNED INTERVENTIONS: Therapeutic exercises,  Therapeutic activity, Neuromuscular re-education, Balance training, Gait training, Patient/Family education, Self Care, Joint mobilization, Dry Needling, Moist heat, Traction, and Manual therapy  PLAN FOR NEXT SESSION: go into the gym and look at the exercises for the LE's she could do, work on flexibility lightly   Cyril Mourning U PT DPT PN2  03/28/2022, 11:47 AM

## 2022-04-04 ENCOUNTER — Ambulatory Visit: Payer: Federal, State, Local not specified - PPO | Admitting: Physical Therapy

## 2022-04-04 ENCOUNTER — Encounter: Payer: Self-pay | Admitting: Physical Therapy

## 2022-04-04 DIAGNOSIS — M5442 Lumbago with sciatica, left side: Secondary | ICD-10-CM | POA: Diagnosis not present

## 2022-04-04 DIAGNOSIS — M6281 Muscle weakness (generalized): Secondary | ICD-10-CM | POA: Diagnosis not present

## 2022-04-04 DIAGNOSIS — M25652 Stiffness of left hip, not elsewhere classified: Secondary | ICD-10-CM | POA: Diagnosis not present

## 2022-04-04 DIAGNOSIS — R262 Difficulty in walking, not elsewhere classified: Secondary | ICD-10-CM | POA: Diagnosis not present

## 2022-04-04 DIAGNOSIS — G8929 Other chronic pain: Secondary | ICD-10-CM | POA: Diagnosis not present

## 2022-04-04 DIAGNOSIS — M25552 Pain in left hip: Secondary | ICD-10-CM | POA: Diagnosis not present

## 2022-04-04 NOTE — Therapy (Signed)
OUTPATIENT PHYSICAL THERAPY LOWER EXTREMITY TREATMENT   Patient Name: Breanna Martinez MRN: [id number] DOB:10-27-1960, 61 y.o., female Today's Date: 04/04/2022   PT End of Session - 04/04/22 1146     Visit Number 5    Date for PT Re-Evaluation 06/07/22    PT Start Time 1145    PT Stop Time 1230    PT Time Calculation (min) 45 min    Activity Tolerance Patient tolerated treatment well    Behavior During Therapy [facility] for tasks assessed/performed              Past Medical History:  Diagnosis Date   Allergy    seasonal allergies   Osteoporosis    Past Surgical History:  Procedure Laterality Date   rotator cuff surgery     TOE SURGERY     Patient Active Problem List   Diagnosis Date Noted   Vitreomacular adhesion of both eyes 03/20/2022   Nuclear sclerotic cataract of both eyes 03/20/2022   Localized primary osteoarthritis of carpometacarpal (CMC) joint of right wrist 02/15/2022   Chronic left hip pain 02/15/2022   Vitamin D deficiency 11/06/2021   Toe pain, bilateral 11/06/2021   Osteoporosis 02/19/2016   Diabetes mellitus without complication ([place]) 77/06/6578    PCP: Lorayne Marek  REFERRING PROVIDER: [name]  REFERRING DIAG: Left hip pain  THERAPY DIAG:  Pain in left hip  Stiffness of left hip, not elsewhere classified  Left-sided low back pain with left-sided sciatica, unspecified chronicity  Difficulty in walking, not elsewhere classified  Muscle weakness (generalized)  Rationale for Evaluation and Treatment Rehabilitation  ONSET DATE: 03/06/2021  SUBJECTIVE:   SUBJECTIVE STATEMENT: "Im feeling fine" Has been going to the gym  PERTINENT HISTORY: none  PAIN:  Are you having pain? Yes: NPRS scale: 0/10 Pain location: left hip laterally Pain description: ache Aggravating factors: moving the leg out, using the left more, stairs pain up to 8/10 Relieving factors: rest, pain med can be 0/10  PRECAUTIONS: None  WEIGHT BEARING RESTRICTIONS No  FALLS:   Has patient fallen in last 6 months? No  LIVING ENVIRONMENT: Lives with: lives with their family Lives in: House/apartment Stairs: No Has following equipment at home: None  OCCUPATION: Tour manager, mostly on feet, a lot of walking  PLOF: Independent  does some housework  PATIENT GOALS have no hip pain   OBJECTIVE:   MUSCLE LENGTH: Hamstrings: Right 60 deg; Left 50 deg Left piriformis is very tight an painful, cannot cross the leg  POSTURE: rounded shoulders and forward head  PALPATION: Some tightness in the lumbar area, she is non tender in the back and the left hip  LOWER EXTREMITY ROM:  tight HS, has no ER at the hip, no extension due to pain and fear  LOWER EXTREMITY MMT:  Left hip 4/5 with mild pain  LOWER EXTREMITY SPECIAL TESTS:  Hip tests were positive due to pain and inability to get in most positions  GAIT: Distance walked: 50 feet Assistive device utilized: None Level of assistance: Complete Independence Comments: limp on the left, decreased hip movements  TODAY'S TREATMENT: 04/04/24 NuStep L5 x6 min  Resisted side steps 30lb 2x10 Hamstring curls 25lb 2x10 Step ups 6in 2x10 each S2S holding yellow ball 2x10 Bilateral LE stretches piriformis, HS, ITB  03/28/22  TherEx:  Bridges with hip ABD red TB 1x15  Sidelying hip ABD red TB 1x10 B Prone hip extensions red TB 1x10 B Walking bridges x10  L hip PROM all directions  Piriformis and  HS stretches 2x30 seconds B  Hip hikes with and without swing 2x10 B    03/21/22 Recumbent bike L3 x 6 min   Leg press 30lb 3x10  Leg curls 25lb  Leg Ext 5lb 2x10  Lateral step ups x10 each Ball squeezes 2x10  L hip PROM in all directions   03/15/22 NuStep L3 x 6 min  Recumbent bike L1 x 2 min  L hip PROM  S2S 2x10  Resisted side steps 20lb x5 each 6in step ups  Ball squeezes 2x10  Seated hip abd green 2x10    PATIENT EDUCATION:  Education details: see below Person educated: Patient Education  method: Consulting civil engineer, Media planner, Verbal cues, and Handouts Education comprehension: verbalized understanding   HOME EXERCISE PROGRAM: Access Code: [email or web address] URL: [email or web address] Date: 03/07/2022 Prepared by: Lum Babe  Exercises - Bent Knee Fallouts  - 2 x daily - 7 x weekly - 1 sets - 10 reps - 10 hold - Hip Flexor Stretch at Edge of Bed  - 2 x daily - 7 x weekly - 1 sets - 10 reps - 10 hold - Doorway Kneeling Hip Flexor Stretch  - 2 x daily - 7 x weekly - 1 sets - 10 reps - 10 hold - Supine Piriformis Stretch Pulling Heel to Hip  - 2 x daily - 7 x weekly - 1 sets - 10 reps - 10 hold  ASSESSMENT:  CLINICAL IMPRESSION:  Eathel arrives today doing OK, hip has been feeling much better with therapy so far. Session focused on more functional interventions. Some hip weakness present with step ups. Cue not to allow LE's to push against table with sit to stands. L hip remains really tight more so with piriformis.   OBJECTIVE IMPAIRMENTS Abnormal gait, decreased activity tolerance, decreased balance, decreased mobility, difficulty walking, decreased ROM, decreased strength, increased muscle spasms, impaired flexibility, and pain.   REHAB POTENTIAL: Good  CLINICAL DECISION MAKING: Stable/uncomplicated  EVALUATION COMPLEXITY: Low   GOALS: Goals reviewed with patient? Yes  SHORT TERM GOALS: Target date: 03/21/22 Independent with initial HEP Goal status: Met   LONG TERM GOALS: Target date:05/30/22  Independent with advanced HEP Goal status: INITIAL  2.  Decrease pain 50% Goal status: progressing  3.  Put shoes and sock on by crossing the left leg over the right Goal status: INITIAL  4.  Increase hip ER to 10 degrees Goal status: INITIAL    PLAN: PT FREQUENCY: 1x/week  PT DURATION: 12 weeks  PLANNED INTERVENTIONS: Therapeutic exercises, Therapeutic activity, Neuromuscular re-education, Balance training, Gait training, Patient/Family  education, Self Care, Joint mobilization, Dry Needling, Moist heat, Traction, and Manual therapy  PLAN FOR NEXT SESSION: go into the gym and look at the exercises for the LE's she could do, work on flexibility lightly   Cyril Mourning U PT DPT PN2  04/04/2022, 11:47 AM

## 2022-04-11 ENCOUNTER — Ambulatory Visit: Payer: Federal, State, Local not specified - PPO | Admitting: Physical Therapy

## 2022-04-18 ENCOUNTER — Encounter: Payer: Self-pay | Admitting: Physical Therapy

## 2022-04-18 ENCOUNTER — Ambulatory Visit: Payer: Federal, State, Local not specified - PPO | Attending: Nurse Practitioner | Admitting: Physical Therapy

## 2022-04-18 DIAGNOSIS — M25552 Pain in left hip: Secondary | ICD-10-CM | POA: Diagnosis not present

## 2022-04-18 DIAGNOSIS — M25652 Stiffness of left hip, not elsewhere classified: Secondary | ICD-10-CM

## 2022-04-18 DIAGNOSIS — M6281 Muscle weakness (generalized): Secondary | ICD-10-CM | POA: Diagnosis not present

## 2022-04-18 DIAGNOSIS — R262 Difficulty in walking, not elsewhere classified: Secondary | ICD-10-CM

## 2022-04-18 DIAGNOSIS — M5442 Lumbago with sciatica, left side: Secondary | ICD-10-CM

## 2022-04-18 NOTE — Therapy (Signed)
OUTPATIENT PHYSICAL THERAPY LOWER EXTREMITY TREATMENT   Patient Name: Breanna Martinez MRN: [id number] DOB:1961-04-10, 61 y.o., female Today's Date: 04/18/2022   PT End of Session - 04/18/22 1153     Visit Number 6    Date for PT Re-Evaluation 06/07/22    PT Start Time 1153    PT Stop Time 1230    PT Time Calculation (min) 37 min    Activity Tolerance Patient tolerated treatment well    Behavior During Therapy [facility] for tasks assessed/performed              Past Medical History:  Diagnosis Date   Allergy    seasonal allergies   Osteoporosis    Past Surgical History:  Procedure Laterality Date   rotator cuff surgery     TOE SURGERY     Patient Active Problem List   Diagnosis Date Noted   Vitreomacular adhesion of both eyes 03/20/2022   Nuclear sclerotic cataract of both eyes 03/20/2022   Localized primary osteoarthritis of carpometacarpal (CMC) joint of right wrist 02/15/2022   Chronic left hip pain 02/15/2022   Vitamin D deficiency 11/06/2021   Toe pain, bilateral 11/06/2021   Osteoporosis 02/19/2016   Diabetes mellitus without complication ([place]) 38/17/7116    PCP: Lorayne Marek  REFERRING PROVIDER: [name]  REFERRING DIAG: Left hip pain  THERAPY DIAG:  Pain in left hip  Stiffness of left hip, not elsewhere classified  Left-sided low back pain with left-sided sciatica, unspecified chronicity  Difficulty in walking, not elsewhere classified  Muscle weakness (generalized)  Rationale for Evaluation and Treatment Rehabilitation  ONSET DATE: 03/06/2021  SUBJECTIVE:   SUBJECTIVE STATEMENT: "Im feeling fine" Has been going to the gym  PERTINENT HISTORY: none  PAIN:  Are you having pain? Yes: NPRS scale: 0/10 Pain location: left hip laterally Pain description: ache Aggravating factors: moving the leg out, using the left more, stairs pain up to 8/10 Relieving factors: rest, pain med can be 0/10  PRECAUTIONS: None  WEIGHT BEARING RESTRICTIONS No  FALLS:   Has patient fallen in last 6 months? No  LIVING ENVIRONMENT: Lives with: lives with their family Lives in: House/apartment Stairs: No Has following equipment at home: None  OCCUPATION: Tour manager, mostly on feet, a lot of walking  PLOF: Independent  does some housework  PATIENT GOALS have no hip pain   OBJECTIVE:   MUSCLE LENGTH: Hamstrings: Right 60 deg; Left 50 deg Left piriformis is very tight an painful, cannot cross the leg  POSTURE: rounded shoulders and forward head  PALPATION: Some tightness in the lumbar area, she is non tender in the back and the left hip  LOWER EXTREMITY ROM:  tight HS, has no ER at the hip, no extension due to pain and fear  LOWER EXTREMITY MMT:  Left hip 4/5 with mild pain  LOWER EXTREMITY SPECIAL TESTS:  Hip tests were positive due to pain and inability to get in most positions  GAIT: Distance walked: 50 feet Assistive device utilized: None Level of assistance: Complete Independence Comments: limp on the left, decreased hip movements  TODAY'S TREATMENT: 04/18/22 Recumbent bike L4 x 6 min  S2S w/ yellow ball 2x12 Leg curls 25lb 2x10  Leg ext10lb 2x10 Forward & Lateral 8 in step ups x 10 each  04/04/24 NuStep L5 x6 min  Resisted side steps 30lb 2x10 Hamstring curls 25lb 2x10 Step ups 6in 2x10 each S2S holding yellow ball 2x10 Bilateral LE stretches piriformis, HS, ITB  03/28/22 Bridges with hip ABD red  TB 1x15  Sidelying hip ABD red TB 1x10 B Prone hip extensions red TB 1x10 B Walking bridges x10  L hip PROM all directions  Piriformis and HS stretches 2x30 seconds B  Hip hikes with and without swing 2x10 B  PATIENT EDUCATION:  Education details: see below Person educated: Patient Education method: Consulting civil engineer, Demonstration, Verbal cues, and Handouts Education comprehension: verbalized understanding   HOME EXERCISE PROGRAM: Access Code: [email or web address] URL: [email or web address] Date: 03/07/2022 Prepared  by: Lum Babe  Exercises - Bent Knee Fallouts  - 2 x daily - 7 x weekly - 1 sets - 10 reps - 10 hold - Hip Flexor Stretch at Edge of Bed  - 2 x daily - 7 x weekly - 1 sets - 10 reps - 10 hold - Doorway Kneeling Hip Flexor Stretch  - 2 x daily - 7 x weekly - 1 sets - 10 reps - 10 hold - Supine Piriformis Stretch Pulling Heel to Hip  - 2 x daily - 7 x weekly - 1 sets - 10 reps - 10 hold  ASSESSMENT:  CLINICAL IMPRESSION:  Rim arrives today doing OK, she had progressed meeting all goals and reports no functional limitations. She is pleased with her current functional status and reports she will continue to go to the gym.  OBJECTIVE IMPAIRMENTS Abnormal gait, decreased activity tolerance, decreased balance, decreased mobility, difficulty walking, decreased ROM, decreased strength, increased muscle spasms, impaired flexibility, and pain.   REHAB POTENTIAL: Good  CLINICAL DECISION MAKING: Stable/uncomplicated  EVALUATION COMPLEXITY: Low   GOALS: Goals reviewed with patient? Yes  SHORT TERM GOALS: Target date: 03/21/22 Independent with initial HEP Goal status: Met   LONG TERM GOALS: Target date:05/30/22  Independent with advanced HEP Goal status: Met 04/18/22  2.  Decrease pain 50% Goal status: Met 04/18/22  3.  Put shoes and sock on by crossing the left leg over the right Goal status: Met 04/18/22  4.  Increase hip ER to 10 degrees Goal status: Met 04/18/22    PLAN: PT FREQUENCY: 1x/week  PT DURATION: 12 weeks  PLANNED INTERVENTIONS: Therapeutic exercises, Therapeutic activity, Neuromuscular re-education, Balance training, Gait training, Patient/Family education, Self Care, Joint mobilization, Dry Needling, Moist heat, Traction, and Manual therapy  PLAN FOR NEXT SESSION: go into the gym and look at the exercises for the LE's she could do, work on flexibility lightly   PHYSICAL THERAPY DISCHARGE SUMMARY  Visits from Start of Care: 6  Patient agrees to  discharge. Patient goals were met. Patient is being discharged due to meeting the stated rehab goals.   Cheri Fowler PTA 04/18/2022, 11:53 AM

## 2022-04-20 ENCOUNTER — Encounter: Payer: Self-pay | Admitting: Nurse Practitioner

## 2022-04-20 ENCOUNTER — Ambulatory Visit: Payer: Federal, State, Local not specified - PPO | Admitting: Nurse Practitioner

## 2022-04-20 VITALS — BP 118/70 | HR 74 | Temp 97.0°F | Ht 64.0 in | Wt 133.2 lb

## 2022-04-20 DIAGNOSIS — S46811A Strain of other muscles, fascia and tendons at shoulder and upper arm level, right arm, initial encounter: Secondary | ICD-10-CM | POA: Diagnosis not present

## 2022-04-20 NOTE — Patient Instructions (Signed)
Start shoulder and neck muscle exercise once a day. Alternate between warm and cold compress. Ok to use voltaren gel or biofreeze gel as directed on tube. Call office if no improvement in 2weeks.  Shoulder Range of Motion Exercises Shoulder range of motion (ROM) exercises are done to keep the shoulder moving freely or to increase movement. They are recommended for people who have shoulder pain or stiffness or who are recovering from a shoulder surgery. Ask your health care provider which exercises are safe for you. Do exercises exactly as told by your health care provider and adjust them as directed. It is normal to feel mild stretching, pulling, tightness, or discomfort as you do these exercises. Stop right away if you feel sudden pain or your pain gets worse. Do not begin these exercises until told by your health care provider. Phase 1 exercise When you are able, do this exercise 1-2 times a day for 30-60 seconds in each direction, or as directed by your health care provider. Pendulum exercise     To do this exercise while sitting: Sit in a chair or at the edge of your bed with your feet flat on the floor. Let your affected arm hang down in front of you over the edge of the bed or chair. Relax your shoulder, arm, and hand. Rock your body so your arm gently swings in small circles. You can also use your unaffected arm to start the motion. Repeat, changing the direction of the circles, swinging your arm left and right, and swinging your arm forward and back. To do this exercise while standing: Stand next to a sturdy chair or table, and hold on to it with your hand on your unaffected side. Bend forward at the waist. Bend your knees slightly. Relax your shoulder, arm, and hand. While keeping your shoulder relaxed, use body motion to swing your arm in small circles. Repeat, changing the direction of the circles, swinging your arm left and right, and swinging your arm forward and back. Between  exercises, stand up tall and take a short break to relax your lower back.  Phase 2 exercises Do these exercises 1-2 times a day or as told by your health care provider. Hold each stretch for 30 seconds, and repeat 3 times. Do the exercises with one or both arms as instructed by your health care provider. For these exercises, sit at a table with your hand and arm supported by the table. A chair that slides easily or has wheels can be helpful. External rotation  Turn your chair so that your affected side is nearest to the table. Place your forearm on the table to your side. Bend your arm to about a 90-degree angle (right angle) at the elbow, and place your hand palm-down on the table. Your elbow should be about 6 inches (15 cm) away from your side. Keeping your arm on the table, lean your body forward. Abduction  Turn your chair so that your affected side is nearest to the table. Place your forearm and hand on the table so that your thumb points toward the ceiling and your arm is straight out to your side. Slide your hand out to the side and away from you. To increase the stretch, you can slide your chair away from the table. Flexion: forward stretch  Sit facing the table. Place your hand and elbow on the table in front of you. Slide your hand forward and away from you, using your unaffected arm to do the work.  To increase the stretch, you can slide your chair backward. Phase 3 exercises Do these exercises 1-2 times a day or as told by your health care provider. Hold each stretch for 30 seconds, and repeat 3 times. Do the exercises with one or both arms as instructed by your health care provider. You will need a cane, a piece of PVC pipe, or a sturdy wooden dowel for the wand exercises. Cross-body stretch: posterior capsule stretch  Lift your arm straight out in front of you. Bend your arm in a 90-degree angle (right angle) at the elbow so your forearm moves across your body. Use your other  arm to gently pull the elbow across your body, toward your other shoulder. Wall climbs  Stand with your affected arm extended out to the side with your hand resting on a door frame. Slide your hand slowly up the door frame. To increase the stretch, step through the door frame. Keep your body upright and do not lean. Flexion     To do this exercise while standing: Hold the wand with both of your hands, palms-down. Lift the wand up and over your head, if able. Lift mostly with your affected arm, and use the other arm to help. Push upward with your other arm to gently increase the stretch. To do this exercise while lying down: Lie on your back with your elbows resting on the floor and the wand in both your hands. Your hands will be palm-down, or pointing toward your feet. Lift your hands toward the ceiling, using your unaffected arm to help if needed. Bring your arms overhead as able, using your unaffected arm to help if needed.  Internal rotation  Stand while holding the wand behind you with both hands. Your unaffected arm should be extended above your head with the arm of the affected side extended behind you at the level of your waist. The wand should be pointing straight up and down as you hold it. Slowly pull the wand up behind your back by straightening the elbow of your unaffected arm and bending the elbow of your affected arm. External rotation  Lie on your back with your affected upper arm supported on a small pillow or rolled towel. When you first do this exercise, keep your upper arm close to your body. Over time, bring your arm up to a 90-degree angle (right angle) out to the side. Hold the wand across your stomach and with both hands palm-up. Your elbow on your affected side should be bent at a 90-degree angle. Use your unaffected side to help push your forearm away from you and toward the floor. Keep your elbow on your affected side bent at a 90-degree angle. This information  is not intended to replace advice given to you by your health care provider. Make sure you discuss any questions you have with your health care provider. Document Revised: 09/12/2021 Document Reviewed: 09/12/2021 Elsevier Patient Education  [employer].

## 2022-04-20 NOTE — Progress Notes (Signed)
Established Patient Visit  Patient: Breanna Martinez   DOB: Mar 15, 1961   61 y.o. Female  MRN: [id number] Visit Date: 04/20/2022  Subjective:    Chief Complaint  Patient presents with   Acute Visit    C/o Rt shoulder & back pain    Shoulder Pain  The pain is present in the right shoulder. This is a new problem. The current episode started in the past 7 days. There has been no history of extremity trauma. The problem occurs constantly. The problem has been unchanged. The quality of the pain is described as aching. The pain is moderate. Associated symptoms include stiffness. Pertinent negatives include no fever, inability to bear weight, itching, joint locking, joint swelling, limited range of motion, numbness or tingling. The symptoms are aggravated by activity. She has tried rest for the symptoms. The treatment provided mild relief. Family history does not include gout or rheumatoid arthritis. Her past medical history is significant for diabetes and osteoarthritis. There is no history of gout or rheumatoid arthritis.  S/p right rotator cuff surgery  Reviewed medical, surgical, and social history today  Medications: Outpatient Medications Prior to Visit  Medication Sig   ACCU-CHEK GUIDE test strip CHECK BLOOD GLUCOSE ONCE DAILY   Calcium Citrate (CITRACAL PO) Take by mouth.   denosumab (PROLIA) 60 MG/ML SOSY injection Inject 60 mg into the skin every 6 (six) months.   metFORMIN (GLUCOPHAGE) 500 MG tablet TAKE 1 TABLET BY MOUTH 2 TIMES DAILY WITH A MEAL.   Multiple Vitamins-Minerals (MULTIVITAMIN ADULT EXTRA C PO) multivitamin  Take 1 tablet as directed by oral route   Vitamin D, Ergocalciferol, (DRISDOL) 1.25 MG (50000 UNIT) CAPS capsule Take 1 capsule (50,000 Units total) by mouth every 7 (seven) days.   [DISCONTINUED] blood glucose meter kit and supplies KIT 1 each by Does not apply route daily as needed. Livongo glucometer   No facility-administered medications prior  to visit.   Reviewed past medical and social history.   ROS per HPI above      Objective:  BP 118/70 (BP Location: Right Arm, Patient Position: Sitting, Cuff Size: Normal)   Pulse 74   Temp (!) 97 F (36.1 C) (Temporal)   Ht 5' 4"  (1.626 m)   Wt 133 lb 3.2 oz (60.4 kg)   SpO2 97%   BMI 22.86 kg/m      Physical Exam Musculoskeletal:     Right shoulder: Tenderness present. No swelling, effusion, bony tenderness or crepitus. Decreased range of motion. Normal strength. Normal pulse.     Left shoulder: Normal.     Right upper arm: Normal.     Left upper arm: Normal.     Right elbow: Normal.     Left elbow: Normal.     Cervical back: Normal range of motion and neck supple. No rigidity or tenderness.     Comments: Limited external rotation. Normal internal rotation. Positive Neer's test.  Lymphadenopathy:     Cervical: No cervical adenopathy.  Neurological:     Mental Status: She is alert.     No results found for any visits on 04/20/22.    Assessment & Plan:    Problem List Items Addressed This Visit   None Visit Diagnoses     Supraspinatus sprain, right, initial encounter    -  Primary     Start shoulder and neck muscle exercise once a day. Alternate between warm and cold  compress. Ok to use voltaren gel or biofreeze gel as directed on tube. Call office if no improvement in 2weeks.  Return if symptoms worsen or fail to improve.     Wilfred Lacy, NP

## 2022-04-25 ENCOUNTER — Ambulatory Visit: Payer: Federal, State, Local not specified - PPO | Admitting: Physical Therapy

## 2022-05-02 ENCOUNTER — Ambulatory Visit: Payer: Federal, State, Local not specified - PPO | Admitting: Physical Therapy

## 2022-05-29 DIAGNOSIS — R634 Abnormal weight loss: Secondary | ICD-10-CM | POA: Diagnosis not present

## 2022-05-29 DIAGNOSIS — E119 Type 2 diabetes mellitus without complications: Secondary | ICD-10-CM | POA: Diagnosis not present

## 2022-05-29 DIAGNOSIS — M810 Age-related osteoporosis without current pathological fracture: Secondary | ICD-10-CM | POA: Diagnosis not present

## 2022-05-29 DIAGNOSIS — Z01419 Encounter for gynecological examination (general) (routine) without abnormal findings: Secondary | ICD-10-CM | POA: Diagnosis not present

## 2022-05-31 ENCOUNTER — Telehealth (INDEPENDENT_AMBULATORY_CARE_PROVIDER_SITE_OTHER): Payer: Federal, State, Local not specified - PPO | Admitting: Nurse Practitioner

## 2022-05-31 ENCOUNTER — Encounter: Payer: Self-pay | Admitting: Nurse Practitioner

## 2022-05-31 DIAGNOSIS — E1165 Type 2 diabetes mellitus with hyperglycemia: Secondary | ICD-10-CM

## 2022-05-31 MED ORDER — METFORMIN HCL 500 MG PO TABS
500.0000 mg | ORAL_TABLET | Freq: Every day | ORAL | 1 refills | Status: DC
Start: 2022-05-31 — End: 2022-12-17

## 2022-05-31 NOTE — Progress Notes (Signed)
Virtual Visit via Video Note  I connected withNAME@ on 05/31/22 at  8:00 AM EDT by a video enabled telemedicine application and verified that I am speaking with the correct person using two identifiers.  Location: Patient:Home Provider: Office Participants: patient and provider  I discussed the limitations of evaluation and management by telemedicine and the availability of in person appointments. I also discussed with the patient that there may be a patient responsible charge related to this service. The patient expressed understanding and agreed to proceed.  VF:[id number] side effect?  History of Present Illness:  DM (diabetes mellitus) ([place]) Noticed 10lbs weight loss in last 54month due to lifestyle modifications (low fat/low carb) and use of metformin. Last hgbA1c at 6.5% Denies any GI symptoms Wt Readings from Last 3 Encounters:  05/31/22 132 lb (59.9 kg)  04/20/22 133 lb 3.2 oz (60.4 kg)  02/15/22 133 lb 12.8 oz (60.7 kg)   Advised to decrease metformin to 5075m daily F/up in 31m731monthWt Readings from Last 3 Encounters:  05/31/22 132 lb (59.9 kg)  04/20/22 133 lb 3.2 oz (60.4 kg)  02/15/22 133 lb 12.8 oz (60.7 kg)    Observations/Objective: Alert and oriented x 4  Assessment and Plan: FloAshleas seen today for follow-up.  Diagnoses and all orders for this visit:  Type 2 diabetes mellitus with hyperglycemia, without long-term current use of insulin (HCC) -     metFORMIN (GLUCOPHAGE) 500 MG tablet; Take 1 tablet (500 mg total) by mouth daily with breakfast.   Follow Up Instructions: See instructions above   I discussed the assessment and treatment plan with the patient. The patient was provided an opportunity to ask questions and all were answered. The patient agreed with the plan and demonstrated an understanding of the instructions.   The patient was advised to call back or seek an in-person evaluation if the symptoms worsen or if the condition fails to improve  as anticipated.  ChaWilfred LacyP

## 2022-05-31 NOTE — Assessment & Plan Note (Signed)
Noticed 10lbs weight loss in last 69month due to lifestyle modifications (low fat/low carb) and use of metformin. Last hgbA1c at 6.5% Denies any GI symptoms Wt Readings from Last 3 Encounters:  05/31/22 132 lb (59.9 kg)  04/20/22 133 lb 3.2 oz (60.4 kg)  02/15/22 133 lb 12.8 oz (60.7 kg)   Advised to decrease metformin to 5039m daily F/up in 46m76month

## 2022-06-01 ENCOUNTER — Ambulatory Visit: Payer: Federal, State, Local not specified - PPO | Admitting: Family Medicine

## 2022-06-01 ENCOUNTER — Encounter: Payer: Self-pay | Admitting: Family Medicine

## 2022-06-01 VITALS — BP 110/68 | HR 71 | Temp 97.1°F | Ht 64.0 in | Wt 134.2 lb

## 2022-06-01 DIAGNOSIS — K921 Melena: Secondary | ICD-10-CM | POA: Diagnosis not present

## 2022-06-01 NOTE — Progress Notes (Signed)
Established Patient Office Visit  Subjective   Patient ID: Breanna Martinez, female    DOB: 1961/03/15  Age: 61 y.o. MRN: [id number]  Chief Complaint  Patient presents with   Rectal Bleeding    Bloody stool today some discomfort, normal stool mixed with blood.     Rectal Bleeding  Pertinent negatives include no abdominal pain, no diarrhea, no nausea, no vomiting and no rash.   with a 1 day history of hematochezia.  There was blood with wiping.  Patient denies hard painful stools or constipation.  She consumes a lot of fruits and vegetables.  There was no abdominal pain nausea vomiting or indigestion.  No fevers or chills.  Recent colonoscopy in 2022 showed 1 small polyp.  Results were reviewed.  There is been some weight loss with metformin for diabetes.  She denies night sweats but does experience hot flashes.    Review of Systems  Constitutional: Negative.   HENT: Negative.    Eyes:  Negative for blurred vision, discharge and redness.  Respiratory: Negative.    Cardiovascular: Negative.   Gastrointestinal:  Positive for blood in stool and hematochezia. Negative for abdominal pain, constipation, diarrhea, heartburn, melena, nausea and vomiting.  Genitourinary: Negative.   Musculoskeletal: Negative.  Negative for myalgias.  Skin:  Negative for rash.  Neurological:  Negative for tingling, loss of consciousness and weakness.  Endo/Heme/Allergies:  Negative for polydipsia.      Objective:     BP 110/68 (BP Location: Left Arm, Patient Position: Sitting, Cuff Size: Normal)   Pulse 71   Temp (!) 97.1 F (36.2 C) (Temporal)   Ht '5\' 4"'$  (1.626 m)   Wt 134 lb 3.2 oz (60.9 kg)   SpO2 98%   BMI 23.04 kg/m    Physical Exam Exam conducted with a chaperone present.  Constitutional:      General: She is not in acute distress.    Appearance: Normal appearance. She is not ill-appearing, toxic-appearing or diaphoretic.  HENT:     Head: Normocephalic and atraumatic.     Right  Ear: External ear normal.     Left Ear: External ear normal.     Mouth/Throat:     Mouth: Mucous membranes are moist.     Pharynx: Oropharynx is clear. No oropharyngeal exudate or posterior oropharyngeal erythema.  Eyes:     General: No scleral icterus.       Right eye: No discharge.        Left eye: No discharge.     Extraocular Movements: Extraocular movements intact.     Conjunctiva/sclera: Conjunctivae normal.     Pupils: Pupils are equal, round, and reactive to light.  Cardiovascular:     Rate and Rhythm: Normal rate and regular rhythm.  Pulmonary:     Effort: Pulmonary effort is normal. No respiratory distress.     Breath sounds: Normal breath sounds.  Abdominal:     General: Bowel sounds are normal. There is no distension.     Tenderness: There is no abdominal tenderness. There is no right CVA tenderness, left CVA tenderness, guarding or rebound.  Genitourinary:   Musculoskeletal:     Cervical back: No rigidity or tenderness.  Skin:    General: Skin is warm and dry.  Neurological:     Mental Status: She is alert and oriented to person, place, and time.  Psychiatric:        Mood and Affect: Mood normal.        Behavior: Behavior normal.  No results found for any visits on 06/01/22.    The 10-year ASCVD risk score (Arnett DK, et al., 2019) is: 6.9%    Assessment & Plan:   Problem List Items Addressed This Visit       Other   Hematochezia - Primary   Relevant Orders   CBC   Comprehensive metabolic panel    No follow-ups on file.  Suspect possible AVM.  Without melena, abdominal pain indigestion believe that this is a lower GI bleed.  Recent essentially normal colonoscopy is reassuring.  Reassured patient.  Will monitor over the weekend.  Checking CBC with CMP.  Referral to GI if persists.   Libby Maw, MD

## 2022-06-02 LAB — COMPREHENSIVE METABOLIC PANEL
ALT: 17 IU/L (ref 0–32)
AST: 22 IU/L (ref 0–40)
Albumin/Globulin Ratio: 1.6 (ref 1.2–2.2)
Albumin: 4.3 g/dL (ref 3.9–4.9)
Alkaline Phosphatase: 50 IU/L (ref 44–121)
BUN/Creatinine Ratio: 30 — ABNORMAL HIGH (ref 12–28)
BUN: 16 mg/dL (ref 8–27)
Bilirubin Total: 0.3 mg/dL (ref 0.0–1.2)
CO2: 22 mmol/L (ref 20–29)
Calcium: 9.7 mg/dL (ref 8.7–10.3)
Chloride: 103 mmol/L (ref 96–106)
Creatinine, Ser: 0.53 mg/dL — ABNORMAL LOW (ref 0.57–1.00)
Globulin, Total: 2.7 g/dL (ref 1.5–4.5)
Glucose: 116 mg/dL — ABNORMAL HIGH (ref 70–99)
Potassium: 3.7 mmol/L (ref 3.5–5.2)
Sodium: 139 mmol/L (ref 134–144)
Total Protein: 7 g/dL (ref 6.0–8.5)
eGFR: 105 mL/min/{1.73_m2} (ref >59)

## 2022-06-02 LAB — CBC
Hematocrit: 35.8 % (ref 34.0–46.6)
Hemoglobin: 11.6 g/dL (ref 11.1–15.9)
MCH: 27.2 pg (ref 26.6–33.0)
MCHC: 32.4 g/dL (ref 31.5–35.7)
MCV: 84 fL (ref 79–97)
Platelets: 314 10*3/uL (ref 150–450)
RBC: 4.26 x10E6/uL (ref 3.77–5.28)
RDW: 13.2 % (ref 11.7–15.4)
WBC: 6.2 10*3/uL (ref 3.4–10.8)

## 2022-06-04 ENCOUNTER — Telehealth: Payer: Self-pay | Admitting: Nurse Practitioner

## 2022-06-04 DIAGNOSIS — K921 Melena: Secondary | ICD-10-CM

## 2022-06-04 NOTE — Telephone Encounter (Signed)
Pt was seen by Dr. Ethelene Hal on 06/01/22 for blood in stool. She was told a referral would be put in if this persisted. She is wanting a referral placed for a GI doc. Please advise pt @ [phone]

## 2022-07-04 ENCOUNTER — Other Ambulatory Visit: Payer: Self-pay

## 2022-07-23 ENCOUNTER — Ambulatory Visit: Payer: Federal, State, Local not specified - PPO | Admitting: Nurse Practitioner

## 2022-08-03 ENCOUNTER — Telehealth: Payer: Self-pay

## 2022-08-03 NOTE — Telephone Encounter (Signed)
Patient called into the answering service today.  Returned call to patient. She states that she will be out of town next week and needs to cancel 08/08/22 appt with Dr. Henrene Pastor. Pt will call back when she is ready to reschedule. Pt had no concerns at the end of the call.

## 2022-08-08 ENCOUNTER — Ambulatory Visit: Payer: Federal, State, Local not specified - PPO | Admitting: Internal Medicine

## 2022-08-13 ENCOUNTER — Ambulatory Visit (INDEPENDENT_AMBULATORY_CARE_PROVIDER_SITE_OTHER): Payer: Federal, State, Local not specified - PPO | Admitting: Nurse Practitioner

## 2022-08-13 ENCOUNTER — Encounter: Payer: Self-pay | Admitting: Nurse Practitioner

## 2022-08-13 VITALS — BP 118/78 | HR 71 | Temp 96.6°F | Ht 64.0 in | Wt 137.4 lb

## 2022-08-13 DIAGNOSIS — Z1152 Encounter for screening for COVID-19: Secondary | ICD-10-CM | POA: Diagnosis not present

## 2022-08-13 DIAGNOSIS — E119 Type 2 diabetes mellitus without complications: Secondary | ICD-10-CM

## 2022-08-13 DIAGNOSIS — Z9189 Other specified personal risk factors, not elsewhere classified: Secondary | ICD-10-CM | POA: Diagnosis not present

## 2022-08-13 DIAGNOSIS — E1165 Type 2 diabetes mellitus with hyperglycemia: Secondary | ICD-10-CM

## 2022-08-13 LAB — POC COVID19 BINAXNOW: SARS Coronavirus 2 Ag: NEGATIVE

## 2022-08-13 LAB — RENAL FUNCTION PANEL
Albumin: 4.2 g/dL (ref 3.5–5.2)
BUN: 13 mg/dL (ref 6–23)
CO2: 28 mEq/L (ref 19–32)
Calcium: 9.3 mg/dL (ref 8.4–10.5)
Chloride: 105 mEq/L (ref 96–112)
Creatinine, Ser: 0.54 mg/dL (ref 0.40–1.20)
GFR: 99.34 mL/min (ref >60.00)
Glucose, Bld: 105 mg/dL — ABNORMAL HIGH (ref 70–99)
Phosphorus: 3.2 mg/dL (ref 2.3–4.6)
Potassium: 3.7 mEq/L (ref 3.5–5.1)
Sodium: 141 mEq/L (ref 135–145)

## 2022-08-13 LAB — MICROALBUMIN / CREATININE URINE RATIO
Creatinine,U: 93.1 mg/dL
Microalb Creat Ratio: 0.8 mg/g (ref 0.0–30.0)
Microalb, Ur: <0.7 mg/dL (ref 0.0–1.9)

## 2022-08-13 NOTE — Progress Notes (Signed)
                Established Patient Visit  Patient: Breanna Martinez   DOB: 08/19/1960   62 y.o. Female  MRN: [id number] Visit Date: 08/13/2022  Subjective:    Chief Complaint  Patient presents with   Office Visit    DM / Hyperlipidemia  Pt fasting  Checks BS daily , says it fluctuates    HPI DM (diabetes mellitus) ([place]) Repeat hgbA1c today BP at goal LDL at goal No neuropathy Maintain metformin dose   She traveled by plane last week. Did not feel well x 2days, but symptom are now completely resolved. She wants COVID test completed today.  Reviewed medical, surgical, and social history today  Medications: Outpatient Medications Prior to Visit  Medication Sig   ACCU-CHEK GUIDE test strip CHECK BLOOD GLUCOSE ONCE DAILY   Calcium Citrate (CITRACAL PO) Take by mouth.   denosumab (PROLIA) 60 MG/ML SOSY injection Inject 60 mg into the skin every 6 (six) months.   metFORMIN (GLUCOPHAGE) 500 MG tablet Take 1 tablet (500 mg total) by mouth daily with breakfast.   Multiple Vitamins-Minerals (MULTIVITAMIN ADULT EXTRA C PO) multivitamin  Take 1 tablet as directed by oral route   Turmeric (QC TUMERIC COMPLEX PO) Take by mouth.   [DISCONTINUED] Vitamin D, Ergocalciferol, (DRISDOL) 1.25 MG (50000 UNIT) CAPS capsule Take 1 capsule (50,000 Units total) by mouth every 7 (seven) days.   No facility-administered medications prior to visit.   Reviewed past medical and social history.   ROS per HPI above      Objective:  BP 118/78 (BP Location: Right Arm, Patient Position: Sitting, Cuff Size: Small)   Pulse 71   Temp (!) 96.6 F (35.9 C) (Temporal)   Ht '5\' 4"'$  (1.626 m)   Wt 137 lb 6.4 oz (62.3 kg)   SpO2 99%   BMI 23.58 kg/m      Physical Exam Vitals reviewed.  Cardiovascular:     Rate and Rhythm: Normal rate and regular rhythm.     Pulses: Normal pulses.     Heart sounds: Normal heart sounds.  Pulmonary:     Effort: Pulmonary effort is normal.     Breath sounds: Normal  breath sounds.  Musculoskeletal:        General: Normal range of motion.     Right lower leg: No edema.     Left lower leg: No edema.  Neurological:     Mental Status: She is alert and oriented to person, place, and time.     Results for orders placed or performed in visit on 08/13/22  POC COVID-19  Result Value Ref Range   SARS Coronavirus 2 Ag Negative Negative      Assessment & Plan:    Problem List Items Addressed This Visit       Endocrine   DM (diabetes mellitus) ([place]) - Primary    Repeat hgbA1c today BP at goal LDL at goal No neuropathy Maintain metformin dose      Relevant Orders   Microalbumin / creatinine urine ratio   Renal Function Panel   Other Visit Diagnoses     At increased risk of exposure to COVID-19 virus       Relevant Orders   POC COVID-19 (Completed)      Return in about 6 months (around 02/11/2023) for CPE (fasting).     Wilfred Lacy, NP

## 2022-08-13 NOTE — Patient Instructions (Addendum)
Go to lab Maintain current med dose

## 2022-08-13 NOTE — Assessment & Plan Note (Addendum)
Repeat hgbA1c today BP at goal LDL at 79 No neuropathy Maintain metformin dose

## 2022-08-31 ENCOUNTER — Ambulatory Visit: Payer: Federal, State, Local not specified - PPO | Admitting: Nurse Practitioner

## 2022-09-10 ENCOUNTER — Telehealth: Payer: Self-pay | Admitting: Nurse Practitioner

## 2022-09-10 NOTE — Telephone Encounter (Signed)
Patient is requesting a call back, She received a bill and talked to billing and they said we sent the wrong diagnoses code. She would like a call back at [phone]

## 2022-09-10 NOTE — Telephone Encounter (Signed)
LVMTRC 

## 2022-09-10 NOTE — Telephone Encounter (Signed)
The patient called back and she was billed because she returned in four months instead of three months. Our billing advised her the visit was coded wrong.

## 2022-09-11 NOTE — Telephone Encounter (Signed)
Message sent to coding team to review for DOS 08/13/2022

## 2022-09-25 ENCOUNTER — Encounter: Payer: Self-pay | Admitting: Nurse Practitioner

## 2022-09-25 DIAGNOSIS — G8929 Other chronic pain: Secondary | ICD-10-CM

## 2022-09-25 DIAGNOSIS — M19019 Primary osteoarthritis, unspecified shoulder: Secondary | ICD-10-CM

## 2022-09-25 DIAGNOSIS — M12811 Other specific arthropathies, not elsewhere classified, right shoulder: Secondary | ICD-10-CM

## 2022-09-26 MED ORDER — MELOXICAM 7.5 MG PO TABS: 7.5000 mg | ORAL_TABLET | Freq: Every day | ORAL | 0 refills | Status: DC

## 2022-09-27 ENCOUNTER — Telehealth: Payer: Self-pay

## 2022-09-27 NOTE — Telephone Encounter (Signed)
Last Prolia inj: 01/11/2022

## 2022-09-28 ENCOUNTER — Other Ambulatory Visit (HOSPITAL_COMMUNITY): Payer: Self-pay

## 2022-09-28 NOTE — Telephone Encounter (Signed)
Prolia VOB initiated via [email or web address] 

## 2022-10-02 ENCOUNTER — Ambulatory Visit (INDEPENDENT_AMBULATORY_CARE_PROVIDER_SITE_OTHER)
Admission: RE | Admit: 2022-10-02 | Discharge: 2022-10-02 | Disposition: A | Discharge status: Home / Self Care | Payer: Federal, State, Local not specified - PPO | Source: Ambulatory Visit | Attending: Nurse Practitioner | Admitting: Nurse Practitioner

## 2022-10-02 DIAGNOSIS — M25511 Pain in right shoulder: Secondary | ICD-10-CM

## 2022-10-02 DIAGNOSIS — G8929 Other chronic pain: Secondary | ICD-10-CM

## 2022-10-02 DIAGNOSIS — M19011 Primary osteoarthritis, right shoulder: Secondary | ICD-10-CM | POA: Diagnosis not present

## 2022-10-02 NOTE — Telephone Encounter (Signed)
Notified pt this was resubmitted. Pt states she has received corrected bill now.

## 2022-10-03 NOTE — Addendum Note (Signed)
Addended by: Wilfred Lacy L on: 10/03/2022 12:02 PM   Modules accepted: Orders

## 2022-10-04 ENCOUNTER — Other Ambulatory Visit (HOSPITAL_COMMUNITY): Payer: Self-pay

## 2022-10-04 NOTE — Telephone Encounter (Addendum)
Initiated PA via CMM. PA request cannot be processed electronically. Called [facility] [phone]) and spoke to [place]. She stated that it does not need a PA but will need to be sent to [facility] specialty pharmacy and sent to the office.

## 2022-10-11 ENCOUNTER — Encounter: Payer: Self-pay | Admitting: Orthopaedic Surgery

## 2022-10-11 ENCOUNTER — Ambulatory Visit: Payer: Self-pay

## 2022-10-11 ENCOUNTER — Ambulatory Visit: Payer: Federal, State, Local not specified - PPO | Admitting: Orthopaedic Surgery

## 2022-10-11 ENCOUNTER — Ambulatory Visit (INDEPENDENT_AMBULATORY_CARE_PROVIDER_SITE_OTHER): Payer: Federal, State, Local not specified - PPO | Admitting: Sports Medicine

## 2022-10-11 DIAGNOSIS — M25511 Pain in right shoulder: Secondary | ICD-10-CM | POA: Diagnosis not present

## 2022-10-11 DIAGNOSIS — G8929 Other chronic pain: Secondary | ICD-10-CM

## 2022-10-11 DIAGNOSIS — M19011 Primary osteoarthritis, right shoulder: Secondary | ICD-10-CM | POA: Diagnosis not present

## 2022-10-11 MED ORDER — METHYLPREDNISOLONE ACETATE 40 MG/ML IJ SUSP
40.0000 mg | INTRAMUSCULAR | Status: AC | PRN
  Administered 2022-10-11: 40 mg via INTRA_ARTICULAR

## 2022-10-11 MED ORDER — LIDOCAINE HCL 1 % IJ SOLN
2.0000 mL | INTRAMUSCULAR | Status: AC | PRN
  Administered 2022-10-11: 2 mL

## 2022-10-11 MED ORDER — BUPIVACAINE HCL 0.25 % IJ SOLN
2.0000 mL | INTRAMUSCULAR | Status: AC | PRN
  Administered 2022-10-11: 2 mL via INTRA_ARTICULAR

## 2022-10-11 NOTE — Progress Notes (Signed)
   Procedure Note  Patient: Breanna Martinez             Date of Birth: 1961-06-10           MRN: [id number]             Visit Date: 10/11/2022  Procedures: Visit Diagnoses:  1. Primary osteoarthritis, right shoulder   2. Chronic right shoulder pain    Large Joint Inj: R glenohumeral on 10/11/2022 10:40 AM Indications: pain Details: 22 G 3.5 in needle, ultrasound-guided posterior approach Medications: 2 mL lidocaine 1 %; 2 mL bupivacaine 0.25 %; 40 mg methylPREDNISolone acetate 40 MG/ML Outcome: tolerated well, no immediate complications  US-guided glenohumeral joint injection, right shoulder After discussion on risks/benefits/indications, informed verbal consent was obtained. A timeout was then performed. The patient was positioned lying lateral recumbent on examination table. The patient's shoulder was prepped with betadine and multiple alcohol swabs and utilizing ultrasound guidance, the patient's glenohumeral joint was identified on ultrasound. Using ultrasound guidance a 22-gauge, 3.5 inch needle with a mixture of 2:2:1 cc's lidocaine:bupivicaine:depomedrol was directed from a lateral to medial direction via in-plane technique into the glenohumeral joint with visualization of appropriate spread of injectate into the joint. Patient tolerated the procedure well without immediate complications.      Procedure, treatment alternatives, risks and benefits explained, specific risks discussed. Consent was given by the patient. Immediately prior to procedure a time out was called to verify the correct patient, procedure, equipment, support staff and site/side marked as required. Patient was prepped and draped in the usual sterile fashion.    - I evaluated the patient about 10 minutes post-injection and she had improvement in pain and range of motion - follow-up with Dr. Erlinda Hong as indicated; I am happy to see them as needed  Elba Barman, DO [place]  This note was dictated using Dragon naturally speaking software and may contain errors in syntax, spelling, or content which have not been identified prior to signing this note.

## 2022-10-11 NOTE — Progress Notes (Signed)
Office Visit Note   Patient: Breanna Martinez           Date of Birth: 03/09/1961           MRN: [id number] Visit Date: 10/11/2022              Requested by: Flossie Buffy, NP [place],  [place] [id number] PCP: Flossie Buffy, NP   Assessment & Plan: Visit Diagnoses:  1. Chronic right shoulder pain     Plan: 62 year old female with chronic right shoulder pain for a few months status post rotator cuff repair about 3 years ago.  Impression is glenohumeral osteoarthritis flare.  Will try a glenohumeral injection under ultrasound.  Will send a referral to Dr. Rolena Infante.  Home exercises provided.  Follow-up as needed.  Follow-Up Instructions: No follow-ups on file.   Orders:  No orders of the defined types were placed in this encounter.  No orders of the defined types were placed in this encounter.     Procedures: No procedures performed   Clinical Data: No additional findings.   Subjective: Chief Complaint  Patient presents with   Right Shoulder - Pain    HPI  Patient is here for evaluation of chronic right shoulder pain with aching.  Underwent rotator cuff repair 3 years ago.  Reports ROM is at baseline.  Pain is getting worse.  PCP prescribed some meds for the pain.  Review of Systems  Constitutional: Negative.   HENT: Negative.    Eyes: Negative.   Respiratory: Negative.    Cardiovascular: Negative.   Endocrine: Negative.   Musculoskeletal: Negative.   Neurological: Negative.   Hematological: Negative.   Psychiatric/Behavioral: Negative.    All other systems reviewed and are negative.    Objective: Vital Signs: There were no vitals taken for this visit.  Physical Exam Vitals and nursing note reviewed.  Constitutional:      Appearance: She is well-developed.  HENT:     Head: Normocephalic and atraumatic.  Pulmonary:     Effort: Pulmonary effort is normal.  Abdominal:     Palpations: Abdomen is soft.   Musculoskeletal:     Cervical back: Neck supple.  Skin:    General: Skin is warm.     Capillary Refill: Capillary refill takes less than 2 seconds.  Neurological:     Mental Status: She is alert and oriented to person, place, and time.  Psychiatric:        Behavior: Behavior normal.        Thought Content: Thought content normal.        Judgment: Judgment normal.     Ortho Exam  Examination right shoulder shows slight decreased range of motion in all planes.  Strength is mildly decreased to manual muscle testing of the rotator cuff.  Mild pain with impingement test.  Specialty Comments:  No specialty comments available.  Imaging: No results found.   PMFS History: Patient Active Problem List   Diagnosis Date Noted   Hematochezia 06/01/2022   Vitreomacular adhesion of both eyes 03/20/2022   Nuclear sclerotic cataract of both eyes 03/20/2022   Localized primary osteoarthritis of carpometacarpal ([place]) joint of right wrist 02/15/2022   Chronic left hip pain 02/15/2022   Vitamin D deficiency 11/06/2021   Toe pain, bilateral 11/06/2021   Osteoporosis 02/19/2016   DM (diabetes mellitus) ([place]) 10/15/2015   Past Medical History:  Diagnosis Date   Allergy    seasonal allergies   Osteoporosis  Family History  Problem Relation Age of Onset   Diabetes Mother    Glaucoma Mother    Prostate cancer Father    Colon cancer Neg Hx    Colon polyps Neg Hx    Esophageal cancer Neg Hx    Rectal cancer Neg Hx    Stomach cancer Neg Hx     Past Surgical History:  Procedure Laterality Date   rotator cuff surgery     TOE SURGERY     Social History   Occupational History   Not on file  Tobacco Use   Smoking status: Never   Smokeless tobacco: Never  Vaping Use   Vaping Use: Never used  Substance and Sexual Activity   Alcohol use: Yes    Comment: socially    Drug use: No   Sexual activity: Not on file

## 2022-10-23 ENCOUNTER — Other Ambulatory Visit: Payer: Self-pay | Admitting: Nurse Practitioner

## 2022-10-23 DIAGNOSIS — G8929 Other chronic pain: Secondary | ICD-10-CM

## 2022-11-20 ENCOUNTER — Other Ambulatory Visit: Payer: Self-pay | Admitting: Nurse Practitioner

## 2022-11-20 DIAGNOSIS — G8929 Other chronic pain: Secondary | ICD-10-CM

## 2022-12-17 ENCOUNTER — Other Ambulatory Visit: Payer: Self-pay | Admitting: Nurse Practitioner

## 2022-12-17 DIAGNOSIS — E1165 Type 2 diabetes mellitus with hyperglycemia: Secondary | ICD-10-CM

## 2023-01-24 ENCOUNTER — Encounter: Payer: Self-pay | Admitting: Nurse Practitioner

## 2023-01-24 ENCOUNTER — Ambulatory Visit: Payer: Federal, State, Local not specified - PPO | Admitting: Nurse Practitioner

## 2023-01-24 VITALS — BP 112/64 | HR 86 | Temp 98.7°F | Resp 18 | Ht 64.0 in | Wt 141.0 lb

## 2023-01-24 DIAGNOSIS — J020 Streptococcal pharyngitis: Secondary | ICD-10-CM

## 2023-01-24 DIAGNOSIS — J208 Acute bronchitis due to other specified organisms: Secondary | ICD-10-CM

## 2023-01-24 LAB — POC COVID19 BINAXNOW: SARS Coronavirus 2 Ag: NEGATIVE

## 2023-01-24 LAB — POCT RAPID STREP A (OFFICE): Rapid Strep A Screen: POSITIVE — AB

## 2023-01-24 MED ORDER — ALBUTEROL SULFATE HFA 108 (90 BASE) MCG/ACT IN AERS: 2.0000 | INHALATION_SPRAY | Freq: Four times a day (QID) | RESPIRATORY_TRACT | 2 refills | Status: DC | PRN

## 2023-01-24 MED ORDER — AZITHROMYCIN 250 MG PO TABS: ORAL_TABLET | ORAL | 0 refills | Status: DC

## 2023-01-24 MED ORDER — PROMETHAZINE-DM 6.25-15 MG/5ML PO SYRP
5.0000 mL | ORAL_SOLUTION | Freq: Three times a day (TID) | ORAL | 0 refills | Status: DC | PRN
Start: 2023-01-24 — End: 2023-02-19

## 2023-01-24 MED ORDER — ALBUTEROL SULFATE HFA 108 (90 BASE) MCG/ACT IN AERS
1.0000 | INHALATION_SPRAY | Freq: Four times a day (QID) | RESPIRATORY_TRACT | 2 refills | Status: DC | PRN
Start: 2023-01-24 — End: 2023-07-10

## 2023-01-24 NOTE — Patient Instructions (Signed)
Call office if no improvement in 7-10days.  Encourage adequate oral hydration. Use over-the-counter  cold" medicine  such as Dayquil/nyquil for cough and congestion.  Use mucinex DM or Robitussin  or delsym for cough without congestion  You can use plain "Tylenol" or "Advil" for fever, chills and achyness. Use cool mist humidifier at bedtime to help with nasal congestion and cough.  Cold/cough medications may have tylenol or ibuprofen or guaifenesin or dextromethophan in them, so be careful not to take beyond the recommended dose for each of these medications.

## 2023-01-24 NOTE — Progress Notes (Signed)
Acute Office Visit  Subjective:    Patient ID: Breanna Martinez, female    DOB: August 23, 1960, 62 y.o.   MRN: [id number]  Chief Complaint  Patient presents with   URI    Cough and sore throat started Monday 6/17. Throat is worse on L side.    URI  This is a new problem. The current episode started in the past 7 days. The problem has been unchanged. The maximum temperature recorded prior to her arrival was 100.4 - 100.9 F. The fever has been present for 1 to 2 days. Associated symptoms include congestion, coughing, headaches, joint pain, rhinorrhea, sinus pain, a sore throat and wheezing. Pertinent negatives include no abdominal pain, chest pain, diarrhea, dysuria, ear pain, joint swelling, nausea, neck pain, plugged ear sensation, rash, sneezing, swollen glands or vomiting. She has tried acetaminophen for the symptoms.   Outpatient Medications Prior to Visit  Medication Sig   ACCU-CHEK GUIDE test strip CHECK BLOOD GLUCOSE ONCE DAILY   Calcium Citrate (CITRACAL PO) Take by mouth.   denosumab (PROLIA) 60 MG/ML SOSY injection Inject 60 mg into the skin every 6 (six) months.   meloxicam (MOBIC) 7.5 MG tablet TAKE 1 TABLET BY MOUTH EVERY DAY   metFORMIN (GLUCOPHAGE) 500 MG tablet TAKE 1 TABLET BY MOUTH EVERY DAY WITH BREAKFAST   Multiple Vitamins-Minerals (MULTIVITAMIN ADULT EXTRA C PO) multivitamin  Take 1 tablet as directed by oral route   Turmeric (QC TUMERIC COMPLEX PO) Take by mouth.   No facility-administered medications prior to visit.   Reviewed past medical and social history.   Review of Systems  HENT:  Positive for congestion, rhinorrhea, sinus pain and sore throat. Negative for ear pain and sneezing.   Respiratory:  Positive for cough and wheezing.   Cardiovascular:  Negative for chest pain.  Gastrointestinal:  Negative for abdominal pain, diarrhea, nausea and vomiting.  Genitourinary:  Negative for dysuria.  Musculoskeletal:  Positive for joint pain. Negative for neck  pain.  Skin:  Negative for rash.  Neurological:  Positive for headaches.   Per HPI     Objective:    Physical Exam Vitals reviewed.  Constitutional:      General: She is not in acute distress. HENT:     Right Ear: Tympanic membrane, ear canal and external ear normal.     Left Ear: Tympanic membrane, ear canal and external ear normal.     Nose: Congestion and rhinorrhea present. No nasal tenderness or mucosal edema.     Right Nostril: No occlusion.     Left Nostril: No occlusion.     Right Turbinates: Not enlarged, swollen or pale.     Left Turbinates: Not enlarged, swollen or pale.     Right Sinus: No maxillary sinus tenderness or frontal sinus tenderness.     Left Sinus: No maxillary sinus tenderness or frontal sinus tenderness.     Mouth/Throat:     Pharynx: Uvula midline. Oropharyngeal exudate and posterior oropharyngeal erythema present.     Tonsils: No tonsillar exudate or tonsillar abscesses.  Eyes:     Extraocular Movements: Extraocular movements intact.     Conjunctiva/sclera: Conjunctivae normal.  Cardiovascular:     Rate and Rhythm: Normal rate and regular rhythm.     Pulses: Normal pulses.     Heart sounds: Normal heart sounds.  Pulmonary:     Effort: Pulmonary effort is normal.     Breath sounds: Normal breath sounds.  Musculoskeletal:     Cervical back: Normal range  of motion and neck supple.  Lymphadenopathy:     Cervical: Cervical adenopathy present.  Neurological:     Mental Status: She is alert and oriented to person, place, and time.    BP 112/64 (BP Location: Left Arm, Patient Position: Sitting, Cuff Size: Normal)   Pulse 86   Temp 98.7 F (37.1 C) (Oral)   Resp 18   Ht 5\' 4"  (1.626 m)   Wt 141 lb (64 kg)   SpO2 98%   BMI 24.20 kg/m    Results for orders placed or performed in visit on 01/24/23  POCT rapid strep A  Result Value Ref Range   Rapid Strep A Screen Positive (A) Negative  POC COVID-19 BinaxNow  Result Value Ref Range   SARS  Coronavirus 2 Ag Negative Negative      Assessment & Plan:   Problem List Items Addressed This Visit   None Visit Diagnoses     Acute bronchitis due to other specified organisms    -  Primary   Relevant Medications   azithromycin (ZITHROMAX Z-PAK) 250 MG tablet   promethazine-dextromethorphan (PROMETHAZINE-DM) 6.25-15 MG/5ML syrup   albuterol (VENTOLIN HFA) 108 (90 Base) MCG/ACT inhaler   Other Relevant Orders   POC COVID-19 BinaxNow (Completed)   Strep pharyngitis       Relevant Medications   azithromycin (ZITHROMAX Z-PAK) 250 MG tablet   Other Relevant Orders   POCT rapid strep A (Completed)      Meds ordered this encounter  Medications   DISCONTD: albuterol (VENTOLIN HFA) 108 (90 Base) MCG/ACT inhaler    Sig: Inhale 2 puffs into the lungs every 6 (six) hours as needed for wheezing or shortness of breath.    Dispense:  8 g    Refill:  2    Order Specific Question:   Supervising Provider    Answer:   Nadene Rubins ALFRED [[id number]]   azithromycin (ZITHROMAX Z-PAK) 250 MG tablet    Sig: Take 2tabs on first day, then 1tab once a day till complete    Dispense:  6 tablet    Refill:  0    Order Specific Question:   Supervising Provider    Answer:   Mliss Sax [[id number]]   promethazine-dextromethorphan (PROMETHAZINE-DM) 6.25-15 MG/5ML syrup    Sig: Take 5 mLs by mouth 3 (three) times daily as needed for cough.    Dispense:  240 mL    Refill:  0    Order Specific Question:   Supervising Provider    Answer:   Nadene Rubins ALFRED [[id number]]   albuterol (VENTOLIN HFA) 108 (90 Base) MCG/ACT inhaler    Sig: Inhale 1-2 puffs into the lungs every 6 (six) hours as needed for wheezing or shortness of breath.    Dispense:  8 g    Refill:  2    Order Specific Question:   Supervising Provider    Answer:   Mliss Sax [[id number]]   Return if symptoms worsen or fail to improve.    Alysia Penna, NP

## 2023-02-18 ENCOUNTER — Telehealth: Payer: Self-pay | Admitting: Nurse Practitioner

## 2023-02-18 NOTE — Telephone Encounter (Signed)
FYI:Pt called in stating her sugar was at 55, I transferred her over to nurse triage.

## 2023-02-19 ENCOUNTER — Encounter: Payer: Self-pay | Admitting: Nurse Practitioner

## 2023-02-19 ENCOUNTER — Ambulatory Visit: Payer: Federal, State, Local not specified - PPO | Admitting: Nurse Practitioner

## 2023-02-19 VITALS — BP 102/70 | HR 72 | Resp 16 | Wt 143.0 lb

## 2023-02-19 DIAGNOSIS — E11649 Type 2 diabetes mellitus with hypoglycemia without coma: Secondary | ICD-10-CM

## 2023-02-19 DIAGNOSIS — Z7984 Long term (current) use of oral hypoglycemic drugs: Secondary | ICD-10-CM | POA: Diagnosis not present

## 2023-02-19 DIAGNOSIS — E1165 Type 2 diabetes mellitus with hyperglycemia: Secondary | ICD-10-CM | POA: Diagnosis not present

## 2023-02-19 LAB — POCT GLYCOSYLATED HEMOGLOBIN (HGB A1C): Hemoglobin A1C: 7 % — AB (ref 4.0–5.6)

## 2023-02-19 NOTE — Assessment & Plan Note (Signed)
Reports AM glucose 55-75, symptomatic Admits to skipping meals, take metformin on empty stomach, does not always eat protein with meals. Repeat hgbA1c today: 7.0% Advised to schedule appointment for annual Dm eye exam. Advised to maintain 2-3 balanced meals daily, decreased metformin dose to 1/2tabd with heaviest meal. Provided printed information on recommended diet. F/up in 3months

## 2023-02-19 NOTE — Patient Instructions (Signed)
Decrease metformin dose to 1/2tab daily with heaviest meal. Eat protein and complex carb with every meal.  Diabetes Mellitus and Nutrition, Adult When you have diabetes, or diabetes mellitus, it is very important to have healthy eating habits because your blood sugar (glucose) levels are greatly affected by what you eat and drink. Eating healthy foods in the right amounts, at about the same times every day, can help you: Manage your blood glucose. Lower your risk of heart disease. Improve your blood pressure. Reach or maintain a healthy weight. What can affect my meal plan? Every person with diabetes is different, and each person has different needs for a meal plan. Your health care provider may recommend that you work with a dietitian to make a meal plan that is best for you. Your meal plan may vary depending on factors such as: The calories you need. The medicines you take. Your weight. Your blood glucose, blood pressure, and cholesterol levels. Your activity level. Other health conditions you have, such as heart or kidney disease. How do carbohydrates affect me? Carbohydrates, also called carbs, affect your blood glucose level more than any other type of food. Eating carbs raises the amount of glucose in your blood. It is important to know how many carbs you can safely have in each meal. This is different for every person. Your dietitian can help you calculate how many carbs you should have at each meal and for each snack. How does alcohol affect me? Alcohol can cause a decrease in blood glucose (hypoglycemia), especially if you use insulin or take certain diabetes medicines by mouth. Hypoglycemia can be a life-threatening condition. Symptoms of hypoglycemia, such as sleepiness, dizziness, and confusion, are similar to symptoms of having too much alcohol. Do not drink alcohol if: Your health care provider tells you not to drink. You are pregnant, may be pregnant, or are planning to become  pregnant. If you drink alcohol: Limit how much you have to: 0-1 drink a day for women. 0-2 drinks a day for men. Know how much alcohol is in your drink. In the [place] one drink equals one 12 oz bottle of beer (355 mL), one 5 oz glass of wine (148 mL), or one 1 oz glass of hard liquor (44 mL). Keep yourself hydrated with water, diet soda, or unsweetened iced tea. Keep in mind that regular soda, juice, and other mixers may contain a lot of sugar and must be counted as carbs. What are tips for following this plan?  Reading food labels Start by checking the serving size on the Nutrition Facts label of packaged foods and drinks. The number of calories and the amount of carbs, fats, and other nutrients listed on the label are based on one serving of the item. Many items contain more than one serving per package. Check the total grams (g) of carbs in one serving. Check the number of grams of saturated fats and trans fats in one serving. Choose foods that have a low amount or none of these fats. Check the number of milligrams (mg) of salt (sodium) in one serving. Most people should limit total sodium intake to less than 2,300 mg per day. Always check the nutrition information of foods labeled as "low-fat" or "nonfat." These foods may be higher in added sugar or refined carbs and should be avoided. Talk to your dietitian to identify your daily goals for nutrients listed on the label. Shopping Avoid buying canned, pre-made, or processed foods. These foods tend to be high in  fat, sodium, and added sugar. Shop around the outside edge of the grocery store. This is where you will most often find fresh fruits and vegetables, bulk grains, fresh meats, and fresh dairy products. Cooking Use low-heat cooking methods, such as baking, instead of high-heat cooking methods, such as deep frying. Cook using healthy oils, such as olive, canola, or sunflower oil. Avoid cooking with butter, cream, or high-fat meats. Meal  planning Eat meals and snacks regularly, preferably at the same times every day. Avoid going long periods of time without eating. Eat foods that are high in fiber, such as fresh fruits, vegetables, beans, and whole grains. Eat 4-6 oz (112-168 g) of lean protein each day, such as lean meat, chicken, fish, eggs, or tofu. One ounce (oz) (28 g) of lean protein is equal to: 1 oz (28 g) of meat, chicken, or fish. 1 egg.  cup (62 g) of tofu. Eat some foods each day that contain healthy fats, such as avocado, nuts, seeds, and fish. What foods should I eat? Fruits Berries. Apples. Oranges. Peaches. Apricots. Plums. Grapes. Mangoes. Papayas. Pomegranates. Kiwi. Cherries. Vegetables Leafy greens, including lettuce, spinach, kale, chard, collard greens, mustard greens, and cabbage. Beets. Cauliflower. Broccoli. Carrots. Green beans. Tomatoes. Peppers. Onions. Cucumbers. Brussels sprouts. Grains Whole grains, such as whole-wheat or whole-grain bread, crackers, tortillas, cereal, and pasta. Unsweetened oatmeal. Quinoa. Brown or wild rice. Meats and other proteins Seafood. Poultry without skin. Lean cuts of poultry and beef. Tofu. Nuts. Seeds. Dairy Low-fat or fat-free dairy products such as milk, yogurt, and cheese. The items listed above may not be a complete list of foods and beverages you can eat and drink. Contact a dietitian for more information. What foods should I avoid? Fruits Fruits canned with syrup. Vegetables Canned vegetables. Frozen vegetables with butter or cream sauce. Grains Refined white flour and flour products such as bread, pasta, snack foods, and cereals. Avoid all processed foods. Meats and other proteins Fatty cuts of meat. Poultry with skin. Breaded or fried meats. Processed meat. Avoid saturated fats. Dairy Full-fat yogurt, cheese, or milk. Beverages Sweetened drinks, such as soda or iced tea. The items listed above may not be a complete list of foods and beverages you  should avoid. Contact a dietitian for more information. Questions to ask a health care provider Do I need to meet with a certified diabetes care and education specialist? Do I need to meet with a dietitian? What number can I call if I have questions? When are the best times to check my blood glucose? Where to find more information: American Diabetes Association: diabetes.org Academy of Nutrition and Dietetics: eatright.[employer] of Diabetes and Digestive and Kidney Diseases: StageSync.si Association of Diabetes Care & Education Specialists: diabeteseducator.org Summary It is important to have healthy eating habits because your blood sugar (glucose) levels are greatly affected by what you eat and drink. It is important to use alcohol carefully. A healthy meal plan will help you manage your blood glucose and lower your risk of heart disease. Your health care provider may recommend that you work with a dietitian to make a meal plan that is best for you. This information is not intended to replace advice given to you by your health care provider. Make sure you discuss any questions you have with your health care provider. Document Revised: 02/24/2020 Document Reviewed: 02/24/2020 Elsevier Patient Education  2024 [employer].

## 2023-02-19 NOTE — Progress Notes (Signed)
Established Patient Visit  Patient: Breanna Martinez   DOB: Mar 03, 1961   62 y.o. Female  MRN: [id number] Visit Date: 02/19/2023  Subjective:    Chief Complaint  Patient presents with   Hypoglycemia    Started Saturday.  Has been fluctuating for a week    Hypoglycemia   DM (diabetes mellitus) (HCC) Reports AM glucose 55-75, symptomatic Admits to skipping meals, take metformin on empty stomach, does not always eat protein with meals. Repeat hgbA1c today: 7.0% Advised to schedule appointment for annual Dm eye exam. Advised to maintain 2-3 balanced meals daily, decreased metformin dose to 1/2tabd with heaviest meal. Provided printed information on recommended diet. F/up in 3months  Wt Readings from Last 3 Encounters:  02/19/23 143 lb (64.9 kg)  01/24/23 141 lb (64 kg)  08/13/22 137 lb 6.4 oz (62.3 kg)    Reviewed medical, surgical, and social history today  Medications: Outpatient Medications Prior to Visit  Medication Sig   ACCU-CHEK GUIDE test strip CHECK BLOOD GLUCOSE ONCE DAILY   albuterol (VENTOLIN HFA) 108 (90 Base) MCG/ACT inhaler Inhale 1-2 puffs into the lungs every 6 (six) hours as needed for wheezing or shortness of breath.   Calcium Citrate (CITRACAL PO) Take by mouth.   denosumab (PROLIA) 60 MG/ML SOSY injection Inject 60 mg into the skin every 6 (six) months.   meloxicam (MOBIC) 7.5 MG tablet TAKE 1 TABLET BY MOUTH EVERY DAY   Multiple Vitamins-Minerals (MULTIVITAMIN ADULT EXTRA C PO) multivitamin  Take 1 tablet as directed by oral route   Turmeric (QC TUMERIC COMPLEX PO) Take by mouth.   [DISCONTINUED] metFORMIN (GLUCOPHAGE) 500 MG tablet TAKE 1 TABLET BY MOUTH EVERY DAY WITH BREAKFAST   metFORMIN (GLUCOPHAGE) 500 MG tablet Take 0.5 tablets (250 mg total) by mouth daily with breakfast.   [DISCONTINUED] azithromycin (ZITHROMAX Z-PAK) 250 MG tablet Take 2tabs on first day, then 1tab once a day till complete (Patient not taking: Reported on  02/19/2023)   [DISCONTINUED] promethazine-dextromethorphan (PROMETHAZINE-DM) 6.25-15 MG/5ML syrup Take 5 mLs by mouth 3 (three) times daily as needed for cough. (Patient not taking: Reported on 02/19/2023)   No facility-administered medications prior to visit.   Reviewed past medical and social history.   ROS per HPI above      Objective:  BP 102/70 (BP Location: Left Arm, Patient Position: Sitting, Cuff Size: Normal)   Pulse 72   Resp 16   Wt 143 lb (64.9 kg)   SpO2 98%   BMI 24.55 kg/m      Physical Exam Vitals reviewed.  Cardiovascular:     Rate and Rhythm: Normal rate.     Pulses: Normal pulses.  Pulmonary:     Effort: Pulmonary effort is normal.  Neurological:     Mental Status: She is alert and oriented to person, place, and time.  Psychiatric:        Mood and Affect: Mood normal.        Behavior: Behavior normal.        Thought Content: Thought content normal.     Results for orders placed or performed in visit on 02/19/23  POCT glycosylated hemoglobin (Hb A1C)  Result Value Ref Range   Hemoglobin A1C 7.0 (A) 4.0 - 5.6 %   HbA1c POC (<> result, manual entry)     HbA1c, POC (prediabetic range)     HbA1c, POC (controlled diabetic range)  Assessment & Plan:    Problem List Items Addressed This Visit       Endocrine   DM (diabetes mellitus) (HCC) - Primary    Reports AM glucose 55-75, symptomatic Admits to skipping meals, take metformin on empty stomach, does not always eat protein with meals. Repeat hgbA1c today: 7.0% Advised to schedule appointment for annual Dm eye exam. Advised to maintain 2-3 balanced meals daily, decreased metformin dose to 1/2tabd with heaviest meal. Provided printed information on recommended diet. F/up in 3months      Relevant Medications   metFORMIN (GLUCOPHAGE) 500 MG tablet   Other Relevant Orders   POCT glycosylated hemoglobin (Hb A1C) (Completed)   Return in about 3 months (around 05/22/2023) for DM,  hyperlipidemia (fasting).     Alysia Penna, NP

## 2023-02-28 ENCOUNTER — Ambulatory Visit: Payer: Federal, State, Local not specified - PPO | Admitting: Nurse Practitioner

## 2023-03-25 ENCOUNTER — Encounter (INDEPENDENT_AMBULATORY_CARE_PROVIDER_SITE_OTHER): Payer: Self-pay

## 2023-03-25 ENCOUNTER — Encounter (INDEPENDENT_AMBULATORY_CARE_PROVIDER_SITE_OTHER): Payer: Federal, State, Local not specified - PPO | Admitting: Ophthalmology

## 2023-03-25 DIAGNOSIS — H1013 Acute atopic conjunctivitis, bilateral: Secondary | ICD-10-CM | POA: Diagnosis not present

## 2023-03-25 DIAGNOSIS — E119 Type 2 diabetes mellitus without complications: Secondary | ICD-10-CM | POA: Diagnosis not present

## 2023-03-25 DIAGNOSIS — H2513 Age-related nuclear cataract, bilateral: Secondary | ICD-10-CM | POA: Diagnosis not present

## 2023-03-25 DIAGNOSIS — H43823 Vitreomacular adhesion, bilateral: Secondary | ICD-10-CM | POA: Diagnosis not present

## 2023-03-25 LAB — HM DIABETES EYE EXAM

## 2023-05-06 ENCOUNTER — Encounter: Payer: Self-pay | Admitting: Nurse Practitioner

## 2023-05-06 ENCOUNTER — Ambulatory Visit: Payer: Federal, State, Local not specified - PPO | Admitting: Nurse Practitioner

## 2023-05-06 VITALS — BP 112/77 | HR 79 | Wt 145.0 lb

## 2023-05-06 DIAGNOSIS — J0100 Acute maxillary sinusitis, unspecified: Secondary | ICD-10-CM

## 2023-05-06 LAB — POC INFLUENZA A&B (BINAX/QUICKVUE)
Influenza A, POC: NEGATIVE
Influenza B, POC: NEGATIVE

## 2023-05-06 LAB — POC COVID19 BINAXNOW: SARS Coronavirus 2 Ag: NEGATIVE

## 2023-05-06 MED ORDER — AZITHROMYCIN 250 MG PO TABS
250.0000 mg | ORAL_TABLET | Freq: Every day | ORAL | 0 refills | Status: DC
Start: 2023-05-06 — End: 2023-05-22

## 2023-05-06 NOTE — Progress Notes (Signed)
Acute Office Visit  Subjective:    Patient ID: Breanna Martinez, female    DOB: 09/20/1960, 62 y.o.   MRN: [id number]  Chief Complaint  Patient presents with   Nasal Congestion    Started Saturday; congestion, headache, sneezing, started coughing last night. Pt states she has been using a nasal spray but does not help much. Denies fever.     Sinus Problem This is a new problem. The current episode started 1 to 4 weeks ago. The problem has been gradually worsening since onset. There has been no fever. Associated symptoms include congestion, coughing, headaches, sinus pressure, sneezing and a sore throat. Pertinent negatives include no chills, diaphoresis, ear pain, hoarse voice, neck pain, shortness of breath or swollen glands. Past treatments include saline nose sprays. The treatment provided no relief.   Outpatient Medications Prior to Visit  Medication Sig   ACCU-CHEK GUIDE test strip CHECK BLOOD GLUCOSE ONCE DAILY   albuterol (VENTOLIN HFA) 108 (90 Base) MCG/ACT inhaler Inhale 1-2 puffs into the lungs every 6 (six) hours as needed for wheezing or shortness of breath.   Calcium Citrate (CITRACAL PO) Take by mouth.   denosumab (PROLIA) 60 MG/ML SOSY injection Inject 60 mg into the skin every 6 (six) months.   meloxicam (MOBIC) 7.5 MG tablet TAKE 1 TABLET BY MOUTH EVERY DAY   metFORMIN (GLUCOPHAGE) 500 MG tablet Take 0.5 tablets (250 mg total) by mouth daily with breakfast.   Multiple Vitamins-Minerals (MULTIVITAMIN ADULT EXTRA C PO) multivitamin  Take 1 tablet as directed by oral route   Turmeric (QC TUMERIC COMPLEX PO) Take by mouth.   No facility-administered medications prior to visit.    Reviewed past medical and social history.  Review of Systems  Constitutional:  Negative for chills and diaphoresis.  HENT:  Positive for congestion, sinus pressure, sneezing and sore throat. Negative for ear pain and hoarse voice.   Respiratory:  Positive for cough. Negative for shortness  of breath.   Musculoskeletal:  Negative for neck pain.  Neurological:  Positive for headaches.   Per HPI     Objective:    Physical Exam Vitals and nursing note reviewed.  Constitutional:      General: She is not in acute distress. HENT:     Right Ear: Tympanic membrane, ear canal and external ear normal.     Left Ear: Tympanic membrane, ear canal and external ear normal.     Nose: Congestion and rhinorrhea present. No nasal tenderness or mucosal edema.     Right Nostril: Occlusion present.     Left Nostril: Occlusion present.     Right Turbinates: Enlarged. Not swollen or pale.     Left Turbinates: Enlarged. Not swollen or pale.     Right Sinus: Maxillary sinus tenderness and frontal sinus tenderness present.     Left Sinus: Maxillary sinus tenderness and frontal sinus tenderness present.     Mouth/Throat:     Pharynx: Oropharynx is clear. Uvula midline. Posterior oropharyngeal erythema present.     Tonsils: No tonsillar exudate or tonsillar abscesses.  Eyes:     Extraocular Movements: Extraocular movements intact.     Conjunctiva/sclera: Conjunctivae normal.  Cardiovascular:     Rate and Rhythm: Normal rate and regular rhythm.     Pulses: Normal pulses.     Heart sounds: Normal heart sounds.  Pulmonary:     Effort: Pulmonary effort is normal.     Breath sounds: Normal breath sounds.  Musculoskeletal:     Cervical  back: Normal range of motion and neck supple.     Right lower leg: No edema.     Left lower leg: No edema.  Lymphadenopathy:     Cervical: No cervical adenopathy.  Neurological:     Mental Status: She is alert and oriented to person, place, and time.    BP 112/77   Pulse 79   Wt 145 lb (65.8 kg)   SpO2 98%   BMI 24.89 kg/m    No results found for any visits on 05/06/23.      Assessment & Plan:   Problem List Items Addressed This Visit   None Visit Diagnoses     Acute non-recurrent maxillary sinusitis    -  Primary   Relevant Medications    azithromycin (ZITHROMAX Z-PAK) 250 MG tablet   Other Relevant Orders   POC Influenza A&B (Binax test)   POC COVID-19 BinaxNow      Meds ordered this encounter  Medications   azithromycin (ZITHROMAX Z-PAK) 250 MG tablet    Sig: Take 1 tablet (250 mg total) by mouth daily. Take 2tabs on first day, then 1tab once a day till complete    Dispense:  6 tablet    Refill:  0    Order Specific Question:   Supervising Provider    Answer:   Nadene Rubins ALFRED [[id number]]   Flonase and Afrin use: apply 1spray of afrin in each nare, wait , then apply 2sprays of flonase in each nare. Use both nasal spray consecutively x 3days, then flonase only for at least 14days. Encourage adequate oral hydration. Use mucinex DM or Robitussin  or delsym for cough without congestion  You can use plain "Tylenol" or "Advil" for fever, chills and achyness. Use cool mist humidifier at bedtime to help with nasal congestion and cough.  Return for maintain upcoming appt.    Alysia Penna, NP

## 2023-05-06 NOTE — Patient Instructions (Addendum)
Flonase and Afrin use: apply 1spray of afrin in each nare, wait , then apply 2sprays of flonase in each nare. Use both nasal spray consecutively x 3days, then flonase only for at least 14days.  Encourage adequate oral hydration. Use mucinex DM or Robitussin  or delsym for cough without congestion  You can use plain "Tylenol" or "Advil" for fever, chills and achyness. Use cool mist humidifier at bedtime to help with nasal congestion and cough.

## 2023-05-22 ENCOUNTER — Ambulatory Visit (INDEPENDENT_AMBULATORY_CARE_PROVIDER_SITE_OTHER): Payer: Federal, State, Local not specified - PPO | Admitting: Nurse Practitioner

## 2023-05-22 ENCOUNTER — Encounter: Payer: Self-pay | Admitting: Nurse Practitioner

## 2023-05-22 VITALS — BP 108/70 | HR 78 | Temp 97.7°F | Ht 64.0 in | Wt 138.8 lb

## 2023-05-22 DIAGNOSIS — Z23 Encounter for immunization: Secondary | ICD-10-CM

## 2023-05-22 DIAGNOSIS — G8929 Other chronic pain: Secondary | ICD-10-CM

## 2023-05-22 DIAGNOSIS — M25552 Pain in left hip: Secondary | ICD-10-CM | POA: Diagnosis not present

## 2023-05-22 DIAGNOSIS — E785 Hyperlipidemia, unspecified: Secondary | ICD-10-CM

## 2023-05-22 DIAGNOSIS — Z7984 Long term (current) use of oral hypoglycemic drugs: Secondary | ICD-10-CM

## 2023-05-22 DIAGNOSIS — E1165 Type 2 diabetes mellitus with hyperglycemia: Secondary | ICD-10-CM | POA: Diagnosis not present

## 2023-05-22 DIAGNOSIS — F5104 Psychophysiologic insomnia: Secondary | ICD-10-CM | POA: Diagnosis not present

## 2023-05-22 DIAGNOSIS — E559 Vitamin D deficiency, unspecified: Secondary | ICD-10-CM

## 2023-05-22 DIAGNOSIS — E1169 Type 2 diabetes mellitus with other specified complication: Secondary | ICD-10-CM

## 2023-05-22 DIAGNOSIS — M810 Age-related osteoporosis without current pathological fracture: Secondary | ICD-10-CM

## 2023-05-22 LAB — CBC
HCT: 39.9 % (ref 36.0–46.0)
Hemoglobin: 12.7 g/dL (ref 12.0–15.0)
MCHC: 31.8 g/dL (ref 30.0–36.0)
MCV: 84.2 fL (ref 78.0–100.0)
Platelets: 329 10*3/uL (ref 150.0–400.0)
RBC: 4.73 Mil/uL (ref 3.87–5.11)
RDW: 13.9 % (ref 11.5–15.5)
WBC: 5 10*3/uL (ref 4.0–10.5)

## 2023-05-22 LAB — COMPREHENSIVE METABOLIC PANEL
ALT: 14 U/L (ref 0–35)
AST: 18 U/L (ref 0–37)
Albumin: 4.5 g/dL (ref 3.5–5.2)
Alkaline Phosphatase: 70 U/L (ref 39–117)
BUN: 15 mg/dL (ref 6–23)
CO2: 27 meq/L (ref 19–32)
Calcium: 10 mg/dL (ref 8.4–10.5)
Chloride: 102 meq/L (ref 96–112)
Creatinine, Ser: 0.59 mg/dL (ref 0.40–1.20)
GFR: 96.72 mL/min (ref >60.00)
Glucose, Bld: 142 mg/dL — ABNORMAL HIGH (ref 70–99)
Potassium: 4.1 meq/L (ref 3.5–5.1)
Sodium: 138 meq/L (ref 135–145)
Total Bilirubin: 0.5 mg/dL (ref 0.2–1.2)
Total Protein: 7.2 g/dL (ref 6.0–8.3)

## 2023-05-22 LAB — VITAMIN D 25 HYDROXY (VIT D DEFICIENCY, FRACTURES): VITD: 37.07 ng/mL (ref 30.00–100.00)

## 2023-05-22 LAB — LIPID PANEL
Cholesterol: 186 mg/dL (ref 0–200)
HDL: 57 mg/dL (ref >39.00)
LDL Cholesterol: 96 mg/dL (ref 0–99)
NonHDL: 129.39
Total CHOL/HDL Ratio: 3
Triglycerides: 166 mg/dL — ABNORMAL HIGH (ref 0.0–149.0)
VLDL: 33.2 mg/dL (ref 0.0–40.0)

## 2023-05-22 LAB — HEMOGLOBIN A1C: Hgb A1c MFr Bld: 7.2 % — ABNORMAL HIGH (ref 4.6–6.5)

## 2023-05-22 LAB — TSH: TSH: 4.57 u[IU]/mL (ref 0.35–5.50)

## 2023-05-22 MED ORDER — DENOSUMAB 60 MG/ML ~~LOC~~ SOSY: 60.0000 mg | PREFILLED_SYRINGE | SUBCUTANEOUS | 1 refills | Status: DC

## 2023-05-22 MED ORDER — PRAVASTATIN SODIUM 20 MG PO TABS
20.0000 mg | ORAL_TABLET | Freq: Every evening | ORAL | 1 refills | Status: DC
Start: 2023-05-22 — End: 2024-01-01

## 2023-05-22 MED ORDER — METFORMIN HCL 500 MG PO TABS: 500.0000 mg | ORAL_TABLET | Freq: Two times a day (BID) | ORAL | 1 refills | Status: DC

## 2023-05-22 NOTE — Progress Notes (Signed)
Established Patient Visit  Patient: Breanna Martinez   DOB: 1961-05-11   62 y.o. Female  MRN: [id number] Visit Date: 05/22/2023  Subjective:    Chief Complaint  Patient presents with   Follow-up   Fatigue   HPI Chronic left hip pain Improved hip pain with outpt PT, walking and stretch exercise.  Chronic insomnia Ongoing for several years, associated with daytime fatigue Has Difficulty falling asleep due to racing thoughts She works 2nd shift, so bedtime is at 12am, it takes 2-3hrs to fall asleep, she hakes 3-4hrs naps on her days off. No ALCOHOL at bedtime. Coffee: 1cup in AM  Advised to try melatonin or calm supplement OVER THE COUNTER, and to avoid use of electronics at bedtime.  Vitamin D deficiency Repeat Vit. D 1000IU daily OTC  Osteoporosis Unsure why prolia was not dispensed by pharmacy. Sent message to initiate PA Maintain calcium and vit. D supplement  DM (diabetes mellitus) (HCC) Home glucose: 110-120 Eye exam completed by Dr. Fawn Kirk, report requested. No adverse effects with metformin 500mg  Normal UACr Ne neuropathy.  Repeat lipid panel, CMP and hgbA1c. hgbA1c remains at 7.2%, increased metformin to 500mg  BID LDL at 96, ASCVD risk at 8.5% start pravastatin 20mg  in PM F/up in 3months  Hyperlipidemia associated with type 2 diabetes mellitus (HCC) Repeat lipid panel: LDL at 96, ASCVD risk at 8.5% start pravastatin 20mg  in PM F/up in 3months    Wt Readings from Last 3 Encounters:  05/22/23 138 lb 12.8 oz (63 kg)  05/06/23 145 lb (65.8 kg)  02/19/23 143 lb (64.9 kg)    BP Readings from Last 3 Encounters:  05/22/23 108/70  05/06/23 112/77  02/19/23 102/70    Reviewed medical, surgical, and social history today  Medications: Outpatient Medications Prior to Visit  Medication Sig   ACCU-CHEK GUIDE test strip CHECK BLOOD GLUCOSE ONCE DAILY   albuterol (VENTOLIN HFA) 108 (90 Base) MCG/ACT inhaler Inhale 1-2 puffs into the  lungs every 6 (six) hours as needed for wheezing or shortness of breath.   Calcium Citrate (CITRACAL PO) Take by mouth.   meloxicam (MOBIC) 7.5 MG tablet TAKE 1 TABLET BY MOUTH EVERY DAY   Multiple Vitamins-Minerals (MULTIVITAMIN ADULT EXTRA C PO) multivitamin  Take 1 tablet as directed by oral route   Turmeric (QC TUMERIC COMPLEX PO) Take by mouth.   [DISCONTINUED] metFORMIN (GLUCOPHAGE) 500 MG tablet Take 0.5 tablets (250 mg total) by mouth daily with breakfast.   [DISCONTINUED] azithromycin (ZITHROMAX Z-PAK) 250 MG tablet Take 1 tablet (250 mg total) by mouth daily. Take 2tabs on first day, then 1tab once a day till complete   [DISCONTINUED] denosumab (PROLIA) 60 MG/ML SOSY injection Inject 60 mg into the skin every 6 (six) months. (Patient not taking: Reported on 05/22/2023)   No facility-administered medications prior to visit.   Reviewed past medical and social history.   ROS per HPI above  Last metabolic panel Lab Results  Component Value Date   GLUCOSE 142 (H) 05/22/2023   NA 138 05/22/2023   K 4.1 05/22/2023   CL 102 05/22/2023   CO2 27 05/22/2023   BUN 15 05/22/2023   CREATININE 0.59 05/22/2023   GFR 96.72 05/22/2023   CALCIUM 10.0 05/22/2023   PHOS 3.2 08/13/2022   PROT 7.2 05/22/2023   ALBUMIN 4.5 05/22/2023   LABGLOB 2.7 06/01/2022   AGRATIO 1.6 06/01/2022   BILITOT 0.5 05/22/2023   ALKPHOS  70 05/22/2023   AST 18 05/22/2023   ALT 14 05/22/2023   Last lipids Lab Results  Component Value Date   CHOL 186 05/22/2023   HDL 57.00 05/22/2023   LDLCALC 96 05/22/2023   TRIG 166.0 (H) 05/22/2023   CHOLHDL 3 05/22/2023   Last hemoglobin A1c Lab Results  Component Value Date   HGBA1C 7.2 (H) 05/22/2023   Last thyroid functions Lab Results  Component Value Date   TSH 4.57 05/22/2023   Last vitamin D Lab Results  Component Value Date   VD25OH 37.07 05/22/2023        Objective:  BP 108/70   Pulse 78   Temp 97.7 F (36.5 C) (Temporal)   Ht 5\' 4"   (1.626 m)   Wt 138 lb 12.8 oz (63 kg)   SpO2 96%   BMI 23.82 kg/m      Physical Exam Vitals and nursing note reviewed.  Cardiovascular:     Rate and Rhythm: Normal rate and regular rhythm.     Pulses: Normal pulses.     Heart sounds: Normal heart sounds.  Pulmonary:     Effort: Pulmonary effort is normal.     Breath sounds: Normal breath sounds.  Musculoskeletal:     Right lower leg: No edema.     Left lower leg: No edema.  Neurological:     Mental Status: She is alert and oriented to person, place, and time.     Results for orders placed or performed in visit on 05/22/23  CBC  Result Value Ref Range   WBC 5.0 4.0 - 10.5 K/uL   RBC 4.73 3.87 - 5.11 Mil/uL   Platelets 329.0 150.0 - 400.0 K/uL   Hemoglobin 12.7 12.0 - 15.0 g/dL   HCT 62.1 30.8 - 65.7 %   MCV 84.2 78.0 - 100.0 fl   MCHC 31.8 30.0 - 36.0 g/dL   RDW 84.6 96.2 - 95.2 %  Comprehensive metabolic panel  Result Value Ref Range   Sodium 138 135 - 145 mEq/L   Potassium 4.1 3.5 - 5.1 mEq/L   Chloride 102 96 - 112 mEq/L   CO2 27 19 - 32 mEq/L   Glucose, Bld 142 (H) 70 - 99 mg/dL   BUN 15 6 - 23 mg/dL   Creatinine, Ser 8.41 0.40 - 1.20 mg/dL   Total Bilirubin 0.5 0.2 - 1.2 mg/dL   Alkaline Phosphatase 70 39 - 117 U/L   AST 18 0 - 37 U/L   ALT 14 0 - 35 U/L   Total Protein 7.2 6.0 - 8.3 g/dL   Albumin 4.5 3.5 - 5.2 g/dL   GFR 32.44 >01.02 mL/min   Calcium 10.0 8.4 - 10.5 mg/dL  Lipid panel  Result Value Ref Range   Cholesterol 186 0 - 200 mg/dL   Triglycerides 725.3 (H) 0.0 - 149.0 mg/dL   HDL 66.44 >03.47 mg/dL   VLDL 42.5 0.0 - 95.6 mg/dL   LDL Cholesterol 96 0 - 99 mg/dL   Total CHOL/HDL Ratio 3    NonHDL 129.39   Hemoglobin A1c  Result Value Ref Range   Hgb A1c MFr Bld 7.2 (H) 4.6 - 6.5 %  TSH  Result Value Ref Range   TSH 4.57 0.35 - 5.50 uIU/mL  VITAMIN D 25 Hydroxy (Vit-D Deficiency, Fractures)  Result Value Ref Range   VITD 37.07 30.00 - 100.00 ng/mL      Assessment & Plan:    Problem  List Items Addressed This Visit  Chronic insomnia    Ongoing for several years, associated with daytime fatigue Has Difficulty falling asleep due to racing thoughts She works 2nd shift, so bedtime is at 12am, it takes 2-3hrs to fall asleep, she hakes 3-4hrs naps on her days off. No ALCOHOL at bedtime. Coffee: 1cup in AM  Advised to try melatonin or calm supplement OVER THE COUNTER, and to avoid use of electronics at bedtime.      Relevant Orders   CBC (Completed)   Comprehensive metabolic panel (Completed)   TSH (Completed)   Chronic left hip pain    Improved hip pain with outpt PT, walking and stretch exercise.      DM (diabetes mellitus) (HCC) - Primary    Home glucose: 110-120 Eye exam completed by Dr. Fawn Kirk, report requested. No adverse effects with metformin 500mg  Normal UACr Ne neuropathy.  Repeat lipid panel, CMP and hgbA1c. hgbA1c remains at 7.2%, increased metformin to 500mg  BID LDL at 96, ASCVD risk at 8.5% start pravastatin 20mg  in PM F/up in 3months      Relevant Medications   metFORMIN (GLUCOPHAGE) 500 MG tablet   pravastatin (PRAVACHOL) 20 MG tablet   Other Relevant Orders   Lipid panel (Completed)   Hemoglobin A1c (Completed)   Hyperlipidemia associated with type 2 diabetes mellitus (HCC)    Repeat lipid panel: LDL at 96, ASCVD risk at 8.5% start pravastatin 20mg  in PM F/up in 3months      Relevant Medications   metFORMIN (GLUCOPHAGE) 500 MG tablet   pravastatin (PRAVACHOL) 20 MG tablet   Osteoporosis    Unsure why prolia was not dispensed by pharmacy. Sent message to initiate PA Maintain calcium and vit. D supplement      Relevant Medications   denosumab (PROLIA) 60 MG/ML SOSY injection   Vitamin D deficiency    Repeat Vit. D 1000IU daily OTC      Relevant Orders   VITAMIN D 25 Hydroxy (Vit-D Deficiency, Fractures) (Completed)   Other Visit Diagnoses     Immunization due       Relevant Orders   Flu vaccine trivalent PF, 6mos  and older(Flulaval,Afluria,Fluarix,Fluzone) (Completed)      Return in about 6 months (around 11/20/2023) for DM, hyperlipidemia (fasting).     Alysia Penna, NP

## 2023-05-22 NOTE — Assessment & Plan Note (Signed)
Repeat Vit. D 1000IU daily OTC

## 2023-05-22 NOTE — Assessment & Plan Note (Addendum)
Unsure why prolia was not dispensed by pharmacy. Sent message to initiate PA Maintain calcium and vit. D supplement

## 2023-05-22 NOTE — Assessment & Plan Note (Addendum)
Improved hip pain with outpt PT, walking and stretch exercise.

## 2023-05-22 NOTE — Assessment & Plan Note (Addendum)
Home glucose: 110-120 Eye exam completed by Dr. Fawn Kirk, report requested. No adverse effects with metformin 500mg  Normal UACr Ne neuropathy.  Repeat lipid panel, CMP and hgbA1c. hgbA1c remains at 7.2%, increased metformin to 500mg  BID LDL at 96, ASCVD risk at 8.5% start pravastatin 20mg  in PM F/up in 3months

## 2023-05-22 NOTE — Patient Instructions (Addendum)
Melatonin 5-10mg  or Calm (melatonin and magnesium) 1-2 tabs  at bedtime.  Go to lab  Insomnia Insomnia is a sleep disorder that makes it difficult to fall asleep or stay asleep. Insomnia can cause fatigue, low energy, difficulty concentrating, mood swings, and poor performance at work or school. There are three different ways to classify insomnia: Difficulty falling asleep. Difficulty staying asleep. Waking up too early in the morning. Any type of insomnia can be long-term (chronic) or short-term (acute). Both are common. Short-term insomnia usually lasts for 3 months or less. Chronic insomnia occurs at least three times a week for longer than 3 months. What are the causes? Insomnia may be caused by another condition, situation, or substance, such as: Having certain mental health conditions, such as anxiety and depression. Using caffeine, alcohol, tobacco, or drugs. Having gastrointestinal conditions, such as gastroesophageal reflux disease (GERD). Having certain medical conditions. These include: Asthma. Alzheimer's disease. Stroke. Chronic pain. An overactive thyroid gland (hyperthyroidism). Other sleep disorders, such as restless legs syndrome and sleep apnea. Menopause. Sometimes, the cause of insomnia may not be known. What increases the risk? Risk factors for insomnia include: Gender. Females are affected more often than males. Age. Insomnia is more common as people get older. Stress and certain medical and mental health conditions. Lack of exercise. Having an irregular work schedule. This may include working night shifts and traveling between different time zones. What are the signs or symptoms? If you have insomnia, the main symptom is having trouble falling asleep or having trouble staying asleep. This may lead to other symptoms, such as: Feeling tired or having low energy. Feeling nervous about going to sleep. Not feeling rested in the morning. Having trouble  concentrating. Feeling irritable, anxious, or depressed. How is this diagnosed? This condition may be diagnosed based on: Your symptoms and medical history. Your health care provider may ask about: Your sleep habits. Any medical conditions you have. Your mental health. A physical exam. How is this treated? Treatment for insomnia depends on the cause. Treatment may focus on treating an underlying condition that is causing the insomnia. Treatment may also include: Medicines to help you sleep. Counseling or therapy. Lifestyle adjustments to help you sleep better. Follow these instructions at home: Eating and drinking  Limit or avoid alcohol, caffeinated beverages, and products that contain nicotine and tobacco, especially close to bedtime. These can disrupt your sleep. Do not eat a large meal or eat spicy foods right before bedtime. This can lead to digestive discomfort that can make it hard for you to sleep. Sleep habits  Keep a sleep diary to help you and your health care provider figure out what could be causing your insomnia. Write down: When you sleep. When you wake up during the night. How well you sleep and how rested you feel the next day. Any side effects of medicines you are taking. What you eat and drink. Make your bedroom a dark, comfortable place where it is easy to fall asleep. Put up shades or blackout curtains to block light from outside. Use a white noise machine to block noise. Keep the temperature cool. Limit screen use before bedtime. This includes: Not watching TV. Not using your smartphone, tablet, or computer. Stick to a routine that includes going to bed and waking up at the same times every day and night. This can help you fall asleep faster. Consider making a quiet activity, such as reading, part of your nighttime routine. Try to avoid taking naps during the day  so that you sleep better at night. Get out of bed if you are still awake after 15 minutes of  trying to sleep. Keep the lights down, but try reading or doing a quiet activity. When you feel sleepy, go back to bed. General instructions Take over-the-counter and prescription medicines only as told by your health care provider. Exercise regularly as told by your health care provider. However, avoid exercising in the hours right before bedtime. Use relaxation techniques to manage stress. Ask your health care provider to suggest some techniques that may work well for you. These may include: Breathing exercises. Routines to release muscle tension. Visualizing peaceful scenes. Make sure that you drive carefully. Do not drive if you feel very sleepy. Keep all follow-up visits. This is important. Contact a health care provider if: You are tired throughout the day. You have trouble in your daily routine due to sleepiness. You continue to have sleep problems, or your sleep problems get worse. Get help right away if: You have thoughts about hurting yourself or someone else. Get help right away if you feel like you may hurt yourself or others, or have thoughts about taking your own life. Go to your nearest emergency room or: Call 911. Call the National Suicide Prevention Lifeline at (734)578-7731 or 988. This is open 24 hours a day. Text the Crisis Text Line at (405) 357-4347. Summary Insomnia is a sleep disorder that makes it difficult to fall asleep or stay asleep. Insomnia can be long-term (chronic) or short-term (acute). Treatment for insomnia depends on the cause. Treatment may focus on treating an underlying condition that is causing the insomnia. Keep a sleep diary to help you and your health care provider figure out what could be causing your insomnia. This information is not intended to replace advice given to you by your health care provider. Make sure you discuss any questions you have with your health care provider. Document Revised: 07/03/2021 Document Reviewed: 07/03/2021 Elsevier  Patient Education  2024 [employer].

## 2023-05-22 NOTE — Assessment & Plan Note (Signed)
Repeat lipid panel: LDL at 96, ASCVD risk at 8.5% start pravastatin 20mg  in PM F/up in 3months

## 2023-05-22 NOTE — Assessment & Plan Note (Signed)
Ongoing for several years, associated with daytime fatigue Has Difficulty falling asleep due to racing thoughts She works 2nd shift, so bedtime is at 12am, it takes 2-3hrs to fall asleep, she hakes 3-4hrs naps on her days off. No ALCOHOL at bedtime. Coffee: 1cup in AM  Advised to try melatonin or calm supplement OVER THE COUNTER, and to avoid use of electronics at bedtime.

## 2023-05-28 ENCOUNTER — Encounter: Payer: Self-pay | Admitting: Nurse Practitioner

## 2023-05-31 ENCOUNTER — Telehealth: Payer: Self-pay

## 2023-05-31 ENCOUNTER — Other Ambulatory Visit (HOSPITAL_COMMUNITY): Payer: Self-pay

## 2023-05-31 DIAGNOSIS — M810 Age-related osteoporosis without current pathological fracture: Secondary | ICD-10-CM

## 2023-05-31 NOTE — Telephone Encounter (Signed)
-----   Message from Oswald Hillock sent at 05/30/2023  9:23 AM EDT ----- Regarding: FW: Prolia  ----- Message ----- From: Anne Ng, NP Sent: 05/22/2023  10:40 AM EDT To: Rx Prior Auth Team Subject: Prolia                                         Please complete Prolia Prior authorization if needed.  CN

## 2023-05-31 NOTE — Telephone Encounter (Signed)
Prolia BIV added to previous encounter (09/27/22). Will route encounter back once benefit verification is complete.

## 2023-05-31 NOTE — Telephone Encounter (Signed)
Prolia VOB initiated via [email or web address] 

## 2023-06-04 ENCOUNTER — Other Ambulatory Visit: Payer: Self-pay | Admitting: Nurse Practitioner

## 2023-06-04 DIAGNOSIS — Z1231 Encounter for screening mammogram for malignant neoplasm of breast: Secondary | ICD-10-CM

## 2023-06-05 ENCOUNTER — Ambulatory Visit
Admission: RE | Admit: 2023-06-05 | Discharge: 2023-06-05 | Disposition: A | Discharge status: Home / Self Care | Payer: Federal, State, Local not specified - PPO | Source: Ambulatory Visit | Attending: Nurse Practitioner | Admitting: Nurse Practitioner

## 2023-06-05 DIAGNOSIS — Z1231 Encounter for screening mammogram for malignant neoplasm of breast: Secondary | ICD-10-CM | POA: Diagnosis not present

## 2023-06-07 ENCOUNTER — Other Ambulatory Visit (HOSPITAL_COMMUNITY): Payer: Self-pay

## 2023-06-07 NOTE — Telephone Encounter (Signed)
VM received 06/06/23 at 1249 from [employer] stating when trying to check coverage the DOB of September 20, 1960 that they were provided for the pt is incorrect. Instructed to return the call or fax corrected information to [phone].   After reviewing, DOB is actually correct. Will attempt to call [employer] during operational hours.

## 2023-06-10 NOTE — Telephone Encounter (Signed)
Called received from Syna with [employer]. States they do not have the correct DOB on file for this pt. Verified that the DOB of [birth year], which they have on file, is correct. Syna states she will attempt to run it again and call back with any further questions.

## 2023-06-18 ENCOUNTER — Other Ambulatory Visit (HOSPITAL_COMMUNITY): Payer: Self-pay

## 2023-06-18 NOTE — Telephone Encounter (Signed)
Patient was called and notified; she verbalized understanding

## 2023-06-18 NOTE — Telephone Encounter (Signed)
Pt ready for scheduling for PROLIA on or after : 06/18/23  Out-of-pocket cost due at time of visit: $540  Primary: [employer] Prolia co-insurance: 30% Admin fee co-insurance: 30%  Secondary: --- Prolia co-insurance:  Admin fee co-insurance:   Medical Benefit Details: Date Benefits were checked: 06/10/23 Deductible: NO/ Coinsurance: 30%/ Admin Fee: 30%  Prior Auth: N/A PA# Expiration Date:   # of doses approved:  Pharmacy benefit: Copay $--- (MUST FILL AT [facility]) If patient wants fill through the pharmacy benefit please send prescription to: [facility], and include estimated need by date in rx notes. Pharmacy will ship medication directly to the office.  Patient IS eligible for Prolia Copay Card. Copay Card can make patient's cost as little as $25. Link to apply: [email or web address]://www.[email or web address]  ** This summary of benefits is an estimation of the patient's out-of-pocket cost. Exact cost may very based on individual plan coverage.

## 2023-06-18 NOTE — Telephone Encounter (Signed)
Called [employer] to initiate PA. Per representative, PA is not required for medical. If filled through pharmacy, must be sent to [facility] and sent to the office

## 2023-07-05 ENCOUNTER — Other Ambulatory Visit: Payer: Self-pay | Admitting: Nurse Practitioner

## 2023-07-05 DIAGNOSIS — E1165 Type 2 diabetes mellitus with hyperglycemia: Secondary | ICD-10-CM

## 2023-07-10 ENCOUNTER — Encounter: Payer: Self-pay | Admitting: Nurse Practitioner

## 2023-07-10 ENCOUNTER — Ambulatory Visit: Payer: Federal, State, Local not specified - PPO | Admitting: Nurse Practitioner

## 2023-07-10 VITALS — BP 125/85 | HR 98 | Temp 98.2°F | Resp 18 | Ht 64.0 in | Wt 143.2 lb

## 2023-07-10 DIAGNOSIS — J069 Acute upper respiratory infection, unspecified: Secondary | ICD-10-CM | POA: Diagnosis not present

## 2023-07-10 DIAGNOSIS — J208 Acute bronchitis due to other specified organisms: Secondary | ICD-10-CM

## 2023-07-10 LAB — POC COVID19 BINAXNOW: SARS Coronavirus 2 Ag: NEGATIVE

## 2023-07-10 LAB — POCT INFLUENZA A/B
Influenza A, POC: NEGATIVE
Influenza B, POC: NEGATIVE

## 2023-07-10 MED ORDER — ALBUTEROL SULFATE HFA 108 (90 BASE) MCG/ACT IN AERS: 1.0000 | INHALATION_SPRAY | Freq: Four times a day (QID) | RESPIRATORY_TRACT | 0 refills | Status: DC | PRN

## 2023-07-10 MED ORDER — HYDROCODONE BIT-HOMATROP MBR 5-1.5 MG/5ML PO SOLN: 5.0000 mL | Freq: Four times a day (QID) | ORAL | 0 refills | Status: DC | PRN

## 2023-07-10 MED ORDER — DM-GUAIFENESIN ER 30-600 MG PO TB12: 1.0000 | ORAL_TABLET | Freq: Two times a day (BID) | ORAL | Status: DC | PRN

## 2023-07-10 NOTE — Patient Instructions (Signed)
URI Instructions: Flonase and Afrin use: apply 1spray of afrin in each nare, wait , then apply 2sprays of flonase in each nare. Use both nasal spray consecutively x 3days, then flonase only for at least 14days.  Avoid decongestants if you have high blood pressure. Encourage adequate oral hydration. Use over-the-counter  cold" medicine  such as Dayquil/nyquil for cough and congestion.  Use mucinex DM or Robitussin  or delsym for cough without congestion  You can use plain "Tylenol" or "Advil" for fever, chills and achyness. Use cool mist humidifier at bedtime to help with nasal congestion and cough.  Cold/cough medications may have tylenol or ibuprofen or guaifenesin or dextromethophan in them, so be careful not to take beyond the recommended dose for each of these medications.   "Common cold" symptoms are usually triggered by a virus.  The antibiotics are usually not necessary. On average, a" viral cold" illness may take 7-10 days to resolve. Please, make an appointment if you are not better or if you're worse.

## 2023-07-10 NOTE — Progress Notes (Signed)
Acute Office Visit  Subjective:    Patient ID: Breanna Martinez, female    DOB: 12/30/1960, 62 y.o.   MRN: [id number]  Chief Complaint  Patient presents with   Acute Visit    PT C/O of congestion,coughing, sneezing, headache, sore throat and ear pressure since Sunday. PT used cough medication for symptoms.    URI  This is a new problem. The current episode started in the past 7 days. The problem has been unchanged. There has been no fever. Associated symptoms include congestion, coughing, headaches, joint pain, a plugged ear sensation, rhinorrhea, sinus pain, sneezing and a sore throat. Pertinent negatives include no abdominal pain, chest pain, diarrhea, dysuria, ear pain, joint swelling, nausea, neck pain, rash, swollen glands, vomiting or wheezing. Treatments tried: promethazine DM.  Sick contact at family gathering.  Outpatient Medications Prior to Visit  Medication Sig   ACCU-CHEK GUIDE test strip CHECK BLOOD GLUCOSE ONCE DAILY   Calcium Citrate (CITRACAL PO) Take by mouth.   denosumab (PROLIA) 60 MG/ML SOSY injection Inject 60 mg into the skin every 6 (six) months.   meloxicam (MOBIC) 7.5 MG tablet TAKE 1 TABLET BY MOUTH EVERY DAY   metFORMIN (GLUCOPHAGE) 500 MG tablet TAKE 1 TABLET BY MOUTH EVERY DAY WITH BREAKFAST   Multiple Vitamins-Minerals (MULTIVITAMIN ADULT EXTRA C PO) multivitamin  Take 1 tablet as directed by oral route   pravastatin (PRAVACHOL) 20 MG tablet Take 1 tablet (20 mg total) by mouth every evening.   Turmeric (QC TUMERIC COMPLEX PO) Take by mouth.   [DISCONTINUED] albuterol (VENTOLIN HFA) 108 (90 Base) MCG/ACT inhaler Inhale 1-2 puffs into the lungs every 6 (six) hours as needed for wheezing or shortness of breath.   No facility-administered medications prior to visit.    Reviewed past medical and social history.  Review of Systems  HENT:  Positive for congestion, rhinorrhea, sinus pain, sneezing and sore throat. Negative for ear pain.   Respiratory:   Positive for cough. Negative for wheezing.   Cardiovascular:  Negative for chest pain.  Gastrointestinal:  Negative for abdominal pain, diarrhea, nausea and vomiting.  Genitourinary:  Negative for dysuria.  Musculoskeletal:  Positive for joint pain. Negative for neck pain.  Skin:  Negative for rash.  Neurological:  Positive for headaches.   Per HPI     Objective:    Physical Exam Vitals and nursing note reviewed.  Constitutional:      General: She is not in acute distress. HENT:     Head:     Jaw: There is normal jaw occlusion.     Right Ear: Tympanic membrane, ear canal and external ear normal.     Left Ear: Tympanic membrane, ear canal and external ear normal.     Nose: Congestion and rhinorrhea present. No nasal tenderness or mucosal edema.     Right Nostril: No occlusion.     Left Nostril: No occlusion.     Right Turbinates: Swollen. Not enlarged or pale.     Left Turbinates: Swollen. Not enlarged or pale.     Right Sinus: No maxillary sinus tenderness or frontal sinus tenderness.     Left Sinus: No maxillary sinus tenderness or frontal sinus tenderness.     Mouth/Throat:     Pharynx: Oropharynx is clear. Uvula midline. Posterior oropharyngeal erythema present.     Tonsils: No tonsillar exudate or tonsillar abscesses.  Eyes:     Extraocular Movements: Extraocular movements intact.     Conjunctiva/sclera: Conjunctivae normal.  Cardiovascular:  Rate and Rhythm: Normal rate and regular rhythm.     Pulses: Normal pulses.     Heart sounds: Normal heart sounds.  Pulmonary:     Effort: Pulmonary effort is normal.     Breath sounds: Normal breath sounds.  Musculoskeletal:     Cervical back: Normal range of motion and neck supple.  Lymphadenopathy:     Cervical: No cervical adenopathy.  Neurological:     Mental Status: She is alert and oriented to person, place, and time.    BP 125/85 (BP Location: Left Arm, Patient Position: Sitting, Cuff Size: Normal)   Pulse 98    Temp 98.2 F (36.8 C) (Oral)   Resp 18   Ht 5\' 4"  (1.626 m)   Wt 143 lb 3.2 oz (65 kg)   SpO2 99%   BMI 24.58 kg/m    Results for orders placed or performed in visit on 07/10/23  POC COVID-19  Result Value Ref Range   SARS Coronavirus 2 Ag Negative Negative  POCT Influenza A/B  Result Value Ref Range   Influenza A, POC Negative Negative   Influenza B, POC Negative Negative       Assessment & Plan:   Problem List Items Addressed This Visit   None Visit Diagnoses     Viral upper respiratory tract infection    -  Primary   Relevant Medications   HYDROcodone bit-homatropine (HYCODAN) 5-1.5 MG/5ML syrup   dextromethorphan-guaiFENesin (MUCINEX DM) 30-600 MG 12hr tablet   albuterol (VENTOLIN HFA) 108 (90 Base) MCG/ACT inhaler   Other Relevant Orders   POC COVID-19 (Completed)   POCT Influenza A/B (Completed)   Acute bronchitis due to other specified organisms          Flonase and Afrin use: apply 1spray of afrin in each nare, wait , then apply 2sprays of flonase in each nare. Use both nasal spray consecutively x 3days, then flonase only for at least 14days.  Avoid decongestants if you have high blood pressure. Encourage adequate oral hydration. Use over-the-counter  cold" medicine  such as Dayquil/nyquil for cough and congestion.  Use mucinex DM or Robitussin  or delsym for cough without congestion  You can use plain "Tylenol" or "Advil" for fever, chills and achyness. Use cool mist humidifier at bedtime to help with nasal congestion and cough. Meds ordered this encounter  Medications   HYDROcodone bit-homatropine (HYCODAN) 5-1.5 MG/5ML syrup    Sig: Take 5 mLs by mouth every 6 (six) hours as needed for cough.    Dispense:  120 mL    Refill:  0    Order Specific Question:   Supervising Provider    Answer:   Mliss Sax [[id number]]   dextromethorphan-guaiFENesin (MUCINEX DM) 30-600 MG 12hr tablet    Sig: Take 1 tablet by mouth 2 (two) times daily as needed  for cough.    Order Specific Question:   Supervising Provider    Answer:   Mliss Sax [[id number]]   albuterol (VENTOLIN HFA) 108 (90 Base) MCG/ACT inhaler    Sig: Inhale 1-2 puffs into the lungs every 6 (six) hours as needed for wheezing or shortness of breath.    Dispense:  8 g    Refill:  0    Order Specific Question:   Supervising Provider    Answer:   Mliss Sax [[id number]]   Return if symptoms worsen or fail to improve.    Alysia Penna, NP

## 2023-07-12 ENCOUNTER — Telehealth: Payer: Self-pay | Admitting: Nurse Practitioner

## 2023-07-12 DIAGNOSIS — J0140 Acute pansinusitis, unspecified: Secondary | ICD-10-CM

## 2023-07-12 NOTE — Telephone Encounter (Signed)
Please call the patient regarding meds that was given to her

## 2023-07-12 NOTE — Telephone Encounter (Signed)
Called patient; no answer, left VM for call back

## 2023-07-15 MED ORDER — AZITHROMYCIN 250 MG PO TABS: 250.0000 mg | ORAL_TABLET | Freq: Every day | ORAL | 0 refills | Status: DC

## 2023-07-15 NOTE — Telephone Encounter (Signed)
Spoke to patient and advise her that new RX was sent to pharmacy and to take as prescribed; if symptoms don't improve within 5 days, to please call the clinic for follow up office visit. Patient verbalized understanding.

## 2023-07-15 NOTE — Addendum Note (Signed)
Addended by: Alysia Penna L on: 07/15/2023 08:19 AM   Modules accepted: Orders

## 2023-08-20 ENCOUNTER — Encounter: Payer: Self-pay | Admitting: Nurse Practitioner

## 2023-08-22 ENCOUNTER — Ambulatory Visit: Payer: Federal, State, Local not specified - PPO | Admitting: Nurse Practitioner

## 2023-08-22 ENCOUNTER — Encounter: Payer: Self-pay | Admitting: Nurse Practitioner

## 2023-08-22 VITALS — BP 100/75 | HR 73 | Temp 97.8°F | Resp 18 | Wt 137.2 lb

## 2023-08-22 DIAGNOSIS — M25511 Pain in right shoulder: Secondary | ICD-10-CM

## 2023-08-22 DIAGNOSIS — G8929 Other chronic pain: Secondary | ICD-10-CM | POA: Diagnosis not present

## 2023-08-22 DIAGNOSIS — Z7984 Long term (current) use of oral hypoglycemic drugs: Secondary | ICD-10-CM

## 2023-08-22 DIAGNOSIS — E1169 Type 2 diabetes mellitus with other specified complication: Secondary | ICD-10-CM | POA: Diagnosis not present

## 2023-08-22 DIAGNOSIS — J0100 Acute maxillary sinusitis, unspecified: Secondary | ICD-10-CM | POA: Diagnosis not present

## 2023-08-22 LAB — MICROALBUMIN / CREATININE URINE RATIO
Creatinine,U: 157.4 mg/dL
Microalb Creat Ratio: 0.7 mg/g (ref 0.0–30.0)
Microalb, Ur: 1.2 mg/dL (ref 0.0–1.9)

## 2023-08-22 LAB — POCT GLYCOSYLATED HEMOGLOBIN (HGB A1C): Hemoglobin A1C: 6.7 % — AB (ref 4.0–5.6)

## 2023-08-22 MED ORDER — DOXYCYCLINE HYCLATE 100 MG PO TABS: 100.0000 mg | ORAL_TABLET | Freq: Two times a day (BID) | ORAL | 0 refills | Status: AC

## 2023-08-22 MED ORDER — PREDNISONE 10 MG (21) PO TBPK: ORAL_TABLET | ORAL | 0 refills | Status: DC

## 2023-08-22 NOTE — Progress Notes (Signed)
Established Patient Visit  Patient: Breanna Martinez   DOB: 1961/03/01   63 y.o. Female  MRN: [id number] Visit Date: 08/22/2023  Subjective:    Chief Complaint  Patient presents with   Pain    PT C/O of right shoulder pain with decrease in mobility for 4 years; she used ice and heat for symptoms; letter with restrictions for work is needed, due to lifting, pushing and pulling heavy objects; she also expressed decrease in smell after URI 1 month ago.    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 She is right hand dorminant S/p right shoulder rotator cuff repair 54yrs ago Use of voltaren gel prn with no improvement.  Guarded ROM and trapezius muscle spasm noted, no joint effusion or swelling or deformity or erythema or muscle atrophy. Sent oral prednisone and muscle relaxant Provided work note with lifting restrictions. Advised to f/up with ortho if no improvement in 1week  DM (diabetes mellitus) (HCC) Normal foot exam Repeat hgbA1c: 6.7% controlled Repeat UACr: normal Maintain metformin dose  Reviewed medical, surgical, and social history today  Medications: Outpatient Medications Prior to Visit  Medication Sig   ACCU-CHEK GUIDE test strip CHECK BLOOD GLUCOSE ONCE DAILY   albuterol (VENTOLIN HFA) 108 (90 Base) MCG/ACT inhaler Inhale 1-2 puffs into the lungs every 6 (six) hours as needed for wheezing or shortness of breath.   Calcium Citrate (CITRACAL PO) Take by mouth.   denosumab (PROLIA) 60 MG/ML SOSY injection Inject 60 mg into the skin every 6 (six) months.   meloxicam (MOBIC) 7.5 MG tablet TAKE 1 TABLET BY MOUTH EVERY DAY   metFORMIN (GLUCOPHAGE) 500 MG tablet TAKE 1 TABLET BY MOUTH EVERY DAY WITH BREAKFAST   Multiple Vitamins-Minerals (MULTIVITAMIN ADULT EXTRA C PO) multivitamin  Take 1 tablet as directed by oral route   pravastatin (PRAVACHOL) 20 MG tablet Take 1 tablet  (20 mg total) by mouth every evening.   Turmeric (QC TUMERIC COMPLEX PO) Take by mouth.   [DISCONTINUED] azithromycin (ZITHROMAX Z-PAK) 250 MG tablet Take 1 tablet (250 mg total) by mouth daily. Take 2tabs on first day, then 1tab once a day till complete (Patient not taking: Reported on 08/22/2023)   [DISCONTINUED] dextromethorphan-guaiFENesin (MUCINEX DM) 30-600 MG 12hr tablet Take 1 tablet by mouth 2 (two) times daily as needed for cough. (Patient not taking: Reported on 08/22/2023)   [DISCONTINUED] HYDROcodone bit-homatropine (HYCODAN) 5-1.5 MG/5ML syrup Take 5 mLs by mouth every 6 (six) hours as needed for cough. (Patient not taking: Reported on 08/22/2023)   No facility-administered medications prior to visit.   Reviewed past medical and social history.   ROS per HPI above      Objective:  BP 100/75 (BP Location: Left Arm, Patient Position: Sitting, Cuff Size: Large)   Pulse 73   Temp 97.8 F (36.6 C) (Temporal)   Resp 18   Wt 137 lb 3.2 oz (62.2 kg)   SpO2 99%   BMI 23.55 kg/m      Physical Exam Vitals and nursing note reviewed.  Neck:     Thyroid: No thyroid mass, thyromegaly or thyroid tenderness.  Cardiovascular:     Rate and Rhythm: Normal rate and regular rhythm.     Pulses: Normal pulses.     Heart sounds: Normal heart sounds.  Pulmonary:     Effort: Pulmonary effort is  normal.     Breath sounds: Normal breath sounds.  Musculoskeletal:     Right shoulder: Tenderness present. No swelling, deformity, effusion, laceration, bony tenderness or crepitus. Decreased range of motion. Normal strength. Normal pulse.     Right upper arm: Normal.     Right elbow: Normal.     Cervical back: No rigidity. Pain with movement and muscular tenderness present. No spinous process tenderness. Normal range of motion.     Right lower leg: No edema.     Left lower leg: No edema.  Lymphadenopathy:     Cervical: No cervical adenopathy.  Neurological:     Mental Status: She is alert and  oriented to person, place, and time.     Results for orders placed or performed in visit on 08/22/23  Urine microalbumin-creatinine with uACR  Result Value Ref Range   Microalb, Ur 1.2 0.0 - 1.9 mg/dL   Creatinine,U 914.7 mg/dL   Microalb Creat Ratio 0.7 0.0 - 30.0 mg/g  POCT glycosylated hemoglobin (Hb A1C)  Result Value Ref Range   Hemoglobin A1C 6.7 (A) 4.0 - 5.6 %   HbA1c POC (<> result, manual entry)     HbA1c, POC (prediabetic range)     HbA1c, POC (controlled diabetic range)        Assessment & Plan:    Problem List Items Addressed This Visit     Chronic right shoulder pain   Acute on chronic pain due to repetitive lifting objects up to 100lbs at work-[employer] room. She is right hand dorminant S/p right shoulder rotator cuff repair 22yrs ago Use of voltaren gel prn with no improvement.  Guarded ROM and trapezius muscle spasm noted, no joint effusion or swelling or deformity or erythema or muscle atrophy. Sent oral prednisone and muscle relaxant Provided work note with lifting restrictions. Advised to f/up with ortho if no improvement in 1week      Relevant Medications   predniSONE (STERAPRED UNI-PAK 21 TAB) 10 MG (21) TBPK tablet   DM (diabetes mellitus) (HCC) - Primary   Normal foot exam Repeat hgbA1c: 6.7% controlled Repeat UACr: normal Maintain metformin dose      Relevant Orders   POCT glycosylated hemoglobin (Hb A1C) (Completed)   Urine microalbumin-creatinine with uACR (Completed)   Other Visit Diagnoses       Acute non-recurrent maxillary sinusitis       Relevant Medications   doxycycline (VIBRA-TABS) 100 MG tablet   predniSONE (STERAPRED UNI-PAK 21 TAB) 10 MG (21) TBPK tablet      Return in about 3 months (around 11/20/2023) for DM, hyperlipidemia (fasting).     Alysia Penna, NP

## 2023-08-22 NOTE — Patient Instructions (Addendum)
Go to lab hgbA1c has improved to 6.7% Schedule appointment with ortho if shoulder pain and ROM does not improve in 1week. Use warm compress as needed Continue voltaren gel 2-3x/day and/or tylenol 650mg  every 8hrs as needed for pain. Start oral prednisone dose pack Cal office if glucose >200

## 2023-08-22 NOTE — Assessment & Plan Note (Addendum)
Normal foot exam Repeat hgbA1c: 6.7% controlled Repeat UACr: normal Maintain metformin dose

## 2023-08-22 NOTE — Assessment & Plan Note (Signed)
Acute on chronic pain due to repetitive lifting objects up to 100lbs at work-[employer] room. She is right hand dorminant S/p right shoulder rotator cuff repair 32yrs ago Use of voltaren gel prn with no improvement.  Guarded ROM and trapezius muscle spasm noted, no joint effusion or swelling or deformity or erythema or muscle atrophy. Sent oral prednisone and muscle relaxant Provided work note with lifting restrictions. Advised to f/up with ortho if no improvement in 1week

## 2023-08-28 ENCOUNTER — Encounter: Payer: Self-pay | Admitting: Nurse Practitioner

## 2023-09-25 ENCOUNTER — Encounter: Payer: Self-pay | Admitting: Obstetrics and Gynecology

## 2023-09-25 ENCOUNTER — Other Ambulatory Visit (HOSPITAL_COMMUNITY): Payer: Self-pay

## 2023-09-25 ENCOUNTER — Telehealth: Payer: Self-pay

## 2023-09-25 ENCOUNTER — Other Ambulatory Visit (HOSPITAL_COMMUNITY)
Admission: RE | Admit: 2023-09-25 | Discharge: 2023-09-25 | Disposition: A | Discharge status: Home / Self Care | Payer: Federal, State, Local not specified - PPO | Source: Ambulatory Visit | Attending: Obstetrics and Gynecology | Admitting: Obstetrics and Gynecology

## 2023-09-25 ENCOUNTER — Ambulatory Visit (INDEPENDENT_AMBULATORY_CARE_PROVIDER_SITE_OTHER): Payer: Federal, State, Local not specified - PPO | Admitting: Obstetrics and Gynecology

## 2023-09-25 VITALS — BP 104/62 | HR 71 | Temp 97.8°F | Ht 64.5 in | Wt 134.0 lb

## 2023-09-25 DIAGNOSIS — Z01419 Encounter for gynecological examination (general) (routine) without abnormal findings: Secondary | ICD-10-CM | POA: Diagnosis not present

## 2023-09-25 DIAGNOSIS — Z1331 Encounter for screening for depression: Secondary | ICD-10-CM | POA: Diagnosis not present

## 2023-09-25 DIAGNOSIS — M810 Age-related osteoporosis without current pathological fracture: Secondary | ICD-10-CM

## 2023-09-25 DIAGNOSIS — Z124 Encounter for screening for malignant neoplasm of cervix: Secondary | ICD-10-CM | POA: Diagnosis not present

## 2023-09-25 MED ORDER — DENOSUMAB 60 MG/ML ~~LOC~~ SOSY: 60.0000 mg | PREFILLED_SYRINGE | SUBCUTANEOUS | 1 refills | Status: DC

## 2023-09-25 NOTE — Assessment & Plan Note (Signed)
 Cervical cancer screening performed according to ASCCP guidelines. Encouraged annual mammogram screening Colonoscopy UTD DXA due Labs and immunizations with her primary Encouraged safe sexual practices as indicated Encouraged healthy lifestyle practices with diet and exercise For patients under 50-63yo, I recommend 1200mg  calcium daily and 600IU of vitamin D daily.

## 2023-09-25 NOTE — Telephone Encounter (Signed)
Pharmacy Patient Advocate Encounter   Received notification from  [employer] Portal that prior authorization for Prolia is required/requested.   Insurance verification completed.   The patient is insured through [facility] .   Per test claim: PA required; PA submitted to above mentioned insurance via Phone Key/confirmation #/EOC -- Status is pending

## 2023-09-25 NOTE — Telephone Encounter (Signed)
Pharmacy Patient Advocate Encounter  Received notification from [facility] that Prior Authorization for Prolia has been APPROVED from 09/25/23 to 09/24/24   PA #/Case ID/Reference #: per the representative, fax will be sent over and this prior auth will cover Prolia at the specialty pharmacy. PA not required for office visit.

## 2023-09-25 NOTE — Telephone Encounter (Signed)
Pt ready for scheduling for Prolia on or after : 09/25/23  Out-of-pocket cost due at time of visit: $350  Number of injection/visits approved: 2  Primary: [employer] [place] - Commercial Prolia co-insurance: 30% Admin fee co-insurance: 30%  Secondary: N/A Prolia co-insurance:  Admin fee co-insurance:   Medical Benefit Details: Date Benefits were checked: 09/25/23 Deductible: no/ Coinsurance: 30%/ Admin Fee: 30%  Prior Auth: Approved (via specialty pharmacy) PA#  Expiration Date: 09/25/2023 to 09/24/2024  # of doses approved: 2  Pharmacy benefit: Copay $350 If patient wants fill through the pharmacy benefit please [facility] to[facility], and include estimated need by date in rx notes. Pharmacy will ship medication directly to the office.  Patient is eligible for Prolia Copay Card. Copay Card can make patient's cost as little as $25. Link to apply: [email or web address]://[email or web address]  ** This summary of benefits is an estimation of the patient's out-of-pocket cost. Exact cost may very based on individual plan coverage.

## 2023-09-25 NOTE — Patient Instructions (Signed)

## 2023-09-25 NOTE — Telephone Encounter (Signed)
 Prolia VOB initiated via [email or web address]

## 2023-09-25 NOTE — Progress Notes (Signed)
63 y.o. G2P1011 postmenopausal female with osteoporosis on Prolia (managed by her PCP) here for annual exam. Single.  1 daughter, 5 grandchildren.  Former Dr. Cherly Hensen patient.  No complaints today.  Thinks she missed her DEXA scan last year.  Has not been on Prolia for at least 6 months.  Postmenopausal bleeding: none Pelvic discharge or pain: none Breast mass, nipple discharge or skin changes : none Last PAP: No results found for: "DIAGPAP", "HPVHIGH", "ADEQPAP" Last mammogram: 06/05/2023 BI-RADS 1, density C Last colonoscopy: 08/25/2020 Last DXA: 02/17/2018 Sexually active: No Exercising: Yes, walk and bike Smoker: No  GYN HISTORY: No significant history  OB History  Gravida Para Term Preterm AB Living  2 1 1  1 1   SAB IAB Ectopic Multiple Live Births  1    1    # Outcome Date GA Lbr Len/2nd Weight Sex Type Anes PTL Lv  2 Term      Vag-Spont   LIV  1 SAB             Past Medical History:  Diagnosis Date   Allergy    seasonal allergies   Osteoporosis     Past Surgical History:  Procedure Laterality Date   BREAST BIOPSY Left 07/10/2021   fibroadenoma   rotator cuff surgery     TOE SURGERY      Current Outpatient Medications on File Prior to Visit  Medication Sig Dispense Refill   ACCU-CHEK GUIDE test strip CHECK BLOOD GLUCOSE ONCE DAILY 100 strip 5   albuterol (VENTOLIN HFA) 108 (90 Base) MCG/ACT inhaler Inhale 1-2 puffs into the lungs every 6 (six) hours as needed for wheezing or shortness of breath. 8 g 0   Calcium Citrate (CITRACAL PO) Take by mouth.     denosumab (PROLIA) 60 MG/ML SOSY injection Inject 60 mg into the skin every 6 (six) months. 1 mL 1   meloxicam (MOBIC) 7.5 MG tablet TAKE 1 TABLET BY MOUTH EVERY DAY 30 tablet 0   metFORMIN (GLUCOPHAGE) 500 MG tablet TAKE 1 TABLET BY MOUTH EVERY DAY WITH BREAKFAST 90 tablet 1   Multiple Vitamins-Minerals (MULTIVITAMIN ADULT EXTRA C PO) multivitamin  Take 1 tablet as directed by oral route     pravastatin  (PRAVACHOL) 20 MG tablet Take 1 tablet (20 mg total) by mouth every evening. 90 tablet 1   predniSONE (STERAPRED UNI-PAK 21 TAB) 10 MG (21) TBPK tablet As directed on package 21 tablet 0   Turmeric (QC TUMERIC COMPLEX PO) Take by mouth.     No current facility-administered medications on file prior to visit.    Social History   Socioeconomic History   Marital status: Single    Spouse name: Not on file   Number of children: Not on file   Years of education: Not on file   Highest education level: Associate degree: occupational, Scientist, product/process development, or vocational program  Occupational History   Not on file  Tobacco Use   Smoking status: Never   Smokeless tobacco: Never  Vaping Use   Vaping status: Never Used  Substance and Sexual Activity   Alcohol use: Yes    Comment: socially    Drug use: No   Sexual activity: Not Currently    Birth control/protection: Post-menopausal  Other Topics Concern   Not on file  Social History Narrative   1 daughter, 5 grandchildren   Social Drivers of Corporate investment banker Strain: Low Risk  (08/22/2023)   Overall Financial Resource Strain (CARDIA)  Difficulty of Paying Living Expenses: Not very hard  Food Insecurity: Food Insecurity Present (08/22/2023)   Hunger Vital Sign    Worried About Running Out of Food in the Last Year: Never true    Ran Out of Food in the Last Year: Often true  Transportation Needs: No Transportation Needs (05/18/2023)   PRAPARE - Administrator, Civil Service (Medical): No    Lack of Transportation (Non-Medical): No  Physical Activity: Inactive (05/18/2023)   Exercise Vital Sign    Days of Exercise per Week: 0 days    Minutes of Exercise per Session: 30 min  Stress: Stress Concern Present (05/18/2023)   [employer] of Occupational Health - Occupational Stress Questionnaire    Feeling of Stress : To some extent  Social Connections: Moderately Isolated (08/22/2023)   Social Connection and Isolation  Panel [NHANES]    Frequency of Communication with Friends and Family: More than three times a week    Frequency of Social Gatherings with Friends and Family: Three times a week    Attends Religious Services: More than 4 times per year    Active Member of Clubs or Organizations: No    Attends Engineer, structural: Not on file    Marital Status: Divorced  Catering manager Violence: Not on file    Family History  Problem Relation Age of Onset   Diabetes Mother    Glaucoma Mother    Prostate cancer Father    Colon cancer Neg Hx    Colon polyps Neg Hx    Esophageal cancer Neg Hx    Rectal cancer Neg Hx    Stomach cancer Neg Hx     Allergies  Allergen Reactions   Penicillins Anaphylaxis      PE Today's Vitals   09/25/23 0920  BP: 104/62  Pulse: 71  Temp: 97.8 F (36.6 C)  TempSrc: Oral  SpO2: 99%  Weight: 134 lb (60.8 kg)  Height: 5' 4.5" (1.638 m)   Body mass index is 22.65 kg/m.  Physical Exam Vitals reviewed. Exam conducted with a chaperone present.  Constitutional:      General: She is not in acute distress.    Appearance: Normal appearance.  HENT:     Head: Normocephalic and atraumatic.     Nose: Nose normal.  Eyes:     Extraocular Movements: Extraocular movements intact.     Conjunctiva/sclera: Conjunctivae normal.  Neck:     Thyroid: No thyroid mass, thyromegaly or thyroid tenderness.  Pulmonary:     Effort: Pulmonary effort is normal.  Chest:     Chest wall: No mass or tenderness.  Breasts:    Right: Normal. No swelling, mass, nipple discharge, skin change or tenderness.     Left: Normal. No swelling, mass, nipple discharge, skin change or tenderness.  Abdominal:     General: There is no distension.     Palpations: Abdomen is soft.     Tenderness: There is no abdominal tenderness.  Genitourinary:    General: Normal vulva.     Exam position: Lithotomy position.     Urethra: No prolapse.     Vagina: Normal. No vaginal discharge or  bleeding.     Cervix: Normal. No lesion.     Uterus: Normal. Not enlarged and not tender.      Adnexa: Right adnexa normal and left adnexa normal.  Musculoskeletal:        General: Normal range of motion.     Cervical back: Normal range  of motion.  Lymphadenopathy:     Upper Body:     Right upper body: No axillary adenopathy.     Left upper body: No axillary adenopathy.     Lower Body: No right inguinal adenopathy. No left inguinal adenopathy.  Skin:    General: Skin is warm and dry.  Neurological:     General: No focal deficit present.     Mental Status: She is alert.  Psychiatric:        Mood and Affect: Mood normal.        Behavior: Behavior normal.       Assessment and Plan:        Well woman exam with routine gynecological exam Assessment & Plan: Cervical cancer screening performed according to ASCCP guidelines. Encouraged annual mammogram screening Colonoscopy UTD DXA due Labs and immunizations with her primary Encouraged safe sexual practices as indicated Encouraged healthy lifestyle practices with diet and exercise For patients under 50-70yo, I recommend 1200mg  calcium daily and 600IU of vitamin D daily.    Cervical cancer screening -     Cytology - PAP  Age-related osteoporosis without current pathological fracture -     DG Bone Density; Future  Will message PCP to have patient f/u for medication management  Rosalyn Gess, MD

## 2023-09-25 NOTE — Telephone Encounter (Signed)
 Marland Kitchen

## 2023-09-25 NOTE — Addendum Note (Signed)
Addended by: Alysia Penna L on: 09/25/2023 11:29 AM   Modules accepted: Orders

## 2023-09-27 LAB — CYTOLOGY - PAP
Comment: NEGATIVE
High risk HPV: NEGATIVE

## 2023-09-27 NOTE — Telephone Encounter (Addendum)
Please advise. [facility] stating that prolia will be delivered to our office on 10/04/23. I contacted patient and advised her of message below regarding copay amount. Patient stated she spoke with specialty pharmacy and they stated that nothing was due for her copay. She agreed to schedule her prolia injection on 10/09/2023 at 2:20pm. How can we find the updated copay before the patient's appointment?

## 2023-10-02 ENCOUNTER — Other Ambulatory Visit: Payer: Self-pay | Admitting: *Deleted

## 2023-10-02 DIAGNOSIS — R87618 Other abnormal cytological findings on specimens from cervix uteri: Secondary | ICD-10-CM

## 2023-10-04 NOTE — Telephone Encounter (Addendum)
 Prolia injection arrived to the office. Pt NV scheduled for 10/09/23.

## 2023-10-05 DIAGNOSIS — J111 Influenza due to unidentified influenza virus with other respiratory manifestations: Secondary | ICD-10-CM | POA: Diagnosis not present

## 2023-10-05 DIAGNOSIS — R059 Cough, unspecified: Secondary | ICD-10-CM | POA: Diagnosis not present

## 2023-10-05 DIAGNOSIS — Z20822 Contact with and (suspected) exposure to covid-19: Secondary | ICD-10-CM | POA: Diagnosis not present

## 2023-10-08 ENCOUNTER — Ambulatory Visit (HOSPITAL_BASED_OUTPATIENT_CLINIC_OR_DEPARTMENT_OTHER)
Admission: RE | Admit: 2023-10-08 | Discharge: 2023-10-08 | Disposition: A | Discharge status: Home / Self Care | Payer: Federal, State, Local not specified - PPO | Source: Ambulatory Visit | Attending: Obstetrics and Gynecology | Admitting: Obstetrics and Gynecology

## 2023-10-08 DIAGNOSIS — Z13820 Encounter for screening for osteoporosis: Secondary | ICD-10-CM | POA: Insufficient documentation

## 2023-10-08 DIAGNOSIS — M810 Age-related osteoporosis without current pathological fracture: Secondary | ICD-10-CM | POA: Insufficient documentation

## 2023-10-08 DIAGNOSIS — Z780 Asymptomatic menopausal state: Secondary | ICD-10-CM | POA: Diagnosis not present

## 2023-10-09 ENCOUNTER — Ambulatory Visit: Payer: Federal, State, Local not specified - PPO

## 2023-10-09 DIAGNOSIS — M810 Age-related osteoporosis without current pathological fracture: Secondary | ICD-10-CM | POA: Diagnosis not present

## 2023-10-09 MED ORDER — DENOSUMAB 60 MG/ML ~~LOC~~ SOSY
60.0000 mg | PREFILLED_SYRINGE | Freq: Once | SUBCUTANEOUS | Status: AC
Start: 2023-10-23 — End: 2023-10-09
  Administered 2023-10-09: 60 mg via SUBCUTANEOUS

## 2023-10-09 NOTE — Progress Notes (Signed)
 Breanna Martinez is a 63 y.o. female presents to the office today for Prolia injection, per physician's orders Alysia Penna NP   60 mg/ml (dose), SUB Q (route) was administered in left arm today. Patient tolerated injection well with no redness or soreness at site.   Patient due for follow up labs/provider appt: Yes. Date due: 11/20/2023, appt made No Patient next injection due: 04/10/2024, (6 months)  appt made No  Reyna Lorenzi L Addison Freimuth

## 2023-10-14 ENCOUNTER — Ambulatory Visit: Payer: Federal, State, Local not specified - PPO | Admitting: Obstetrics and Gynecology

## 2023-10-14 ENCOUNTER — Encounter: Payer: Self-pay | Admitting: Obstetrics and Gynecology

## 2023-10-14 ENCOUNTER — Other Ambulatory Visit (HOSPITAL_COMMUNITY)
Admission: RE | Admit: 2023-10-14 | Discharge: 2023-10-14 | Disposition: A | Discharge status: Home / Self Care | Source: Ambulatory Visit | Attending: Obstetrics and Gynecology | Admitting: Obstetrics and Gynecology

## 2023-10-14 VITALS — BP 98/76 | HR 98 | Temp 97.8°F | Wt 136.0 lb

## 2023-10-14 DIAGNOSIS — R87618 Other abnormal cytological findings on specimens from cervix uteri: Secondary | ICD-10-CM

## 2023-10-14 DIAGNOSIS — R87619 Unspecified abnormal cytological findings in specimens from cervix uteri: Secondary | ICD-10-CM | POA: Insufficient documentation

## 2023-10-14 DIAGNOSIS — Z01812 Encounter for preprocedural laboratory examination: Secondary | ICD-10-CM

## 2023-10-14 DIAGNOSIS — N888 Other specified noninflammatory disorders of cervix uteri: Secondary | ICD-10-CM | POA: Diagnosis not present

## 2023-10-14 LAB — PREGNANCY, URINE: Preg Test, Ur: NEGATIVE

## 2023-10-14 NOTE — Patient Instructions (Signed)
 It is common to have vaginal bleeding and cramping for up to 72 hours after your biopsy. Please call our office with heavy vaginal bleeding, severe abdominal pain or fever. Avoid intercourse, tampon use, douching and baths for 7 days to decrease the risk of infection.

## 2023-10-14 NOTE — Assessment & Plan Note (Signed)
 Abnormal PAP results reviewed. ASCCP guidelines reviewed. Consents signed. Unsatisfactory colposcopy, ECC performed. Aftercare instructions provided.  Will call with results. I also recommend completion of Gardasil vaccine up to age 63yo, avoidance of smoking, and use of condoms with new sexual partners to prevent progression of disease.

## 2023-10-14 NOTE — Progress Notes (Signed)
 63 y.o. G2P1011 postmenopausal female with osteoporosis on Prolia (managed by her PCP)  here for colposcopy. Single. From [place] originally. 1 daughter, 5 grandchildren. Former Dr. Cherly Hensen patient.   No LMP recorded. Patient is postmenopausal. Reports in office cervical procedure for abnormal PAP in 1990s. Normal PAPs since. Not sexually active  Last PAP:    Component Value Date/Time   DIAGPAP - Atrophic pattern with epithelial atypia (A) 09/25/2023 0952   HPVHIGH Negative 09/25/2023 0952   ADEQPAP  09/25/2023 0952    Satisfactory for evaluation; transformation zone component PRESENT.   GYN HISTORY: No significant history   OB History  Gravida Para Term Preterm AB Living  2 1 1  1 1   SAB IAB Ectopic Multiple Live Births  1    1    # Outcome Date GA Lbr Len/2nd Weight Sex Type Anes PTL Lv  2 Term      Vag-Spont   LIV  1 SAB             Past Medical History:  Diagnosis Date   Allergy    seasonal allergies   Osteoporosis     Past Surgical History:  Procedure Laterality Date   BREAST BIOPSY Left 07/10/2021   fibroadenoma   rotator cuff surgery     TOE SURGERY      Current Outpatient Medications on File Prior to Visit  Medication Sig Dispense Refill   ACCU-CHEK GUIDE test strip CHECK BLOOD GLUCOSE ONCE DAILY 100 strip 5   albuterol (VENTOLIN HFA) 108 (90 Base) MCG/ACT inhaler Inhale 1-2 puffs into the lungs every 6 (six) hours as needed for wheezing or shortness of breath. 8 g 0   Calcium Citrate (CITRACAL PO) Take by mouth.     denosumab (PROLIA) 60 MG/ML SOSY injection Inject 60 mg into the skin every 6 (six) months. 1 mL 1   meloxicam (MOBIC) 7.5 MG tablet TAKE 1 TABLET BY MOUTH EVERY DAY 30 tablet 0   metFORMIN (GLUCOPHAGE) 500 MG tablet TAKE 1 TABLET BY MOUTH EVERY DAY WITH BREAKFAST 90 tablet 1   Multiple Vitamins-Minerals (MULTIVITAMIN ADULT EXTRA C PO) multivitamin  Take 1 tablet as directed by oral route     pravastatin (PRAVACHOL) 20 MG tablet Take  1 tablet (20 mg total) by mouth every evening. 90 tablet 1   predniSONE (STERAPRED UNI-PAK 21 TAB) 10 MG (21) TBPK tablet As directed on package 21 tablet 0   Turmeric (QC TUMERIC COMPLEX PO) Take by mouth.     No current facility-administered medications on file prior to visit.    Allergies  Allergen Reactions   Penicillins Anaphylaxis      PE Today's Vitals   10/14/23 1020  BP: 98/76  Pulse: 98  Temp: 97.8 F (36.6 C)  TempSrc: Oral  SpO2: 99%  Weight: 136 lb (61.7 kg)   Body mass index is 22.98 kg/m.  Physical Exam Vitals reviewed. Exam conducted with a chaperone present.  Constitutional:      General: She is not in acute distress.    Appearance: Normal appearance.  HENT:     Head: Normocephalic and atraumatic.     Nose: Nose normal.  Eyes:     Extraocular Movements: Extraocular movements intact.     Conjunctiva/sclera: Conjunctivae normal.  Pulmonary:     Effort: Pulmonary effort is normal.  Genitourinary:    General: Normal vulva.     Exam position: Lithotomy position.     Vagina: Normal. No vaginal discharge.  Cervix: Normal. No cervical motion tenderness, discharge or lesion.     Uterus: Normal. Not enlarged and not tender.      Adnexa: Right adnexa normal and left adnexa normal.  Musculoskeletal:        General: Normal range of motion.     Cervical back: Normal range of motion.  Neurological:     General: No focal deficit present.     Mental Status: She is alert.  Psychiatric:        Mood and Affect: Mood normal.        Behavior: Behavior normal.     Colposcopy Procedure Consented for procedure.  Time out performed. Speculum placed in vagina.  Acetic acid 3% was applied to cervix.  Unsatisfactory colposcopy.  Biopsies taken No. ECC performed Specimens to pathology. Good hemostasis.  Minimal EBL. No complications.  Tolerated well.     Assessment and Plan:        Other abnormal cytological finding of specimen from cervix Assessment &  Plan: Abnormal PAP results reviewed. ASCCP guidelines reviewed. Consents signed. Unsatisfactory colposcopy, ECC performed. Aftercare instructions provided.  Will call with results. I also recommend completion of Gardasil vaccine up to age 100yo, avoidance of smoking, and use of condoms with new sexual partners to prevent progression of disease.    Orders: -     Surgical pathology   Rosalyn Gess, MD

## 2023-10-14 NOTE — Addendum Note (Signed)
 Addended by: Tonita Phoenix E on: 10/14/2023 11:52 AM   Modules accepted: Orders

## 2023-10-17 LAB — SURGICAL PATHOLOGY

## 2023-10-22 ENCOUNTER — Encounter: Payer: Self-pay | Admitting: Nurse Practitioner

## 2023-12-25 ENCOUNTER — Ambulatory Visit: Admitting: Nurse Practitioner

## 2023-12-25 ENCOUNTER — Encounter: Payer: Self-pay | Admitting: Nurse Practitioner

## 2023-12-25 VITALS — BP 110/78 | HR 64 | Temp 96.6°F | Ht 64.0 in | Wt 132.6 lb

## 2023-12-25 DIAGNOSIS — J324 Chronic pansinusitis: Secondary | ICD-10-CM | POA: Diagnosis not present

## 2023-12-25 DIAGNOSIS — E1169 Type 2 diabetes mellitus with other specified complication: Secondary | ICD-10-CM

## 2023-12-25 DIAGNOSIS — E785 Hyperlipidemia, unspecified: Secondary | ICD-10-CM

## 2023-12-25 DIAGNOSIS — Z7984 Long term (current) use of oral hypoglycemic drugs: Secondary | ICD-10-CM | POA: Diagnosis not present

## 2023-12-25 LAB — COMPREHENSIVE METABOLIC PANEL WITH GFR
ALT: 13 U/L (ref 0–35)
AST: 18 U/L (ref 0–37)
Albumin: 4.6 g/dL (ref 3.5–5.2)
Alkaline Phosphatase: 49 U/L (ref 39–117)
BUN: 14 mg/dL (ref 6–23)
CO2: 27 meq/L (ref 19–32)
Calcium: 9.1 mg/dL (ref 8.4–10.5)
Chloride: 102 meq/L (ref 96–112)
Creatinine, Ser: 0.54 mg/dL (ref 0.40–1.20)
GFR: 98.39 mL/min (ref >60.00)
Glucose, Bld: 117 mg/dL — ABNORMAL HIGH (ref 70–99)
Potassium: 3.5 meq/L (ref 3.5–5.1)
Sodium: 137 meq/L (ref 135–145)
Total Bilirubin: 0.5 mg/dL (ref 0.2–1.2)
Total Protein: 7.5 g/dL (ref 6.0–8.3)

## 2023-12-25 LAB — LDL CHOLESTEROL, DIRECT: Direct LDL: 98 mg/dL

## 2023-12-25 LAB — HEMOGLOBIN A1C: Hgb A1c MFr Bld: 6.9 % — ABNORMAL HIGH (ref 4.6–6.5)

## 2023-12-25 MED ORDER — AZELASTINE-FLUTICASONE 137-50 MCG/ACT NA SUSP: 1.0000 | Freq: Two times a day (BID) | NASAL | 0 refills | Status: DC

## 2023-12-25 NOTE — Assessment & Plan Note (Signed)
 Persistent sinus congestion and loss of smell since 08/2023. No improvement with prednisone  dose pack and doxycycline . Associated with post nasal drainage and hoarseness Denies any dyspepsia or dysphagia or purulent mucus.  Start dymista and allegra Entered ENT referral

## 2023-12-25 NOTE — Assessment & Plan Note (Signed)
 Controlled with metformin  Average home glucose in last 90days: 106 No neuropathy or nephropathy or retinopathy No statin at this time.  Repeat lipid panel, BMP and hgbA1c Maintain med dose F/up in 3months

## 2023-12-25 NOTE — Patient Instructions (Addendum)
 Start flonase /azelastine nasal spray and allegra 180mg  daily. Go to lab

## 2023-12-25 NOTE — Progress Notes (Signed)
 Established Patient Visit  Patient: Breanna Martinez   DOB: January 06, 1961   63 y.o. Female  MRN: [id number] Visit Date: 12/25/2023  Subjective:     Chief Complaint  Patient presents with   Follow-up    3 month follow up   Breanna Martinez she can not smell and has a hoarse voice   HPI Chronic pansinusitis Persistent sinus congestion and loss of smell since 08/2023. No improvement with prednisone  dose pack and doxycycline . Associated with post nasal drainage and hoarseness Denies any dyspepsia or dysphagia or purulent mucus.  Start dymista and allegra Entered ENT referral  DM (diabetes mellitus) (HCC) Controlled with metformin  Average home glucose in last 90days: 106 No neuropathy or nephropathy or retinopathy No statin at this time.  Repeat lipid panel, BMP and hgbA1c Maintain med dose F/up in 3months High 123 Low 93 Average 106  Wt Readings from Last 3 Encounters:  12/25/23 132 lb 9.6 oz (60.1 kg)  10/14/23 136 lb (61.7 kg)  09/25/23 134 lb (60.8 kg)    BP Readings from Last 3 Encounters:  12/25/23 110/78  10/14/23 98/76  09/25/23 104/62    Reviewed medical, surgical, and social history today  Medications: Outpatient Medications Prior to Visit  Medication Sig   ACCU-CHEK GUIDE test strip CHECK BLOOD GLUCOSE ONCE DAILY   albuterol  (VENTOLIN  HFA) 108 (90 Base) MCG/ACT inhaler Inhale 1-2 puffs into the lungs every 6 (six) hours as needed for wheezing or shortness of breath.   Calcium Citrate (CITRACAL PO) Take by mouth.   denosumab  (PROLIA ) 60 MG/ML SOSY injection Inject 60 mg into the skin every 6 (six) months.   meloxicam  (MOBIC ) 7.5 MG tablet TAKE 1 TABLET BY MOUTH EVERY DAY   metFORMIN  (GLUCOPHAGE ) 500 MG tablet TAKE 1 TABLET BY MOUTH EVERY DAY WITH BREAKFAST   Multiple Vitamins-Minerals (MULTIVITAMIN ADULT EXTRA C PO) multivitamin  Take 1 tablet as directed by oral route   pravastatin  (PRAVACHOL ) 20 MG tablet Take 1 tablet (20 mg total) by  mouth every evening.   Turmeric (QC TUMERIC COMPLEX PO) Take by mouth.   [DISCONTINUED] predniSONE  (STERAPRED UNI-PAK 21 TAB) 10 MG (21) TBPK tablet As directed on package (Patient not taking: Reported on 12/25/2023)   No facility-administered medications prior to visit.   Reviewed past medical and social history.   ROS per HPI above      Objective:  BP 110/78 (BP Location: Left Arm, Patient Position: Sitting, Cuff Size: Small)   Pulse 64   Temp (!) 96.6 F (35.9 C) (Temporal)   Ht 5\' 4"  (1.626 m)   Wt 132 lb 9.6 oz (60.1 kg)   SpO2 99%   BMI 22.76 kg/m      Physical Exam Vitals and nursing note reviewed.  HENT:     Head:     Jaw: There is normal jaw occlusion.     Salivary Glands: Right salivary gland is not diffusely enlarged or tender. Left salivary gland is not diffusely enlarged or tender.     Nose: Mucosal edema present. No congestion or rhinorrhea.     Right Nostril: Occlusion present.     Left Nostril: Occlusion present.     Right Turbinates: Enlarged and swollen.     Left Turbinates: Enlarged and swollen.     Right Sinus: Maxillary sinus tenderness present. No frontal sinus tenderness.     Left Sinus: Maxillary sinus tenderness present. No frontal  sinus tenderness.     Mouth/Throat:     Pharynx: Oropharynx is clear. Uvula midline.     Tonsils: No tonsillar exudate.  Cardiovascular:     Rate and Rhythm: Normal rate and regular rhythm.     Pulses: Normal pulses.     Heart sounds: Normal heart sounds.  Pulmonary:     Effort: Pulmonary effort is normal.     Breath sounds: Normal breath sounds.  Neurological:     Mental Status: She is alert and oriented to person, place, and time.     No results found for any visits on 12/25/23.    Assessment & Plan:    Problem List Items Addressed This Visit     Chronic pansinusitis   Persistent sinus congestion and loss of smell since 08/2023. No improvement with prednisone  dose pack and doxycycline . Associated with  post nasal drainage and hoarseness Denies any dyspepsia or dysphagia or purulent mucus.  Start dymista and allegra Entered ENT referral      Relevant Medications   Azelastine-Fluticasone  137-50 MCG/ACT SUSP   Other Relevant Orders   Ambulatory referral to ENT   DM (diabetes mellitus) (HCC) - Primary   Controlled with metformin  Average home glucose in last 90days: 106 No neuropathy or nephropathy or retinopathy No statin at this time.  Repeat lipid panel, BMP and hgbA1c Maintain med dose F/up in 3months      Relevant Orders   Hemoglobin A1c   Comprehensive metabolic panel with GFR   Hyperlipidemia associated with type 2 diabetes mellitus (HCC)   Relevant Orders   Direct LDL   Return in about 3 months (around 03/26/2024) for DM, hyperlipidemia (fasting).     Kathrene Parents, NP

## 2023-12-26 ENCOUNTER — Ambulatory Visit: Payer: Self-pay | Admitting: Nurse Practitioner

## 2023-12-26 DIAGNOSIS — E1169 Type 2 diabetes mellitus with other specified complication: Secondary | ICD-10-CM

## 2023-12-26 DIAGNOSIS — E1165 Type 2 diabetes mellitus with hyperglycemia: Secondary | ICD-10-CM

## 2023-12-26 NOTE — Assessment & Plan Note (Signed)
 LDL not at goal: need to maintain a mediterranean diet. Increase pravastatin  dose to 40mg  in PM F/up in 3months

## 2023-12-26 NOTE — Assessment & Plan Note (Signed)
 hgbA1c at 6.9% increase metformin  to 500mg  BID with food F/up in 3months

## 2023-12-30 ENCOUNTER — Other Ambulatory Visit: Payer: Self-pay | Admitting: Nurse Practitioner

## 2023-12-30 DIAGNOSIS — E1165 Type 2 diabetes mellitus with hyperglycemia: Secondary | ICD-10-CM

## 2023-12-30 DIAGNOSIS — E1169 Type 2 diabetes mellitus with other specified complication: Secondary | ICD-10-CM

## 2024-01-02 ENCOUNTER — Other Ambulatory Visit: Payer: Self-pay | Admitting: Nurse Practitioner

## 2024-01-02 DIAGNOSIS — E1165 Type 2 diabetes mellitus with hyperglycemia: Secondary | ICD-10-CM

## 2024-01-02 MED ORDER — METFORMIN HCL 500 MG PO TABS: 500.0000 mg | ORAL_TABLET | Freq: Two times a day (BID) | ORAL | 1 refills | Status: DC

## 2024-01-09 ENCOUNTER — Encounter: Payer: Self-pay | Admitting: Nurse Practitioner

## 2024-01-22 ENCOUNTER — Other Ambulatory Visit: Payer: Self-pay | Admitting: Nurse Practitioner

## 2024-01-22 ENCOUNTER — Ambulatory Visit: Admitting: Orthopaedic Surgery

## 2024-01-22 DIAGNOSIS — J324 Chronic pansinusitis: Secondary | ICD-10-CM

## 2024-01-24 ENCOUNTER — Ambulatory Visit: Payer: Self-pay

## 2024-01-24 NOTE — Telephone Encounter (Signed)
 Copied from CRM [id number]. Topic: Clinical - Red Word Triage >> Jan 24, 2024  3:56 PM [place] E wrote: [facility] that prompted transfer to Nurse Triage: Worsening cough over the past 5 days along with a scratchy feeling in her chest.    Reason for Disposition  Cough  Answer Assessment - Initial Assessment Questions 1. ONSET: When did the cough begin?      4-5 days ago  2. SEVERITY: How bad is the cough today?      Moderate  3. SPUTUM: Describe the color of your sputum (none, dry cough; clear, white, yellow, green)     No 4. HEMOPTYSIS: Are you coughing up any blood? If so ask: How much? (flecks, streaks, tablespoons, etc.)     No 5. DIFFICULTY BREATHING: Are you having difficulty breathing? If Yes, ask: How bad is it? (e.g., mild, moderate, severe)    - MILD: No SOB at rest, mild SOB with walking, speaks normally in sentences, can lie down, no retractions, pulse < 100.    - MODERATE: SOB at rest, SOB with minimal exertion and prefers to sit, cannot lie down flat, speaks in phrases, mild retractions, audible wheezing, pulse 100-120.    - SEVERE: Very SOB at rest, speaks in single words, struggling to breathe, sitting hunched forward, retractions, pulse > 120      No 6. FEVER: Do you have a fever? If Yes, ask: What is your temperature, how was it measured, and when did it start?     No 7. CARDIAC HISTORY: Do you have any history of heart disease? (e.g., heart attack, congestive heart failure)      No 8. LUNG HISTORY: Do you have any history of lung disease?  (e.g., pulmonary embolus, asthma, emphysema)     No 9. PE RISK FACTORS: Do you have a history of blood clots? (or: recent major surgery, recent prolonged travel, bedridden)     No 10. OTHER SYMPTOMS: Do you have any other symptoms? (e.g., runny nose, wheezing, chest pain)       Chest pain with cough  Protocols used: Cough - Acute Non-Productive-A-AH   FYI Only or Action Required?: FYI only for  provider.  Patient was last seen in primary care on 12/25/2023 by [name], Connye Delaine, NP. Called Nurse Triage reporting Cough. Symptoms began several days ago. Symptoms are: gradually worsening.  Triage Disposition: Home Care Appointment made 6/23 per patient's request   Patient/caregiver understands and will follow disposition?: Yes

## 2024-01-27 ENCOUNTER — Other Ambulatory Visit: Payer: Self-pay | Admitting: Family Medicine

## 2024-01-27 ENCOUNTER — Ambulatory Visit: Payer: Self-pay | Admitting: Family Medicine

## 2024-01-27 ENCOUNTER — Ambulatory Visit (INDEPENDENT_AMBULATORY_CARE_PROVIDER_SITE_OTHER): Admitting: Family Medicine

## 2024-01-27 ENCOUNTER — Ambulatory Visit (INDEPENDENT_AMBULATORY_CARE_PROVIDER_SITE_OTHER)

## 2024-01-27 ENCOUNTER — Encounter: Payer: Self-pay | Admitting: Family Medicine

## 2024-01-27 VITALS — BP 96/60 | HR 79 | Temp 98.6°F | Ht 64.0 in | Wt 132.2 lb

## 2024-01-27 DIAGNOSIS — R918 Other nonspecific abnormal finding of lung field: Secondary | ICD-10-CM | POA: Diagnosis not present

## 2024-01-27 DIAGNOSIS — R053 Chronic cough: Secondary | ICD-10-CM

## 2024-01-27 DIAGNOSIS — R0989 Other specified symptoms and signs involving the circulatory and respiratory systems: Secondary | ICD-10-CM | POA: Diagnosis not present

## 2024-01-27 DIAGNOSIS — J189 Pneumonia, unspecified organism: Secondary | ICD-10-CM | POA: Insufficient documentation

## 2024-01-27 DIAGNOSIS — R059 Cough, unspecified: Secondary | ICD-10-CM | POA: Diagnosis not present

## 2024-01-27 LAB — POCT INFLUENZA A/B
Influenza A, POC: NEGATIVE
Influenza B, POC: NEGATIVE

## 2024-01-27 LAB — POC COVID19 BINAXNOW: SARS Coronavirus 2 Ag: NEGATIVE

## 2024-01-27 MED ORDER — BENZONATATE 100 MG PO CAPS: 100.0000 mg | ORAL_CAPSULE | Freq: Two times a day (BID) | ORAL | 0 refills | Status: DC | PRN

## 2024-01-27 MED ORDER — ESOMEPRAZOLE MAGNESIUM 40 MG PO CPDR: 40.0000 mg | DELAYED_RELEASE_CAPSULE | Freq: Every day | ORAL | 3 refills | Status: DC

## 2024-01-27 MED ORDER — LEVOFLOXACIN 500 MG PO TABS: 500.0000 mg | ORAL_TABLET | Freq: Every day | ORAL | 0 refills | Status: AC

## 2024-01-27 NOTE — Telephone Encounter (Signed)
 Noted. Patient has an appointment today with Dr. Sebastian.

## 2024-01-27 NOTE — Patient Instructions (Addendum)
  VISIT SUMMARY: Today, we discussed your ongoing issues with a chronic cough and congestion that you've been experiencing since December. We reviewed your symptoms, previous treatments, and potential causes. We also touched on your diabetes and chronic pancreatitis as part of your medical history.  YOUR PLAN: -CHRONIC COUGH: Chronic cough can be caused by various factors, including allergies, asthma, and reflux. Given your symptoms and lack of response to previous treatments, we are considering silent reflux as a potential cause. We will start you on omeprazole (Nexium) 40 mg once daily in the morning, 30 minutes to an hour before eating, for at least four weeks to see if it helps. Additionally, we will prescribe benzonatate  for symptomatic relief of your cough. A chest x-ray will be done at the sister facility on [facility] to rule out any underlying chest issues. Please call ahead to ensure availability. Follow up with ENT as previously referred.  INSTRUCTIONS: Please ensure you get the chest x-ray done at the sister facility on [facility]. Remember to call ahead to confirm equipment and staff availability. Continue taking the prescribed medications as directed and follow up with ENT as previously referred.  For  xray, go to:    [facility] [address] Christianna Hering [place], [place], [place] [address] Phone: [phone]

## 2024-01-27 NOTE — Progress Notes (Signed)
 Assessment & Plan   Assessment/Plan:     Assessment & Plan Chronic Cough Chronic cough since December with intermittent episodes of intense coughing, chest pain, and congestion. Symptoms are not associated with fever or chills. Previous treatments included prednisone , doxycycline , and nasal steroids, with limited relief. Differential diagnosis includes allergies, asthma, and laryngeal reflux. Lungs are clear on examination, and there is no significant shortness of breath outside of coughing episodes. Considering silent reflux as a potential cause, given the lack of response to typical treatments for respiratory issues. Discussed the possibility of silent reflux causing chronic rhinitis and cough due to acid irritation in the throat and lungs. Proposed treatment with antacids as a safe option to address potential laryngeal reflux, acknowledging it as a common but not primary cause of chronic cough. - Order chest x-ray at sister facility on [facility] to rule out any underlying chest pathology. It is a walk-in facility, but call ahead to ensure equipment and staff availability. - Prescribe esomeprazole (Nexium) 40 mg once daily in the morning, 30 minutes to an hour before eating, for at least four weeks to address potential laryngeal reflux. - Prescribe benzonatate  for symptomatic relief of cough. - Ensure follow-up with ENT as previously referred.        There are no discontinued medications.  Return if symptoms worsen or fail to improve, for cough.        Subjective:   Encounter date: 01/27/2024  Breanna Martinez is a 63 y.o. female who has Osteoporosis; DM (diabetes mellitus) (HCC); Vitamin D  deficiency; Pain in toes of both feet; Localized primary osteoarthritis of carpometacarpal (CMC) joint of right wrist; Chronic left hip pain; Vitreomacular adhesion of both eyes; Nuclear sclerotic cataract of both eyes; Hematochezia; Chronic insomnia; Hyperlipidemia associated with type 2  diabetes mellitus (HCC); Chronic right shoulder pain; Well woman exam with routine gynecological exam; Abnormal Pap smear of cervix; and Chronic pansinusitis on their problem list..   She  has a past medical history of Allergy and Osteoporosis..   She presents with chief complaint of Cough and chest congestion (Started last wed) .   Discussed the use of AI scribe software for clinical note transcription with the patient, who gave verbal consent to proceed.  History of Present Illness Breanna Martinez is a 63 year old female with diabetes and chronic pancreatitis who presents with recurrent cough and congestion.  She has been experiencing intermittent cough and congestion since last Wednesday, with symptoms initially beginning in December. Despite normal test results at that time, the symptoms have persisted. She initially attributed these symptoms to allergies and self-medicated, but they have continued to recur.  No fevers or chills, but she feels hot at times. Shortness of breath occurs only during intense coughing episodes, which start with chest pain and a dry, itchy sensation in her chest, leading to persistent coughing. She occasionally uses an albuterol  inhaler when feeling sick, but not regularly, as she does not experience constant shortness of breath.  She has been treated with prednisone  and doxycycline  in the past for post-nasal drainage and uses azelastine  nasal spray. She denies significant heartburn symptoms.       Past Surgical History:  Procedure Laterality Date   BREAST BIOPSY Left 07/10/2021   fibroadenoma   rotator cuff surgery     TOE SURGERY      Outpatient Medications Prior to Visit  Medication Sig Dispense Refill   ACCU-CHEK GUIDE test strip CHECK BLOOD GLUCOSE ONCE DAILY 100 strip 5  albuterol  (VENTOLIN  HFA) 108 (90 Base) MCG/ACT inhaler Inhale 1-2 puffs into the lungs every 6 (six) hours as needed for wheezing or shortness of breath. 8 g 0    Azelastine -Fluticasone  137-50 MCG/ACT SUSP PLACE 1 SPRAY INTO THE NOSE EVERY 12 (TWELVE) HOURS. 23 g 0   Calcium Citrate (CITRACAL PO) Take by mouth.     denosumab  (PROLIA ) 60 MG/ML SOSY injection Inject 60 mg into the skin every 6 (six) months. 1 mL 1   meloxicam  (MOBIC ) 7.5 MG tablet TAKE 1 TABLET BY MOUTH EVERY DAY 30 tablet 0   metFORMIN  (GLUCOPHAGE ) 500 MG tablet Take 1 tablet (500 mg total) by mouth 2 (two) times daily with a meal. 180 tablet 1   Multiple Vitamins-Minerals (MULTIVITAMIN ADULT EXTRA C PO) multivitamin  Take 1 tablet as directed by oral route     pravastatin  (PRAVACHOL ) 40 MG tablet Take 1 tablet (40 mg total) by mouth at bedtime. 90 tablet 3   Turmeric (QC TUMERIC COMPLEX PO) Take by mouth.     No facility-administered medications prior to visit.    Family History  Problem Relation Age of Onset   Diabetes Mother    Glaucoma Mother    Prostate cancer Father    Colon cancer Neg Hx    Colon polyps Neg Hx    Esophageal cancer Neg Hx    Rectal cancer Neg Hx    Stomach cancer Neg Hx     Social History   Socioeconomic History   Marital status: Single    Spouse name: Not on file   Number of children: Not on file   Years of education: Not on file   Highest education level: Associate degree: occupational, Scientist, product/process development, or vocational program  Occupational History   Not on file  Tobacco Use   Smoking status: Never   Smokeless tobacco: Never  Vaping Use   Vaping status: Never Used  Substance and Sexual Activity   Alcohol use: Yes    Comment: socially    Drug use: No   Sexual activity: Not Currently    Birth control/protection: Post-menopausal  Other Topics Concern   Not on file  Social History Narrative   1 daughter, 5 grandchildren   Social Drivers of Corporate investment banker Strain: Low Risk  (08/22/2023)   Overall Financial Resource Strain (CARDIA)    Difficulty of Paying Living Expenses: Not very hard  Food Insecurity: Food Insecurity Present  (08/22/2023)   Hunger Vital Sign    Worried About Running Out of Food in the Last Year: Never true    Ran Out of Food in the Last Year: Often true  Transportation Needs: No Transportation Needs (05/18/2023)   PRAPARE - Administrator, Civil Service (Medical): No    Lack of Transportation (Non-Medical): No  Physical Activity: Inactive (05/18/2023)   Exercise Vital Sign    Days of Exercise per Week: 0 days    Minutes of Exercise per Session: 30 min  Stress: Stress Concern Present (05/18/2023)   [employer] of Occupational Health - Occupational Stress Questionnaire    Feeling of Stress : To some extent  Social Connections: Moderately Isolated (08/22/2023)   Social Connection and Isolation Panel    Frequency of Communication with Friends and Family: More than three times a week    Frequency of Social Gatherings with Friends and Family: Three times a week    Attends Religious Services: More than 4 times per year    Active Member of Clubs  or Organizations: No    Attends Engineer, structural: Not on file    Marital Status: Divorced  Intimate Partner Violence: Not on file                                                                                                  Objective:  Physical Exam: BP 96/60   Pulse 79   Temp 98.6 F (37 C) (Temporal)   Ht 5' 4 (1.626 m)   Wt 132 lb 3.2 oz (60 kg)   SpO2 98%   BMI 22.69 kg/m    Physical Exam GENERAL: Alert, cooperative, well developed, no acute distress HEENT: Normocephalic, normal oropharynx, moist mucous membranes CHEST: Clear to auscultation bilaterally, no wheezes, rhonchi, or crackles CARDIOVASCULAR: Normal heart rate and rhythm, S1 and S2 normal without murmurs ABDOMEN: Soft, non-tender, non-distended, without organomegaly, normal bowel sounds EXTREMITIES: No cyanosis or edema NEUROLOGICAL: Cranial nerves grossly intact, moves all extremities without gross motor or sensory deficit     No results  found.  Recent Results (from the past 2160 hours)  Hemoglobin A1c     Status: Abnormal   Collection Time: 12/25/23 12:14 PM  Result Value Ref Range   Hgb A1c MFr Bld 6.9 (H) 4.6 - 6.5 %    Comment: Glycemic Control Guidelines for People with Diabetes:Non Diabetic:  <6%Goal of Therapy: <7%Additional Action Suggested:  >8%   Comprehensive metabolic panel with GFR     Status: Abnormal   Collection Time: 12/25/23 12:14 PM  Result Value Ref Range   Sodium 137 135 - 145 mEq/L   Potassium 3.5 3.5 - 5.1 mEq/L   Chloride 102 96 - 112 mEq/L   CO2 27 19 - 32 mEq/L   Glucose, Bld 117 (H) 70 - 99 mg/dL   BUN 14 6 - 23 mg/dL   Creatinine, Ser 9.45 0.40 - 1.20 mg/dL   Total Bilirubin 0.5 0.2 - 1.2 mg/dL   Alkaline Phosphatase 49 39 - 117 U/L   AST 18 0 - 37 U/L   ALT 13 0 - 35 U/L   Total Protein 7.5 6.0 - 8.3 g/dL   Albumin 4.6 3.5 - 5.2 g/dL   GFR 01.60 >39.99 mL/min    Comment: Calculated using the CKD-EPI Creatinine Equation (2021)   Calcium 9.1 8.4 - 10.5 mg/dL  Direct LDL     Status: None   Collection Time: 12/25/23 12:14 PM  Result Value Ref Range   Direct LDL 98.0 mg/dL    Comment: Optimal:  <899 mg/dLNear or Above Optimal:  100-129 mg/dLBorderline High:  130-159 mg/dLHigh:  160-189 mg/dLVery High:  >190 mg/dL        Beverley Adine Hummer, MD, MS

## 2024-01-31 ENCOUNTER — Encounter: Payer: Self-pay | Admitting: Nurse Practitioner

## 2024-01-31 ENCOUNTER — Ambulatory Visit: Admitting: Nurse Practitioner

## 2024-01-31 VITALS — BP 110/72 | HR 68 | Temp 98.1°F | Ht 64.0 in | Wt 130.4 lb

## 2024-01-31 DIAGNOSIS — J189 Pneumonia, unspecified organism: Secondary | ICD-10-CM | POA: Diagnosis not present

## 2024-01-31 MED ORDER — HYDROCODONE BIT-HOMATROP MBR 5-1.5 MG/5ML PO SOLN: 5.0000 mL | Freq: Three times a day (TID) | ORAL | 0 refills | Status: DC | PRN

## 2024-01-31 NOTE — Patient Instructions (Signed)
 Ok to hold nexium  Complete levofloxacin  as prescribed Use albuterol  and hycodan as prescribed Maintain adequate oral hydration, nutrition, and rest.

## 2024-01-31 NOTE — Progress Notes (Signed)
 Established Patient Visit  Patient: Breanna Martinez   DOB: [birth year]   63 y.o. Female  MRN: [id number] Visit Date: 01/31/2024  Subjective:    Chief Complaint  Patient presents with   Follow-up    Cough and discuss results for recent x- ray    HPI Community acquired pneumonia of left upper lobe of lung Diagnosed 01/27/2024, reports she started levaquin  500mg  yesterday.  She reports cough, chest tightness, and fatigue. O2 sat. 98%RA and HR 68  Advised to Complete levofloxacin  as prescribed Use albuterol  and hycodan as prescribed Maintain adequate oral hydration, nutrition, and rest. F/up in 1week  Wt Readings from Last 3 Encounters:  01/31/24 130 lb 6.4 oz (59.1 kg)  01/27/24 132 lb 3.2 oz (60 kg)  12/25/23 132 lb 9.6 oz (60.1 kg)    Reviewed medical, surgical, and social history today  Medications: Outpatient Medications Prior to Visit  Medication Sig   ACCU-CHEK GUIDE test strip CHECK BLOOD GLUCOSE ONCE DAILY   albuterol  (VENTOLIN  HFA) 108 (90 Base) MCG/ACT inhaler Inhale 1-2 puffs into the lungs every 6 (six) hours as needed for wheezing or shortness of breath.   Azelastine -Fluticasone  137-50 MCG/ACT SUSP PLACE 1 SPRAY INTO THE NOSE EVERY 12 (TWELVE) HOURS.   benzonatate  (TESSALON ) 100 MG capsule Take 1 capsule (100 mg total) by mouth 2 (two) times daily as needed for cough.   Calcium Citrate (CITRACAL PO) Take by mouth.   denosumab  (PROLIA ) 60 MG/ML SOSY injection Inject 60 mg into the skin every 6 (six) months.   esomeprazole  (NEXIUM ) 40 MG capsule TAKE 1 CAPSULE BY MOUTH EVERY DAY BEFORE BREAKFAST   levofloxacin  (LEVAQUIN ) 500 MG tablet Take 1 tablet (500 mg total) by mouth daily for 7 days.   meloxicam  (MOBIC ) 7.5 MG tablet TAKE 1 TABLET BY MOUTH EVERY DAY   metFORMIN  (GLUCOPHAGE ) 500 MG tablet Take 1 tablet (500 mg total) by mouth 2 (two) times daily with a meal.   Multiple Vitamins-Minerals (MULTIVITAMIN ADULT EXTRA C PO) multivitamin  Take 1  tablet as directed by oral route   pravastatin  (PRAVACHOL ) 40 MG tablet Take 1 tablet (40 mg total) by mouth at bedtime.   Turmeric (QC TUMERIC COMPLEX PO) Take by mouth.   No facility-administered medications prior to visit.   Reviewed past medical and social history.   ROS per HPI above      Objective:  BP 110/72 (BP Location: Left Arm, Patient Position: Sitting, Cuff Size: Normal)   Pulse 68   Temp 98.1 F (36.7 C) (Oral)   Ht 5' 4 (1.626 m)   Wt 130 lb 6.4 oz (59.1 kg)   SpO2 98%   BMI 22.38 kg/m      Physical Exam Vitals and nursing note reviewed.  Constitutional:      General: She is not in acute distress.    Appearance: She is ill-appearing.   Cardiovascular:     Rate and Rhythm: Normal rate and regular rhythm.     Pulses: Normal pulses.     Heart sounds: Normal heart sounds.  Pulmonary:     Effort: Pulmonary effort is normal.     Breath sounds: Normal breath sounds.   Neurological:     Mental Status: She is alert and oriented to person, place, and time.     No results found for any visits on 01/31/24.    Assessment & Plan:    Problem List Items Addressed  This Visit     Community acquired pneumonia of left upper lobe of lung - Primary   Diagnosed 01/27/2024, reports she started levaquin  500mg  yesterday.  She reports cough, chest tightness, and fatigue. O2 sat. 98%RA and HR 68  Advised to Complete levofloxacin  as prescribed Use albuterol  and hycodan as prescribed Maintain adequate oral hydration, nutrition, and rest. F/up in 1week      Relevant Medications   HYDROcodone  bit-homatropine (HYCODAN) 5-1.5 MG/5ML syrup   Return in about 10 days (around 02/10/2024) for pneumonia f/up.     Roselie Mood, NP

## 2024-01-31 NOTE — Assessment & Plan Note (Addendum)
 Diagnosed 01/27/2024, reports she started levaquin  500mg  yesterday.  She reports cough, chest tightness, and fatigue. O2 sat. 98%RA and HR 68  Advised to Complete levofloxacin  as prescribed Use albuterol  and hycodan as prescribed Maintain adequate oral hydration, nutrition, and rest. Provided work note F/up in 1week

## 2024-02-10 ENCOUNTER — Encounter: Payer: Self-pay | Admitting: Nurse Practitioner

## 2024-02-10 ENCOUNTER — Ambulatory Visit: Admitting: Nurse Practitioner

## 2024-02-10 VITALS — BP 112/66 | HR 78 | Temp 98.0°F | Ht 64.0 in | Wt 131.8 lb

## 2024-02-10 DIAGNOSIS — E1165 Type 2 diabetes mellitus with hyperglycemia: Secondary | ICD-10-CM

## 2024-02-10 DIAGNOSIS — E1169 Type 2 diabetes mellitus with other specified complication: Secondary | ICD-10-CM

## 2024-02-10 DIAGNOSIS — J189 Pneumonia, unspecified organism: Secondary | ICD-10-CM | POA: Diagnosis not present

## 2024-02-10 DIAGNOSIS — Z7984 Long term (current) use of oral hypoglycemic drugs: Secondary | ICD-10-CM

## 2024-02-10 LAB — MICROALBUMIN / CREATININE URINE RATIO
Creatinine,U: 78.5 mg/dL
Microalb Creat Ratio: 9 mg/g (ref 0.0–30.0)
Microalb, Ur: 0.7 mg/dL (ref 0.0–1.9)

## 2024-02-10 MED ORDER — METFORMIN HCL 500 MG PO TABS: 500.0000 mg | ORAL_TABLET | Freq: Every day | ORAL | 1 refills | Status: AC

## 2024-02-10 NOTE — Patient Instructions (Addendum)
 Go for repeat CXR on or after 02/26/2024.  [address]  Decrease metformin  to 500mg  1tab daily with heaviest meal.  Go yo lab for urine collection

## 2024-02-10 NOTE — Progress Notes (Signed)
 Established Patient Visit  Patient: Breanna Martinez   DOB: [birth year]   63 y.o. Female  MRN: [id number] Visit Date: 02/10/2024  Subjective:    Chief Complaint  Patient presents with   Follow-up    Follow up for pneumonia    HPI DM (diabetes mellitus) (HCC) Home glucose: 70s-80s (fasting) Postprandial glucose: 90s-100s. Current use of metformin  500mg  BID  Decrease metformin  dose to 500mg  with heaviest meal F/up in 3months  Community acquired pneumonia of left upper lobe of lung Resolved cough, SOB, chest tightness, and fatigue. Has completed oral abx.  Will repeat CXR in 2weeks  Wt Readings from Last 3 Encounters:  02/10/24 131 lb 12.8 oz (59.8 kg)  01/31/24 130 lb 6.4 oz (59.1 kg)  01/27/24 132 lb 3.2 oz (60 kg)    BP Readings from Last 3 Encounters:  02/10/24 112/66  01/31/24 110/72  01/27/24 96/60    Reviewed medical, surgical, and social history today  Medications: Outpatient Medications Prior to Visit  Medication Sig   ACCU-CHEK GUIDE test strip CHECK BLOOD GLUCOSE ONCE DAILY   Azelastine -Fluticasone  137-50 MCG/ACT SUSP PLACE 1 SPRAY INTO THE NOSE EVERY 12 (TWELVE) HOURS.   Calcium Citrate (CITRACAL PO) Take by mouth.   denosumab  (PROLIA ) 60 MG/ML SOSY injection Inject 60 mg into the skin every 6 (six) months.   esomeprazole  (NEXIUM ) 40 MG capsule TAKE 1 CAPSULE BY MOUTH EVERY DAY BEFORE BREAKFAST   meloxicam  (MOBIC ) 7.5 MG tablet TAKE 1 TABLET BY MOUTH EVERY DAY   Multiple Vitamins-Minerals (MULTIVITAMIN ADULT EXTRA C PO) multivitamin  Take 1 tablet as directed by oral route   pravastatin  (PRAVACHOL ) 40 MG tablet Take 1 tablet (40 mg total) by mouth at bedtime.   Turmeric (QC TUMERIC COMPLEX PO) Take by mouth.   [DISCONTINUED] albuterol  (VENTOLIN  HFA) 108 (90 Base) MCG/ACT inhaler Inhale 1-2 puffs into the lungs every 6 (six) hours as needed for wheezing or shortness of breath.   [DISCONTINUED] benzonatate  (TESSALON ) 100 MG capsule Take 1  capsule (100 mg total) by mouth 2 (two) times daily as needed for cough.   [DISCONTINUED] HYDROcodone  bit-homatropine (HYCODAN) 5-1.5 MG/5ML syrup Take 5 mLs by mouth every 8 (eight) hours as needed for cough.   [DISCONTINUED] metFORMIN  (GLUCOPHAGE ) 500 MG tablet Take 1 tablet (500 mg total) by mouth 2 (two) times daily with a meal.   No facility-administered medications prior to visit.   Reviewed past medical and social history.   ROS per HPI above      Objective:  BP 112/66 (BP Location: Left Arm, Patient Position: Sitting, Cuff Size: Normal)   Pulse 78   Temp 98 F (36.7 C) (Oral)   Ht 5' 4 (1.626 m)   Wt 131 lb 12.8 oz (59.8 kg)   SpO2 98%   BMI 22.62 kg/m      Physical Exam Vitals and nursing note reviewed.  Cardiovascular:     Rate and Rhythm: Normal rate and regular rhythm.     Pulses: Normal pulses.     Heart sounds: Normal heart sounds.  Pulmonary:     Effort: Pulmonary effort is normal.     Breath sounds: Normal breath sounds.  Neurological:     Mental Status: She is alert and oriented to person, place, and time.     No results found for any visits on 02/10/24.    Assessment & Plan:    Problem List Items Addressed This  Visit     Community acquired pneumonia of left upper lobe of lung - Primary   Resolved cough, SOB, chest tightness, and fatigue. Has completed oral abx.  Will repeat CXR in 2weeks      Relevant Orders   DG Chest 2 View   DM (diabetes mellitus) (HCC)   Home glucose: 70s-80s (fasting) Postprandial glucose: 90s-100s. Current use of metformin  500mg  BID  Decrease metformin  dose to 500mg  with heaviest meal F/up in 3months      Relevant Medications   metFORMIN  (GLUCOPHAGE ) 500 MG tablet   Other Relevant Orders   Urine microalbumin-creatinine with uACR   Return in about 3 months (around 05/12/2024) for HTN, DM, hyperlipidemia (fasting)-needs Prevnar 20 vaccine.     Roselie Mood, NP

## 2024-02-10 NOTE — Assessment & Plan Note (Signed)
 Home glucose: 70s-80s (fasting) Postprandial glucose: 90s-100s. Current use of metformin  500mg  BID  Decrease metformin  dose to 500mg  with heaviest meal F/up in 3months

## 2024-02-10 NOTE — Assessment & Plan Note (Signed)
 Resolved cough, SOB, chest tightness, and fatigue. Has completed oral abx.  Will repeat CXR in 2weeks

## 2024-02-11 ENCOUNTER — Ambulatory Visit: Admitting: Orthopaedic Surgery

## 2024-02-11 ENCOUNTER — Ambulatory Visit: Payer: Self-pay | Admitting: Nurse Practitioner

## 2024-02-15 ENCOUNTER — Emergency Department (HOSPITAL_COMMUNITY)
Admission: EM | Admit: 2024-02-15 | Discharge: 2024-02-16 | Disposition: A | Discharge status: Home / Self Care | Attending: Emergency Medicine | Admitting: Emergency Medicine

## 2024-02-15 ENCOUNTER — Encounter (HOSPITAL_COMMUNITY): Payer: Self-pay | Admitting: Emergency Medicine

## 2024-02-15 ENCOUNTER — Other Ambulatory Visit: Payer: Self-pay

## 2024-02-15 ENCOUNTER — Emergency Department (HOSPITAL_COMMUNITY)

## 2024-02-15 DIAGNOSIS — Z7984 Long term (current) use of oral hypoglycemic drugs: Secondary | ICD-10-CM | POA: Insufficient documentation

## 2024-02-15 DIAGNOSIS — E162 Hypoglycemia, unspecified: Secondary | ICD-10-CM | POA: Insufficient documentation

## 2024-02-15 DIAGNOSIS — Z1389 Encounter for screening for other disorder: Secondary | ICD-10-CM | POA: Diagnosis not present

## 2024-02-15 LAB — CBC WITH DIFFERENTIAL/PLATELET
Abs Immature Granulocytes: 0.02 K/uL (ref 0.00–0.07)
Basophils Absolute: 0.1 K/uL (ref 0.0–0.1)
Basophils Relative: 1 %
Eosinophils Absolute: 0.3 K/uL (ref 0.0–0.5)
Eosinophils Relative: 4 %
HCT: 37.3 % (ref 36.0–46.0)
Hemoglobin: 11.6 g/dL — ABNORMAL LOW (ref 12.0–15.0)
Immature Granulocytes: 0 %
Lymphocytes Relative: 31 %
Lymphs Abs: 2.6 K/uL (ref 0.7–4.0)
MCH: 26.9 pg (ref 26.0–34.0)
MCHC: 31.1 g/dL (ref 30.0–36.0)
MCV: 86.3 fL (ref 80.0–100.0)
Monocytes Absolute: 0.4 K/uL (ref 0.1–1.0)
Monocytes Relative: 5 %
Neutro Abs: 5 K/uL (ref 1.7–7.7)
Neutrophils Relative %: 59 %
Platelets: 284 K/uL (ref 150–400)
RBC: 4.32 MIL/uL (ref 3.87–5.11)
RDW: 13.4 % (ref 11.5–15.5)
WBC: 8.4 K/uL (ref 4.0–10.5)
nRBC: 0 % (ref 0.0–0.2)

## 2024-02-15 LAB — COMPREHENSIVE METABOLIC PANEL WITH GFR
ALT: 19 U/L (ref 0–44)
AST: 23 U/L (ref 15–41)
Albumin: 4.3 g/dL (ref 3.5–5.0)
Alkaline Phosphatase: 54 U/L (ref 38–126)
Anion gap: 12 (ref 5–15)
BUN: 22 mg/dL (ref 8–23)
CO2: 25 mmol/L (ref 22–32)
Calcium: 9.6 mg/dL (ref 8.9–10.3)
Chloride: 102 mmol/L (ref 98–111)
Creatinine, Ser: 0.68 mg/dL (ref 0.44–1.00)
GFR, Estimated: >60 mL/min (ref >60)
Glucose, Bld: 180 mg/dL — ABNORMAL HIGH (ref 70–99)
Potassium: 3.8 mmol/L (ref 3.5–5.1)
Sodium: 139 mmol/L (ref 135–145)
Total Bilirubin: 0.5 mg/dL (ref 0.0–1.2)
Total Protein: 7.6 g/dL (ref 6.5–8.1)

## 2024-02-15 LAB — CBG MONITORING, ED
Glucose-Capillary: 171 mg/dL — ABNORMAL HIGH (ref 70–99)
Glucose-Capillary: 203 mg/dL — ABNORMAL HIGH (ref 70–99)

## 2024-02-15 LAB — LIPASE, BLOOD: Lipase: 29 U/L (ref 11–51)

## 2024-02-15 NOTE — ED Provider Notes (Signed)
 [facility] Emergency Department Provider Note MRN:  [id number]  Arrival date & time: 02/28/24     Chief Complaint   Hypoglycemia   History of Present Illness   Breanna Martinez is a 63 y.o. year-old female presents to the ED with chief complaint of hypoglycemia for the past 2 weeks.  She states that she has been waking up in the mornings with her blood sugar in the 40s.  She states that she recently had pneumonia and had been on antibiotic.  She states that states that she is only taking Metformin  and reports that she has been taking only a single dose for the past week or so. States that she feels nausea, dizzy, and fatigued when her blood sugar gets low.  She states that she feels ok now.    History provided by patient.   Review of Systems  Pertinent positive and negative review of systems noted in HPI.    Physical Exam   Vitals:   02/15/24 2153 02/15/24 2230  BP:  126/83  Pulse:  65  Resp:  18  Temp: 98 F (36.7 C) 98 F (36.7 C)  SpO2:  98%    CONSTITUTIONAL:  non toxic-appearing, NAD NEURO:  Alert and oriented x 3, CN 3-12 grossly intact EYES:  eyes equal and reactive ENT/NECK:  Supple, no stridor  CARDIO:  normal rate, regular rhythm, appears well-perfused  PULM:  No respiratory distress, CTAB GI/GU:  non-distended,  MSK/SPINE:  No gross deformities, no edema, moves all extremities  SKIN:  no rash, atraumatic   *Additional and/or pertinent findings included in MDM below  Diagnostic and Interventional Summary    EKG Interpretation Date/Time:    Ventricular Rate:    PR Interval:    QRS Duration:    QT Interval:    QTC Calculation:   R Axis:      Text Interpretation:         Labs Reviewed  CBC WITH DIFFERENTIAL/PLATELET - Abnormal; Notable for the following components:      Result Value   Hemoglobin 11.6 (*)    All other components within normal limits  COMPREHENSIVE METABOLIC PANEL WITH GFR - Abnormal; Notable for the  following components:   Glucose, Bld 180 (*)    All other components within normal limits  CBG MONITORING, ED - Abnormal; Notable for the following components:   Glucose-Capillary 171 (*)    All other components within normal limits  CBG MONITORING, ED - Abnormal; Notable for the following components:   Glucose-Capillary 203 (*)    All other components within normal limits  TSH  LIPASE, BLOOD    DG Chest 2 View  Final Result      Medications - No data to display   Procedures  /  Critical Care Procedures  ED Course and Medical Decision Making  I have reviewed the triage vital signs, the nursing notes, and pertinent available records from the EMR.  Social Determinants Affecting Complexity of Care: Patient has no clinically significant social determinants affecting this chief complaint..   ED Course:    Medical Decision Making Patient seen today for hypoglycemia.  She reports having an episode this morning and was symptomatic.  She only takes metformin .  She is not on any insulins.  She states that she doesn't always eat after getting home from work.  Labs today have been fairly reassuring.  No episodes of hypoglycemia in the ED.  She feels well now.  Will plan for discharge  with PCP follow up and instructions to eat at least 3 balanced meals per day as fasting could be the reason for her symptoms.  Amount and/or Complexity of Data Reviewed Labs: ordered. Radiology: ordered.         Consultants: No consultations were needed in caring for this patient.   Treatment and Plan: I considered admission due to patient's initial presentation, but after considering the examination and diagnostic results, patient will not require admission and can be discharged with outpatient follow-up.    Final Clinical Impressions(s) / ED Diagnoses     ICD-10-CM   1. Hypoglycemia  E16.2       ED Discharge Orders     None         Discharge Instructions Discussed with and  Provided to Patient:     Discharge Instructions      Be certain to eat 3 balanced meals per day, you should have a few snacks throughout the day as well.  I recommend having some orange juice in the morning if your blood sugars low.  Please follow-up with your primary care doctor.       Vicky Charleston, PA-C 02/28/24 0757    Trine Raynell Moder, MD 02/28/24 [phone]

## 2024-02-15 NOTE — ED Triage Notes (Signed)
 Pt reports blood sugars today ranging from the 40s, then would come up to 200 after eating, then go back down and most recently in the 70s.  Has had n/v this afternoon as well and some vision changes.  Recent anbx for pna per patient finished last week.

## 2024-02-16 LAB — TSH: TSH: 2.38 u[IU]/mL (ref 0.350–4.500)

## 2024-02-16 NOTE — Discharge Instructions (Signed)
 Be certain to eat 3 balanced meals per day, you should have a few snacks throughout the day as well.  I recommend having some orange juice in the morning if your blood sugars low.  Please follow-up with your primary care doctor.

## 2024-02-17 ENCOUNTER — Encounter: Payer: Self-pay | Admitting: Nurse Practitioner

## 2024-02-17 DIAGNOSIS — E162 Hypoglycemia, unspecified: Secondary | ICD-10-CM

## 2024-02-17 DIAGNOSIS — E1169 Type 2 diabetes mellitus with other specified complication: Secondary | ICD-10-CM

## 2024-02-18 MED ORDER — FREESTYLE LIBRE 3 PLUS SENSOR MISC: 11 refills | Status: DC

## 2024-02-18 NOTE — Addendum Note (Signed)
 Addended by: KATHEEN GARDEN L on: 02/18/2024 10:49 AM   Modules accepted: Orders

## 2024-02-18 NOTE — Addendum Note (Signed)
 Addended by: KATHEEN GARDEN L on: 02/18/2024 11:15 AM   Modules accepted: Orders

## 2024-02-22 DIAGNOSIS — Z7984 Long term (current) use of oral hypoglycemic drugs: Secondary | ICD-10-CM | POA: Diagnosis not present

## 2024-02-22 DIAGNOSIS — W010XXA Fall on same level from slipping, tripping and stumbling without subsequent striking against object, initial encounter: Secondary | ICD-10-CM | POA: Diagnosis not present

## 2024-02-22 DIAGNOSIS — E119 Type 2 diabetes mellitus without complications: Secondary | ICD-10-CM | POA: Diagnosis not present

## 2024-02-22 DIAGNOSIS — S79911A Unspecified injury of right hip, initial encounter: Secondary | ICD-10-CM | POA: Diagnosis not present

## 2024-02-22 DIAGNOSIS — M25551 Pain in right hip: Secondary | ICD-10-CM | POA: Diagnosis not present

## 2024-02-22 DIAGNOSIS — S299XXA Unspecified injury of thorax, initial encounter: Secondary | ICD-10-CM | POA: Diagnosis not present

## 2024-02-22 DIAGNOSIS — M546 Pain in thoracic spine: Secondary | ICD-10-CM | POA: Diagnosis not present

## 2024-02-22 DIAGNOSIS — S3992XA Unspecified injury of lower back, initial encounter: Secondary | ICD-10-CM | POA: Diagnosis not present

## 2024-02-22 DIAGNOSIS — M545 Low back pain, unspecified: Secondary | ICD-10-CM | POA: Diagnosis not present

## 2024-02-23 ENCOUNTER — Other Ambulatory Visit: Payer: Self-pay | Admitting: Nurse Practitioner

## 2024-02-23 DIAGNOSIS — J324 Chronic pansinusitis: Secondary | ICD-10-CM

## 2024-02-24 ENCOUNTER — Ambulatory Visit: Payer: Self-pay

## 2024-02-24 NOTE — Telephone Encounter (Signed)
 FYI Only or Action Required?: Action required by provider: request for appointment.  Patient was last seen in primary care on 02/10/2024 by [name], Breanna Rockford, NP.  Called Nurse Triage reporting Fall.  Symptoms began several days ago.  Interventions attempted: Prescription medications:  SABRA  Symptoms are: unchanged. Pt. Breanna Martinez and was seen in ED.Continues to have back and hip pain.  Triage Disposition: See PCP When Office is Open (Within 3 Days)  Patient/caregiver understands and will follow disposition?: YesCopied from CRM [id number]. Topic: Clinical - Red Word Triage >> Feb 24, 2024 10:18 AM Franky GRADE wrote: Red Word that prompted transfer to Nurse Triage: Patient suffered a fall during the weekend and was seen in the ER, she was informed that nothing was broken; however, patient is experiencing severe hip and back pain. Reason for Disposition  MILD weakness (e.g., does not interfere with ability to work, go to school, normal activities)  (Exception: Mild weakness is a chronic symptom.)  Answer Assessment - Initial Assessment Questions 1. MECHANISM: How did the fall happen?     Tripped on stairs 2. DOMESTIC VIOLENCE AND ELDER ABUSE SCREENING: Did you fall because someone pushed you or tried to hurt you? If Yes, ask: Are you safe now?     no 3. ONSET: When did the fall happen? (e.g., minutes, hours, or days ago)     weekend 4. LOCATION: What part of the body hit the ground? (e.g., back, buttocks, head, hips, knees, hands, head, stomach)     Back 5. INJURY: Did you hurt (injure) yourself when you fell? If Yes, ask: What did you injure? Tell me more about this? (e.g., body area; type of injury; pain severity)     Back and right hip 6. PAIN: Is there any pain? If Yes, ask: How bad is the pain? (e.g., Scale 0-10; or none, mild,      severe 7. SIZE: For cuts, bruises, or swelling, ask: How large is it? (e.g., inches or centimeters)      N/a 8. PREGNANCY: Is there any chance  you are pregnant? When was your last menstrual period?     no 9. OTHER SYMPTOMS: Do you have any other symptoms? (e.g., dizziness, fever, weakness; new-onset or worsening).      no 10. CAUSE: What do you think caused the fall (or falling)? (e.g., dizzy spell, tripped)       tripped  Protocols used: Falls and [facility]

## 2024-02-24 NOTE — Telephone Encounter (Signed)
 Changed to OFFICE VISIT as pt was evaluated in ER.

## 2024-02-25 ENCOUNTER — Inpatient Hospital Stay: Admitting: Internal Medicine

## 2024-02-26 ENCOUNTER — Ambulatory Visit
Admission: RE | Admit: 2024-02-26 | Discharge: 2024-02-26 | Disposition: A | Discharge status: Home / Self Care | Source: Ambulatory Visit | Attending: Nurse Practitioner

## 2024-02-26 ENCOUNTER — Encounter: Payer: Self-pay | Admitting: Nurse Practitioner

## 2024-02-26 ENCOUNTER — Other Ambulatory Visit: Payer: Self-pay | Admitting: Nurse Practitioner

## 2024-02-26 ENCOUNTER — Ambulatory Visit: Admitting: Nurse Practitioner

## 2024-02-26 VITALS — BP 112/73 | HR 86 | Temp 98.0°F | Ht 64.0 in | Wt 134.4 lb

## 2024-02-26 DIAGNOSIS — S20211D Contusion of right front wall of thorax, subsequent encounter: Secondary | ICD-10-CM

## 2024-02-26 DIAGNOSIS — J189 Pneumonia, unspecified organism: Secondary | ICD-10-CM

## 2024-02-26 DIAGNOSIS — W19XXXD Unspecified fall, subsequent encounter: Secondary | ICD-10-CM | POA: Diagnosis not present

## 2024-02-26 DIAGNOSIS — R0789 Other chest pain: Secondary | ICD-10-CM | POA: Diagnosis not present

## 2024-02-26 NOTE — Progress Notes (Signed)
 Acute Office Visit  Subjective:    Patient ID: Breanna Martinez, female    DOB: [birth year], 63 y.o.   MRN: [id number]  Chief Complaint  Patient presents with   Follow-up    Recent ED visit for a fall    HPI Patient is in today for ED f/up. She slipped on stairs at sister's house. Impact on right flank region. No LOC. CXR completed in ED 02/15/2024-no fracture. Today she reports persistent right chest wall discomfort with deep breathe and right arm movement or in supine position. Current use of tylenol  and ibuprofen  prn with significant relief. Denies any fever or SOB or cough  Wt Readings from Last 3 Encounters:  02/26/24 134 lb 6.4 oz (61 kg)  02/10/24 131 lb 12.8 oz (59.8 kg)  01/31/24 130 lb 6.4 oz (59.1 kg)   Outpatient Medications Prior to Visit  Medication Sig   ACCU-CHEK GUIDE test strip CHECK BLOOD GLUCOSE ONCE DAILY   acetaminophen  (TYLENOL ) 500 MG tablet Take 1,000 mg by mouth every 6 (six) hours as needed for mild pain (pain score 1-3).   Azelastine -Fluticasone  137-50 MCG/ACT SUSP PLACE 1 SPRAY INTO THE NOSE EVERY 12 (TWELVE) HOURS.   Calcium Citrate (CITRACAL PO) Take by mouth.   Continuous Glucose Sensor (FREESTYLE LIBRE 3 PLUS SENSOR) MISC Change sensor every 15 days.   denosumab  (PROLIA ) 60 MG/ML SOSY injection Inject 60 mg into the skin every 6 (six) months.   esomeprazole  (NEXIUM ) 40 MG capsule TAKE 1 CAPSULE BY MOUTH EVERY DAY BEFORE BREAKFAST   ibuprofen  (ADVIL ) 600 MG tablet Take 600 mg by mouth every 6 (six) hours as needed for moderate pain (pain score 4-6).   meloxicam  (MOBIC ) 7.5 MG tablet TAKE 1 TABLET BY MOUTH EVERY DAY   metFORMIN  (GLUCOPHAGE ) 500 MG tablet Take 1 tablet (500 mg total) by mouth daily with breakfast.   Multiple Vitamins-Minerals (MULTIVITAMIN ADULT EXTRA C PO) multivitamin  Take 1 tablet as directed by oral route   pravastatin  (PRAVACHOL ) 40 MG tablet Take 1 tablet (40 mg total) by mouth at bedtime.   Turmeric (QC TUMERIC COMPLEX PO)  Take by mouth.   No facility-administered medications prior to visit.    Reviewed past medical and social history.  Review of Systems Per HPI     Objective:    Physical Exam Vitals and nursing note reviewed.  Pulmonary:     Effort: Pulmonary effort is normal.     Comments: Diminished lungs sounds in RLL. Shallow breathing due to pain Chest:     Chest wall: Tenderness present. No mass, lacerations, deformity or crepitus.  Skin:    Findings: No bruising or erythema.  Neurological:     Mental Status: She is alert and oriented to person, place, and time.     BP 112/73 (BP Location: Left Arm, Patient Position: Sitting)   Pulse 86   Temp 98 F (36.7 C) (Oral)   Ht 5' 4 (1.626 m)   Wt 134 lb 6.4 oz (61 kg)   SpO2 100%   BMI 23.07 kg/m    No results found for any visits on 02/26/24.     Assessment & Plan:   Problem List Items Addressed This Visit   None Visit Diagnoses       Fall, subsequent encounter    -  Primary   Relevant Orders   DG Ribs Unilateral W/Chest Left     Contusion of right chest wall, subsequent encounter       Relevant Orders  DG Ribs Unilateral W/Chest Left     Go to [address] for repeat CXR. Continue use of cold compress, tylenol  and ibuprofen  Perform deep breathing exercise 2-3x/day Provided worknote  No orders of the defined types were placed in this encounter.  Return if symptoms worsen or fail to improve.    Roselie Mood, NP

## 2024-02-26 NOTE — Patient Instructions (Signed)
 Continue use of cold compress, tylenol  and ibuprofen  Perform deep breathing exercise 2-3x/day  Chest Wall Pain Chest wall pain is pain in or around the bones and muscles of your chest. Chest wall pain may be caused by: An injury. Coughing a lot. Using your chest and arm muscles too much. Sometimes, the cause may not be known. This pain may take a few weeks or longer to get better. Follow these instructions at home: Managing pain, stiffness, and swelling If told, put ice on the painful area: Put ice in a plastic bag. Place a towel between your skin and the bag. Leave the ice on for 20 minutes, 2-3 times a day.  Activity Rest as told by your doctor. Avoid doing things that cause pain. This includes lifting heavy items. Ask your doctor what activities are safe for you. General instructions  Take over-the-counter and prescription medicines only as told by your doctor. Do not use any products that contain nicotine or tobacco, such as cigarettes, e-cigarettes, and chewing tobacco. If you need help quitting, ask your doctor. Keep all follow-up visits as told by your doctor. This is important. Contact a doctor if: You have a fever. Your chest pain gets worse. You have new symptoms. Get help right away if: You feel sick to your stomach (nauseous) or you throw up (vomit). You feel sweaty or light-headed. You have a cough with mucus from your lungs (sputum) or you cough up blood. You are short of breath. These symptoms may be an emergency. Do not wait to see if the symptoms will go away. Get medical help right away. Call your local emergency services (911 in the [place] Do not drive yourself to the hospital. Summary Chest wall pain is pain in or around the bones and muscles of your chest. It may be treated with ice, rest, and medicines. Your condition may also get better if you avoid doing things that cause pain. Contact a doctor if you have a fever, chest pain that gets worse, or new  symptoms. Get help right away if you feel light-headed or you get short of breath. These symptoms may be an emergency. This information is not intended to replace advice given to you by your health care provider. Make sure you discuss any questions you have with your health care provider. Document Revised: 07/16/2022 Document Reviewed: 07/16/2022 Elsevier Patient Education  2024 [employer].

## 2024-03-04 ENCOUNTER — Ambulatory Visit: Payer: Self-pay | Admitting: Nurse Practitioner

## 2024-03-17 ENCOUNTER — Telehealth: Payer: Self-pay | Admitting: *Deleted

## 2024-03-17 NOTE — Telephone Encounter (Signed)
 Patient in PAP recall for 6 month pap 04/2024.   Call placed to patient, left detailed message, ok per dpr.  Requested return call to schedule 6 month repeat pap, [phone], opt 1.

## 2024-03-23 DIAGNOSIS — H43823 Vitreomacular adhesion, bilateral: Secondary | ICD-10-CM | POA: Diagnosis not present

## 2024-03-23 DIAGNOSIS — H2513 Age-related nuclear cataract, bilateral: Secondary | ICD-10-CM | POA: Diagnosis not present

## 2024-03-23 DIAGNOSIS — H1013 Acute atopic conjunctivitis, bilateral: Secondary | ICD-10-CM | POA: Diagnosis not present

## 2024-03-23 DIAGNOSIS — E119 Type 2 diabetes mellitus without complications: Secondary | ICD-10-CM | POA: Diagnosis not present

## 2024-04-02 ENCOUNTER — Telehealth: Payer: Self-pay

## 2024-04-02 NOTE — Telephone Encounter (Signed)
 Prolia  arrived, placed in fridge.

## 2024-04-07 NOTE — Telephone Encounter (Signed)
 Called pt and scheduled appt

## 2024-04-07 NOTE — Telephone Encounter (Signed)
 Per review of EPIC, patient is scheduled for 04/23/24 with Dr. Dallie.   Routing FYI.   Encounter closed.

## 2024-04-10 ENCOUNTER — Ambulatory Visit (INDEPENDENT_AMBULATORY_CARE_PROVIDER_SITE_OTHER)

## 2024-04-10 DIAGNOSIS — E559 Vitamin D deficiency, unspecified: Secondary | ICD-10-CM

## 2024-04-10 DIAGNOSIS — M810 Age-related osteoporosis without current pathological fracture: Secondary | ICD-10-CM

## 2024-04-10 MED ORDER — DENOSUMAB 60 MG/ML ~~LOC~~ SOSY
60.0000 mg | PREFILLED_SYRINGE | Freq: Once | SUBCUTANEOUS | Status: AC
  Administered 2024-04-10: 60 mg via SUBCUTANEOUS

## 2024-04-21 MED ORDER — DENOSUMAB 60 MG/ML ~~LOC~~ SOSY: 60.0000 mg | PREFILLED_SYRINGE | Freq: Once | SUBCUTANEOUS | Status: AC

## 2024-04-21 NOTE — Progress Notes (Signed)
 After obtaining consent, and per orders of [facility], injection of Prolia  60mg  given SubQ in the right arm by LaVon DeBerry on 04/10/2024.   Note being completed by Dedra Sorenson on 04/21/2024 to correct a billing issue.

## 2024-04-21 NOTE — Addendum Note (Signed)
 Addended by: ALESSANDRA DEDRA PARAS on: 04/21/2024 08:47 AM   Modules accepted: Orders

## 2024-04-22 ENCOUNTER — Ambulatory Visit: Admitting: Obstetrics and Gynecology

## 2024-04-23 ENCOUNTER — Ambulatory Visit: Admitting: Obstetrics and Gynecology

## 2024-04-27 ENCOUNTER — Ambulatory Visit: Admitting: Obstetrics and Gynecology

## 2024-05-12 ENCOUNTER — Other Ambulatory Visit (HOSPITAL_COMMUNITY)
Admission: RE | Admit: 2024-05-12 | Discharge: 2024-05-12 | Disposition: A | Discharge status: Home / Self Care | Source: Ambulatory Visit | Attending: Obstetrics and Gynecology | Admitting: Obstetrics and Gynecology

## 2024-05-12 ENCOUNTER — Encounter: Payer: Self-pay | Admitting: Obstetrics and Gynecology

## 2024-05-12 ENCOUNTER — Ambulatory Visit: Admitting: Obstetrics and Gynecology

## 2024-05-12 VITALS — BP 92/60 | HR 76 | Temp 98.0°F | Wt 131.0 lb

## 2024-05-12 DIAGNOSIS — R87618 Other abnormal cytological findings on specimens from cervix uteri: Secondary | ICD-10-CM

## 2024-05-12 NOTE — Progress Notes (Signed)
 63 y.o. G2P1011 postmenopausal female with abnl PAP, osteoporosis on Prolia  (managed by her PCP) here for interval AP. Single. From [place] originally. 1 daughter, 5 grandchildren. Former Dr. Rutherford patient.   No LMP recorded. Patient is postmenopausal. Reports in office cervical procedure for abnormal PAP in 1990s. Normal PAPs since. Not sexually active  Last PAP:    Component Value Date/Time   DIAGPAP - Atrophic pattern with epithelial atypia (A) 09/25/2023 0952   HPVHIGH Negative 09/25/2023 0952   ADEQPAP  09/25/2023 0952    Satisfactory for evaluation; transformation zone component PRESENT.   10/14/23 colpo: unsatisfactory, ECC performed: A. ENDOCERVIX, CURETTAGE:  Fragments of atrophic squamous epithelium with mild atypia.  Benign endocervical mucosa.  Immunohistochemistry for p16 is negative.  Negative for dysplasia.   GYN HISTORY: No significant history   OB History  Gravida Para Term Preterm AB Living  2 1 1  1 1   SAB IAB Ectopic Multiple Live Births  1    1    # Outcome Date GA Lbr Len/2nd Weight Sex Type Anes PTL Lv  2 Term      Vag-Spont   LIV  1 SAB             Past Medical History:  Diagnosis Date   Allergy penicillin   seasonal allergies   Osteoporosis     Past Surgical History:  Procedure Laterality Date   BREAST BIOPSY Left 07/10/2021   fibroadenoma   rotator cuff surgery     TOE SURGERY      Current Outpatient Medications on File Prior to Visit  Medication Sig Dispense Refill   Calcium Citrate (CITRACAL PO) Take by mouth.     denosumab  (PROLIA ) 60 MG/ML SOSY injection Inject 60 mg into the skin every 6 (six) months. 1 mL 1   metFORMIN  (GLUCOPHAGE ) 500 MG tablet Take 1 tablet (500 mg total) by mouth daily with breakfast. 90 tablet 1   Multiple Vitamins-Minerals (MULTIVITAMIN ADULT EXTRA C PO) multivitamin  Take 1 tablet as directed by oral route     pravastatin  (PRAVACHOL ) 40 MG tablet Take 1 tablet (40 mg total) by mouth at bedtime.  90 tablet 3   Turmeric (QC TUMERIC COMPLEX PO) Take by mouth.     Current Facility-Administered Medications on File Prior to Visit  Medication Dose Route Frequency Provider Last Rate Last Admin   [START ON 10/19/2024] denosumab  (PROLIA ) injection 60 mg  60 mg Subcutaneous Once [name], Charlotte Lum, NP        Allergies  Allergen Reactions   Penicillins Anaphylaxis      PE Today's Vitals   05/12/24 1039  BP: 92/60  Pulse: 76  Temp: 98 F (36.7 C)  TempSrc: Oral  SpO2: 99%  Weight: 131 lb (59.4 kg)   Body mass index is 22.49 kg/m.  Physical Exam Vitals reviewed. Exam conducted with a chaperone present.  Constitutional:      General: She is not in acute distress.    Appearance: Normal appearance.  HENT:     Head: Normocephalic and atraumatic.     Nose: Nose normal.  Eyes:     Extraocular Movements: Extraocular movements intact.     Conjunctiva/sclera: Conjunctivae normal.  Pulmonary:     Effort: Pulmonary effort is normal.  Genitourinary:    General: Normal vulva.     Exam position: Lithotomy position.     Vagina: Normal. No vaginal discharge.     Cervix: Normal. No cervical motion tenderness, discharge or lesion.  Uterus: Normal. Not enlarged and not tender.      Adnexa: Right adnexa normal and left adnexa normal.  Musculoskeletal:        General: Normal range of motion.     Cervical back: Normal range of motion.  Neurological:     General: No focal deficit present.     Mental Status: She is alert.  Psychiatric:        Mood and Affect: Mood normal.        Behavior: Behavior normal.      Assessment and Plan:        Other abnormal cytological finding of specimen from cervix -     Cytology - PAP  RTO for annual exam  Vera LULLA Pa, MD

## 2024-05-14 LAB — CYTOLOGY - PAP
Comment: NEGATIVE
Diagnosis: NEGATIVE
High risk HPV: NEGATIVE

## 2024-05-15 ENCOUNTER — Ambulatory Visit (HOSPITAL_BASED_OUTPATIENT_CLINIC_OR_DEPARTMENT_OTHER): Payer: Self-pay | Admitting: Obstetrics and Gynecology

## 2024-06-08 ENCOUNTER — Encounter: Payer: Self-pay | Admitting: Radiology

## 2024-06-10 ENCOUNTER — Ambulatory Visit: Admitting: Nurse Practitioner

## 2024-06-10 ENCOUNTER — Telehealth: Payer: Self-pay

## 2024-06-10 ENCOUNTER — Encounter: Payer: Self-pay | Admitting: Nurse Practitioner

## 2024-06-10 VITALS — BP 110/74 | HR 74 | Temp 97.0°F | Ht 64.0 in | Wt 135.0 lb

## 2024-06-10 DIAGNOSIS — M62838 Other muscle spasm: Secondary | ICD-10-CM | POA: Diagnosis not present

## 2024-06-10 MED ORDER — LIDOCAINE 4 % EX PTCH: 1.0000 | MEDICATED_PATCH | CUTANEOUS | Status: AC

## 2024-06-10 MED ORDER — NAPROXEN 500 MG PO TABS: 500.0000 mg | ORAL_TABLET | Freq: Two times a day (BID) | ORAL | 0 refills | Status: DC

## 2024-06-10 MED ORDER — KETOROLAC TROMETHAMINE 30 MG/ML IJ SOLN
30.0000 mg | Freq: Once | INTRAMUSCULAR | Status: AC
  Administered 2024-06-10: 30 mg via INTRAMUSCULAR

## 2024-06-10 MED ORDER — CYCLOBENZAPRINE HCL 5 MG PO TABS: 5.0000 mg | ORAL_TABLET | Freq: Every day | ORAL | 0 refills | Status: AC

## 2024-06-10 NOTE — Progress Notes (Signed)
 Acute Office Visit  Subjective:    Patient ID: Breanna Martinez, female    DOB: [birth year], 63 y.o.   MRN: [id number]  Chief Complaint  Patient presents with   Pain    Intermittent pain between shoulders and lower back and left shoulder  Requested Eye exam     Shoulder Pain  The pain is present in the left shoulder and neck (upper back  pain and left shoulder). This is a new problem. The current episode started in the past 7 days (onset on Sunday midday). There has been no history of extremity trauma. The problem occurs constantly. The problem has been unchanged. The quality of the pain is described as aching. The pain is severe. Associated symptoms include a limited range of motion and stiffness. Pertinent negatives include no fever, inability to bear weight, itching, joint locking, joint swelling, numbness or tingling. The symptoms are aggravated by activity. She has tried nothing for the symptoms. Family history does not include gout or rheumatoid arthritis. Her past medical history is significant for diabetes and osteoarthritis. There is no history of gout or rheumatoid arthritis.   Outpatient Medications Prior to Visit  Medication Sig   Calcium Citrate (CITRACAL PO) Take by mouth.   denosumab  (PROLIA ) 60 MG/ML SOSY injection Inject 60 mg into the skin every 6 (six) months.   metFORMIN  (GLUCOPHAGE ) 500 MG tablet Take 1 tablet (500 mg total) by mouth daily with breakfast.   Multiple Vitamins-Minerals (MULTIVITAMIN ADULT EXTRA C PO) multivitamin  Take 1 tablet as directed by oral route   pravastatin  (PRAVACHOL ) 40 MG tablet Take 1 tablet (40 mg total) by mouth at bedtime.   Turmeric (QC TUMERIC COMPLEX PO) Take by mouth.   Facility-Administered Medications Prior to Visit  Medication Dose Route Frequency Provider   [START ON 10/19/2024] denosumab  (PROLIA ) injection 60 mg  60 mg Subcutaneous Once Doyle Kunath Lum, NP   Reviewed past medical and social history.  Review of Systems   Constitutional:  Negative for fever.  Musculoskeletal:  Positive for stiffness. Negative for gout.  Skin:  Negative for itching.  Neurological:  Negative for tingling and numbness.   Per HPI     Objective:    Physical Exam Vitals and nursing note reviewed.  Cardiovascular:     Rate and Rhythm: Normal rate.     Pulses: Normal pulses.  Pulmonary:     Effort: Pulmonary effort is normal.  Musculoskeletal:     Right shoulder: Normal.     Left shoulder: Normal.     Right upper arm: Normal.     Left upper arm: Normal.     Cervical back: Spasms and tenderness present. No rigidity, torticollis, bony tenderness or crepitus. Pain with movement and muscular tenderness present. No spinous process tenderness. Decreased range of motion.     Thoracic back: Tenderness present. No spasms or bony tenderness. Normal range of motion.  Lymphadenopathy:     Cervical: No cervical adenopathy.  Neurological:     Mental Status: She is alert and oriented to person, place, and time.    BP 110/74 (BP Location: Left Arm, Patient Position: Sitting, Cuff Size: Normal)   Pulse 74   Temp (!) 97 F (36.1 C) (Temporal)   Ht 5' 4 (1.626 m)   Wt 135 lb (61.2 kg)   SpO2 97%   BMI 23.17 kg/m    No results found for any visits on 06/10/24.     Assessment & Plan:   Problem List Items Addressed  This Visit   None Visit Diagnoses       Trapezius muscle spasm    -  Primary   Relevant Medications   ketorolac  (TORADOL ) 30 MG/ML injection 30 mg (Completed)   naproxen (NAPROSYN) 500 MG tablet   lidocaine  (SALONPAS PAIN RELIEVING) 4 %   cyclobenzaprine  (FLEXERIL ) 5 MG tablet      Meds ordered this encounter  Medications   ketorolac  (TORADOL ) 30 MG/ML injection 30 mg   naproxen (NAPROSYN) 500 MG tablet    Sig: Take 1 tablet (500 mg total) by mouth 2 (two) times daily with a meal.    Dispense:  20 tablet    Refill:  0    Supervising Provider:   BERNETA FALLOW ALFRED [[id number]]   lidocaine  (SALONPAS PAIN  RELIEVING) 4 %    Sig: Place 1 patch onto the skin daily.    Supervising Provider:   BERNETA FALLOW ALFRED [[id number]]   cyclobenzaprine  (FLEXERIL ) 5 MG tablet    Sig: Take 1 tablet (5 mg total) by mouth at bedtime.    Dispense:  14 tablet    Refill:  0    Supervising Provider:   BERNETA FALLOW ALFRED [[id number]]   Start naproxen in PM today since toradol  was administered this AM. Alternate between warm and cold compress as needed. Advised about need for proper posture. Proper work note for light duty x 3days.  Return if symptoms worsen or fail to improve.    Breanna Mood, NP

## 2024-06-10 NOTE — Patient Instructions (Signed)
 Start naproxen in PM today. Alternate between warm and cold compress as needed.

## 2024-06-10 NOTE — Telephone Encounter (Signed)
CLINICAL USE BELOW THIS LINE (use X to signify taken)  __X__Form received and placed in providers office for signature. ____Form completed and faxed to LOA Dept. ____Form completed & LVM to notify pt ready for pick up ____Charge sheet & copy of form in front office folder for office supervisor.   

## 2024-06-15 DIAGNOSIS — Z0279 Encounter for issue of other medical certificate: Secondary | ICD-10-CM

## 2024-06-15 NOTE — Telephone Encounter (Signed)
 Called patient and asked about the FMLA paper she left at her last appointment. Patient stated that it's the renewal for her hip and shoulder. I thanked her for taking my call.

## 2024-06-15 NOTE — Telephone Encounter (Signed)
 Patient called and made aware FMLA forms have been completed and faxed to the requesting office. Informed patient that she can pick up the originals if needed. She thanked me for calling

## 2024-06-15 NOTE — Telephone Encounter (Signed)
 CLINICAL USE BELOW THIS LINE (use X to signify taken)  ____Form received and placed in providers office for signature. _X___Form completed and faxed to LOA Dept. _X___Form completed & called to notify pt ready for pick up _X___Charge sheet & copy of form in front office folder for office supervisor.

## 2024-06-18 ENCOUNTER — Other Ambulatory Visit: Payer: Self-pay | Admitting: Nurse Practitioner

## 2024-06-18 DIAGNOSIS — Z1231 Encounter for screening mammogram for malignant neoplasm of breast: Secondary | ICD-10-CM

## 2024-06-22 ENCOUNTER — Ambulatory Visit
Admission: RE | Admit: 2024-06-22 | Discharge: 2024-06-22 | Disposition: A | Payer: Federal, State, Local not specified - PPO | Source: Ambulatory Visit | Attending: Nurse Practitioner

## 2024-06-22 DIAGNOSIS — Z1231 Encounter for screening mammogram for malignant neoplasm of breast: Secondary | ICD-10-CM

## 2024-06-25 ENCOUNTER — Other Ambulatory Visit: Payer: Self-pay | Admitting: Nurse Practitioner

## 2024-06-25 DIAGNOSIS — R928 Other abnormal and inconclusive findings on diagnostic imaging of breast: Secondary | ICD-10-CM

## 2024-06-26 ENCOUNTER — Ambulatory Visit: Payer: Self-pay | Admitting: Nurse Practitioner

## 2024-06-30 ENCOUNTER — Encounter: Payer: Self-pay | Admitting: Nurse Practitioner

## 2024-07-10 ENCOUNTER — Other Ambulatory Visit: Payer: Self-pay | Admitting: Nurse Practitioner

## 2024-07-10 ENCOUNTER — Ambulatory Visit
Admission: RE | Admit: 2024-07-10 | Discharge: 2024-07-10 | Disposition: A | Discharge status: Home / Self Care | Source: Ambulatory Visit | Attending: Nurse Practitioner | Admitting: Nurse Practitioner

## 2024-07-10 ENCOUNTER — Ambulatory Visit
Admission: RE | Admit: 2024-07-10 | Discharge: 2024-07-10 | Disposition: A | Source: Ambulatory Visit | Attending: Nurse Practitioner

## 2024-07-10 DIAGNOSIS — R928 Other abnormal and inconclusive findings on diagnostic imaging of breast: Secondary | ICD-10-CM

## 2024-07-10 DIAGNOSIS — N6312 Unspecified lump in the right breast, upper inner quadrant: Secondary | ICD-10-CM

## 2024-07-10 DIAGNOSIS — N6489 Other specified disorders of breast: Secondary | ICD-10-CM | POA: Diagnosis not present

## 2024-07-10 DIAGNOSIS — D241 Benign neoplasm of right breast: Secondary | ICD-10-CM | POA: Diagnosis not present

## 2024-07-10 HISTORY — PX: BREAST BIOPSY: SHX20

## 2024-07-13 ENCOUNTER — Ambulatory Visit: Payer: Self-pay | Admitting: Nurse Practitioner

## 2024-07-13 LAB — SURGICAL PATHOLOGY

## 2024-07-15 ENCOUNTER — Encounter: Payer: Self-pay | Admitting: Nurse Practitioner

## 2024-07-20 ENCOUNTER — Ambulatory Visit: Admitting: Emergency Medicine

## 2024-07-20 ENCOUNTER — Encounter: Payer: Self-pay | Admitting: Emergency Medicine

## 2024-07-20 VITALS — BP 112/80 | HR 73 | Resp 16 | Ht 64.0 in | Wt 135.0 lb

## 2024-07-20 DIAGNOSIS — M1612 Unilateral primary osteoarthritis, left hip: Secondary | ICD-10-CM | POA: Diagnosis not present

## 2024-07-20 MED ORDER — KETOROLAC TROMETHAMINE 60 MG/2ML IM SOLN
30.0000 mg | Freq: Once | INTRAMUSCULAR | Status: AC
  Administered 2024-07-20: 12:00:00 30 mg via INTRAMUSCULAR

## 2024-07-20 MED ORDER — MELOXICAM 7.5 MG PO TABS: 7.5000 mg | ORAL_TABLET | Freq: Every day | ORAL | 0 refills | Status: AC

## 2024-07-20 NOTE — Patient Instructions (Signed)
 Please head to [address], Basement level, to complete your x-ray. You do not need to schedule an appointment for this

## 2024-07-20 NOTE — Progress Notes (Signed)
 Assessment & Plan:   Assessment & Plan Primary osteoarthritis of left hip Primary osteoarthritis of left hip with associated neuralgia Chronic left hip pain with moderate arthritis, worsening over weeks. Pain radiates down the leg, likely due to altered gait from arthritis. Tenderness in groin and under buttock consistent with hip joint involvement. Differential includes nerve pain secondary to altered gait. - Prescribed meloxicam  7.5 mg to 15 mg once daily with the largest meal. - Administered Toradol  injection for immediate pain relief. - Referred to orthopedics for further evaluation and management. Xray to be done if orthopedics unable to see her this week, to r/o occult fx - Advised to balance activity with rest to avoid overexertion.  Osteopenia Noted on previous x-ray. Steroid use avoided due to potential impact on bone density. - Continue calcium and vitamin D  supplementation. Orders:   meloxicam  (MOBIC ) 7.5 MG tablet; Take 1-2 tablets (7.5-15 mg total) by mouth daily.   Ambulatory referral to Orthopedic Surgery   ketorolac  (TORADOL ) injection 30 mg   XR HIP UNILAT W OR W/O PELVIS 2-3 VIEWS LEFT     Corean Geralds, MSPAS, PA-C    Subjective:   Chief Complaint  Patient presents with   Hip Pain    Left hip pain that radiates down leg to the foot. Symptoms have been worsening.     HPI:  Discussed the use of AI scribe software for clinical note transcription with the patient, who gave verbal consent to proceed.  History of Present Illness Breanna Martinez is a 63 year old female with hip arthritis who presents with worsening left hip and leg pain.  She has had persistent left-sided hip pain for several weeks, located in the groin and under the buttock, radiating down the leg to the lower leg, and it is worsening. She denies recent injury, new back pain, bowel or bladder changes, leg swelling, or color change.  She was previously told she has hip arthritis  based on past x-rays and completed physical therapy for this, but the pain has persisted and worsened.   She works at the [employer] with a lot of walking, which she feels worsens her symptoms. She takes daily calcium and vitamin D  and monitors her activity because of type 2 diabetes.      ROS: Negative unless specifically indicated above in HPI.   Relevant past medical history reviewed and updated as indicated.   Allergies and medications reviewed and updated.  Current Medications[1]  Allergies[2]  Social History[3]   Objective:   Vitals:   07/20/24 1053  BP: 112/80  Pulse: 73  Resp: 16  Height: 5' 4 (1.626 m)  Weight: 135 lb (61.2 kg)  SpO2: 97%  BMI (Calculated): 23.16     Appears well, INAD Sclera anicteric Oral mucosa moist Normal resp effort and excursion.  Antalgic gait, no foot drop. + tenderness over the left groin, also over left piriformis + pain on external rotation none with SLR. Strong movement against resistance to the LLE with intact sensation to light touch. Calf supple and non-tender. No pretibial edema.      [1]  Current Outpatient Medications:    cyclobenzaprine  (FLEXERIL ) 5 MG tablet, Take 1 tablet (5 mg total) by mouth at bedtime., Disp: 14 tablet, Rfl: 0   denosumab  (PROLIA ) 60 MG/ML SOSY injection, Inject 60 mg into the skin every 6 (six) months., Disp: 1 mL, Rfl: 1   lidocaine  (SALONPAS PAIN RELIEVING) 4 %, Place 1 patch onto the skin daily., Disp: ,  Rfl:    meloxicam  (MOBIC ) 7.5 MG tablet, Take 1-2 tablets (7.5-15 mg total) by mouth daily., Disp: 60 tablet, Rfl: 0   metFORMIN  (GLUCOPHAGE ) 500 MG tablet, Take 1 tablet (500 mg total) by mouth daily with breakfast., Disp: 90 tablet, Rfl: 1   Multiple Vitamins-Minerals (MULTIVITAMIN ADULT EXTRA C PO), multivitamin  Take 1 tablet as directed by oral route, Disp: , Rfl:    pravastatin  (PRAVACHOL ) 40 MG tablet, Take 1 tablet (40 mg total) by mouth at bedtime., Disp: 90 tablet, Rfl: 3   Turmeric  (QC TUMERIC COMPLEX PO), Take by mouth., Disp: , Rfl:    Calcium Citrate (CITRACAL PO), Take by mouth. (Patient not taking: Reported on 07/20/2024), Disp: , Rfl:   Current Facility-Administered Medications:    [START ON 10/19/2024] denosumab  (PROLIA ) injection 60 mg, 60 mg, Subcutaneous, Once, [name], [name], NP   ketorolac  (TORADOL ) injection 30 mg, 30 mg, Intramuscular, Once,  [2]  Allergies Allergen Reactions   Penicillins Anaphylaxis  [3]  Social History Tobacco Use   Smoking status: Never   Smokeless tobacco: Never  Vaping Use   Vaping status: Never Used  Substance Use Topics   Alcohol use: Yes    Comment: socially    Drug use: No

## 2024-07-27 ENCOUNTER — Encounter: Payer: Self-pay | Admitting: Nurse Practitioner

## 2024-07-28 NOTE — Telephone Encounter (Signed)
 Called patient this morning to ask what was missing form the forms. Patient informed me that there is a section that was filled in with provider printed name and the office address. I pulled up a copy of the FMLA forms and

## 2024-07-29 ENCOUNTER — Ambulatory Visit: Payer: Self-pay

## 2024-07-29 ENCOUNTER — Ambulatory Visit: Admitting: Sports Medicine

## 2024-07-29 ENCOUNTER — Ambulatory Visit: Admitting: Orthopaedic Surgery

## 2024-07-29 ENCOUNTER — Other Ambulatory Visit: Payer: Self-pay

## 2024-07-29 ENCOUNTER — Encounter: Payer: Self-pay | Admitting: Sports Medicine

## 2024-07-29 DIAGNOSIS — M1612 Unilateral primary osteoarthritis, left hip: Secondary | ICD-10-CM | POA: Diagnosis not present

## 2024-07-29 MED ORDER — LIDOCAINE HCL 1 % IJ SOLN
4.0000 mL | INTRAMUSCULAR | Status: AC | PRN
  Administered 2024-07-29: 4 mL

## 2024-07-29 MED ORDER — METHYLPREDNISOLONE ACETATE 40 MG/ML IJ SUSP
60.0000 mg | INTRAMUSCULAR | Status: AC | PRN
  Administered 2024-07-29: 60 mg via INTRA_ARTICULAR

## 2024-07-29 NOTE — Progress Notes (Signed)
" ° °  Procedure Note  Patient: Breanna Martinez             Date of Birth: 11-24-1960           MRN: [id number]             Visit Date: 07/29/2024  Procedures: Visit Diagnoses:  1. Primary osteoarthritis of left hip    Large Joint Inj: L hip joint on 07/29/2024 11:16 AM Indications: pain Details: 22 G 3.5 in needle, ultrasound-guided anterior approach Medications: 4 mL lidocaine  1 %; 60 mg methylPREDNISolone  acetate 40 MG/ML Outcome: tolerated well, no immediate complications  Procedure: US -guided intra-articular hip injection, left After discussion on risks/benefits/indications and informed verbal consent was obtained, a timeout was performed. Patient was lying supine on exam table. The hip was cleaned with betadine and alcohol swabs. Then utilizing ultrasound guidance, the patient's femoral head and neck junction was identified and subsequently injected with 4:1.5 lidocaine :depomedrol via an in-plane approach with ultrasound visualization of the injectate administered into the hip joint. Patient tolerated procedure well without immediate complications.  Procedure, treatment alternatives, risks and benefits explained, specific risks discussed. Consent was given by the patient. Immediately prior to procedure a time out was called to verify the correct patient, procedure, equipment, support staff and site/side marked as required. Patient was prepped and draped in the usual sterile fashion.     - patient tolerated procedure well, discussed post-injection protocol - follow-up with Dr. Jerri as indicated; I am happy to see them as needed  Lonell Sprang, DO Primary Care Sports Medicine Physician  [facility] - Orthoped[facility]  This note was dictated using Dragon naturally speaking software and may contain errors in syntax, spelling, or content which have not been identified prior to signing this note.     "

## 2024-07-29 NOTE — Progress Notes (Signed)
 "  Office Visit Note   Patient: Breanna Martinez           Date of Birth: 1960/08/16           MRN: [id number] Visit Date: 07/29/2024              Requested by: Waddell Krabbe, PA-C [id number] [place],  [place] [address] PCP: Katheen [name], NP   Assessment & Plan: Visit Diagnoses:  1. Primary osteoarthritis of left hip     Plan: History of Present Illness Breanna Martinez is a 63 year old female with bilateral hip osteoarthritis who presents for evaluation of chronic left hip pain.  She has had intermittent left hip pain for several months with recent worsening. The pain is localized to the left hip and groin and radiates down the thigh and occasionally more distally.  She has not received a steroid injection in the left hip. She recalls a prior torsemide injection, though details are unclear, and has not had other interventions for this hip pain.  Physical Exam MUSCULOSKELETAL: Left hip pain on flexion and internal rotation.  Results Radiology Pelvis X-ray (07/29/2024): Bilateral hip osteoarthritic changes, greater on the left; left hip osteophyte formation (Independently interpreted)  Assessment and Plan Primary osteoarthritis of left hip Chronic, progressive osteoarthritis of the left hip with advanced degenerative changes and osteophyte formation. Symptoms worsening with increased pain and functional limitation.  - Referred to Dr. Burnetta for corticosteroid injection in the left hip for temporary analgesia, benefit up to six months. - Discussed option for repeat corticosteroid injections as needed to manage symptoms and potentially delay surgical intervention. - Recommended physical therapy to improve flexibility and strength. - Discussed that hip replacement may be indicated in 2-3 years depending on clinical progression.  Follow-Up Instructions: No follow-ups on file.   Orders:  Orders Placed This Encounter  Procedures   XR HIP UNILAT W OR W/O  PELVIS 2-3 VIEWS LEFT   No orders of the defined types were placed in this encounter.     Procedures: No procedures performed   Clinical Data: No additional findings.   Subjective: Chief Complaint  Patient presents with   Left Hip - Pain    HPI  Review of Systems  Constitutional: Negative.   HENT: Negative.    Eyes: Negative.   Respiratory: Negative.    Cardiovascular: Negative.   Endocrine: Negative.   Musculoskeletal: Negative.   Neurological: Negative.   Hematological: Negative.   Psychiatric/Behavioral: Negative.    All other systems reviewed and are negative.    Objective: Vital Signs: There were no vitals taken for this visit.  Physical Exam Vitals and nursing note reviewed.  Constitutional:      Appearance: She is well-developed.  HENT:     Head: Atraumatic.     Nose: Nose normal.  Eyes:     Extraocular Movements: Extraocular movements intact.  Cardiovascular:     Pulses: Normal pulses.  Pulmonary:     Effort: Pulmonary effort is normal.  Abdominal:     Palpations: Abdomen is soft.  Musculoskeletal:     Cervical back: Neck supple.  Skin:    General: Skin is warm.     Capillary Refill: Capillary refill takes less than 2 seconds.  Neurological:     Mental Status: She is alert. Mental status is at baseline.  Psychiatric:        Behavior: Behavior normal.        Thought Content: Thought content  normal.        Judgment: Judgment normal.     Ortho Exam  Specialty Comments:  No specialty comments available.  Imaging: No results found.   PMFS History: Patient Active Problem List   Diagnosis Date Noted   Community acquired pneumonia of left upper lobe of lung 01/27/2024   Chronic pansinusitis 12/25/2023   Abnormal Pap smear of cervix 10/14/2023   Well woman exam with routine gynecological exam 09/25/2023   Chronic right shoulder pain 08/22/2023   Chronic insomnia 05/22/2023   Hyperlipidemia associated with type 2 diabetes mellitus  (HCC) 05/22/2023   Hematochezia 06/01/2022   Vitreomacular adhesion of both eyes 03/20/2022   Nuclear sclerotic cataract of both eyes 03/20/2022   Localized primary osteoarthritis of carpometacarpal (CMC) joint of right wrist 02/15/2022   Chronic left hip pain 02/15/2022   Vitamin D  deficiency 11/06/2021   Pain in toes of both feet 11/06/2021   Osteoporosis 02/19/2016   DM (diabetes mellitus) (HCC) 10/15/2015   Past Medical History:  Diagnosis Date   Allergy penicillin   seasonal allergies   Osteoporosis     Family History  Problem Relation Age of Onset   Diabetes Mother    Glaucoma Mother    Prostate cancer Father    Asthma Sister    Colon cancer Neg Hx    Colon polyps Neg Hx    Esophageal cancer Neg Hx    Rectal cancer Neg Hx    Stomach cancer Neg Hx     Past Surgical History:  Procedure Laterality Date   BREAST BIOPSY Left 07/10/2021   fibroadenoma   BREAST BIOPSY Right 07/10/2024   US  RT BREAST BX W LOC DEV 1ST LESION IMG BX SPEC US  GUIDE 07/10/2024 [facility]   rotator cuff surgery     TOE SURGERY     Social History   Occupational History   Not on file  Tobacco Use   Smoking status: Never   Smokeless tobacco: Never  Vaping Use   Vaping status: Never Used  Substance and Sexual Activity   Alcohol use: Yes    Comment: socially    Drug use: No   Sexual activity: Not Currently    Birth control/protection: Post-menopausal        "

## 2024-08-12 ENCOUNTER — Encounter: Payer: Self-pay | Admitting: Obstetrics and Gynecology

## 2024-08-12 ENCOUNTER — Encounter: Payer: Self-pay | Admitting: Nurse Practitioner

## 2024-08-12 ENCOUNTER — Telehealth: Payer: Self-pay

## 2024-08-12 NOTE — Telephone Encounter (Signed)
 Patient due for Prolia  in March. Will verify benefits and submit PA before next injection.

## 2024-08-12 NOTE — Telephone Encounter (Signed)
 Per MyChart message from patient. Insurance letter mailed informing of Prolia  renewal is needed and due to expire on 09/21/24

## 2024-08-12 NOTE — Telephone Encounter (Signed)
 Last AEX 09/25/23 -[facility] Next 09/28/24 -[facility]  Dr. Dallie -see Breast imaging in EPIC and advise on Breast MRI recommendations.

## 2024-08-27 ENCOUNTER — Other Ambulatory Visit: Payer: Self-pay | Admitting: Nurse Practitioner

## 2024-08-27 DIAGNOSIS — M62838 Other muscle spasm: Secondary | ICD-10-CM

## 2024-08-27 NOTE — Telephone Encounter (Signed)
 Called patient to ask if she requested a refill on naproxen  500 mg to be taken as needed. She stated that she does not need that. I informed her that she should no longer be taking that medication as Corean Geralds, PA-C discontinued that medication back on 07/20/24 when she had the appointment and given a Rx for Mobic  7.5 mg. She stated that she is taking the mobic  and that she called her pharmacy telling them she needed  refill on her Metformin .

## 2024-09-07 ENCOUNTER — Other Ambulatory Visit: Payer: Self-pay | Admitting: Nurse Practitioner

## 2024-09-07 DIAGNOSIS — M810 Age-related osteoporosis without current pathological fracture: Secondary | ICD-10-CM

## 2024-09-10 ENCOUNTER — Telehealth: Payer: Self-pay

## 2024-09-10 ENCOUNTER — Other Ambulatory Visit (HOSPITAL_COMMUNITY): Payer: Self-pay

## 2024-09-10 NOTE — Telephone Encounter (Signed)
 SABRA

## 2024-09-10 NOTE — Telephone Encounter (Signed)
 Prolia  VOB initiated via [email or web address]  Next Prolia  inj DUE: 10/08/24

## 2024-09-10 NOTE — Telephone Encounter (Signed)
 Pt ready for scheduling for PROLIA  on or after : 10/08/24  Option# 1: Buy/Bill (Office supplied medication)  Out-of-pocket cost due at time of clinic visit: $100  Number of injection/visits approved: ---  Primary: [employer] Prolia  co-insurance: $50 Admin fee co-insurance: $50  Secondary: --- Prolia  co-insurance:  Admin fee co-insurance:   Medical Benefit Details: Date Benefits were checked: 09/10/24 Deductible: NO/ Coinsurance: $50/ Admin Fee: $50  Prior Auth: N/A PA# Expiration Date:   # of doses approved: ----------------------------------------------------------------------- Option# 2- Med Obtained from pharmacy:  Pharmacy benefit: Copay [phone] (Paid to pharmacy) Admin Fee: $50 (Pay at clinic)  Prior Auth: APPROVED PA#  Expiration Date: 09/25/23-09/24/24  # of doses approved: 2   If patient wants fill through the pharmacy benefit please send prescription to: [facility], and include estimated need by date in rx notes. Pharmacy will ship medication directly to the office.  Patient IS eligible for Prolia  Copay Card. Copay Card can make patient's cost as little as $25. Link to apply: [email or web address]  ** This summary of benefits is an estimation of the patient's out-of-pocket cost. Exact cost may very based on individual plan coverage.

## 2024-09-15 ENCOUNTER — Ambulatory Visit: Admitting: "Endocrinology

## 2024-09-28 ENCOUNTER — Ambulatory Visit: Admitting: Obstetrics and Gynecology
